# Patient Record
Sex: Male | Born: 1950 | Race: White | Hispanic: No | Marital: Married | Smoking: Former smoker
Health system: Southern US, Community
[De-identification: ages and names within clinical notes are randomized; demographics above are authoritative.]

## PROBLEM LIST (undated history)

## (undated) DIAGNOSIS — M1990 Unspecified osteoarthritis, unspecified site: Secondary | ICD-10-CM

## (undated) DIAGNOSIS — I10 Essential (primary) hypertension: Secondary | ICD-10-CM

## (undated) DIAGNOSIS — J302 Other seasonal allergic rhinitis: Secondary | ICD-10-CM

## (undated) DIAGNOSIS — K219 Gastro-esophageal reflux disease without esophagitis: Secondary | ICD-10-CM

## (undated) DIAGNOSIS — N433 Hydrocele, unspecified: Secondary | ICD-10-CM

## (undated) HISTORY — PX: BACK SURGERY: SHX140

## (undated) HISTORY — PX: APPENDECTOMY: SHX54

## (undated) HISTORY — PX: SEPTOPLASTY: SUR1290

## (undated) HISTORY — PX: DIAGNOSTIC LAPAROSCOPY: SUR761

## (undated) HISTORY — PX: HERNIA REPAIR: SHX51

---
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
## 1993-04-03 HISTORY — PX: CERVICAL DISC SURGERY: SHX588

## 1995-01-16 DIAGNOSIS — C4491 Basal cell carcinoma of skin, unspecified: Secondary | ICD-10-CM

## 1995-01-16 HISTORY — DX: Basal cell carcinoma of skin, unspecified: C44.91

## 2001-04-19 ENCOUNTER — Ambulatory Visit (HOSPITAL_COMMUNITY): Admission: RE | Admit: 2001-04-19 | Discharge: 2001-04-19 | Payer: Self-pay | Admitting: Gastroenterology

## 2002-10-13 DIAGNOSIS — C4491 Basal cell carcinoma of skin, unspecified: Secondary | ICD-10-CM

## 2002-10-13 HISTORY — DX: Basal cell carcinoma of skin, unspecified: C44.91

## 2004-04-20 DIAGNOSIS — D229 Melanocytic nevi, unspecified: Secondary | ICD-10-CM

## 2004-04-20 HISTORY — DX: Melanocytic nevi, unspecified: D22.9

## 2005-04-01 ENCOUNTER — Emergency Department (HOSPITAL_COMMUNITY): Admission: EM | Admit: 2005-04-01 | Discharge: 2005-04-01 | Payer: Self-pay | Admitting: Emergency Medicine

## 2006-04-03 HISTORY — PX: LUMBAR LAMINECTOMY: SHX95

## 2006-04-07 ENCOUNTER — Ambulatory Visit (HOSPITAL_COMMUNITY): Admission: RE | Admit: 2006-04-07 | Discharge: 2006-04-07 | Payer: Self-pay | Admitting: Gastroenterology

## 2006-12-25 ENCOUNTER — Ambulatory Visit (HOSPITAL_COMMUNITY): Admission: RE | Admit: 2006-12-25 | Discharge: 2006-12-26 | Payer: Self-pay | Admitting: Neurosurgery

## 2007-03-04 ENCOUNTER — Inpatient Hospital Stay (HOSPITAL_COMMUNITY): Admission: EM | Admit: 2007-03-04 | Discharge: 2007-03-05 | Payer: Self-pay | Admitting: Emergency Medicine

## 2008-02-12 ENCOUNTER — Encounter: Admission: RE | Admit: 2008-02-12 | Discharge: 2008-02-12 | Payer: Self-pay | Admitting: Gastroenterology

## 2008-04-03 HISTORY — PX: KNEE ARTHROSCOPY: SHX127

## 2008-04-05 ENCOUNTER — Emergency Department (HOSPITAL_COMMUNITY): Admission: EM | Admit: 2008-04-05 | Discharge: 2008-04-05 | Payer: Self-pay | Admitting: Family Medicine

## 2008-08-02 ENCOUNTER — Emergency Department (HOSPITAL_COMMUNITY): Admission: EM | Admit: 2008-08-02 | Discharge: 2008-08-02 | Payer: Self-pay | Admitting: Emergency Medicine

## 2008-11-21 ENCOUNTER — Encounter: Admission: RE | Admit: 2008-11-21 | Discharge: 2008-11-21 | Payer: Self-pay | Admitting: Gastroenterology

## 2008-12-24 ENCOUNTER — Ambulatory Visit (HOSPITAL_COMMUNITY): Admission: RE | Admit: 2008-12-24 | Discharge: 2008-12-24 | Payer: Self-pay | Admitting: Gastroenterology

## 2009-09-02 ENCOUNTER — Ambulatory Visit: Payer: Self-pay | Admitting: Vascular Surgery

## 2009-09-02 ENCOUNTER — Ambulatory Visit (HOSPITAL_COMMUNITY): Admission: RE | Admit: 2009-09-02 | Discharge: 2009-09-02 | Payer: Self-pay | Admitting: Orthopedic Surgery

## 2009-09-02 ENCOUNTER — Encounter (INDEPENDENT_AMBULATORY_CARE_PROVIDER_SITE_OTHER): Payer: Self-pay | Admitting: Orthopedic Surgery

## 2010-03-30 ENCOUNTER — Encounter
Admission: RE | Admit: 2010-03-30 | Discharge: 2010-03-30 | Payer: Self-pay | Source: Home / Self Care | Attending: Internal Medicine | Admitting: Internal Medicine

## 2010-08-16 NOTE — H&P (Signed)
Adrian [name], Adrian [name]                  ACCOUNT NO.:  [id number]   MEDICAL RECORD NO.:  [id number]          PATIENT TYPE:  OIB   LOCATION:  3036                         FACILITY:  [facility]   PHYSICIAN:  Payton Doughty, M.D.      DATE OF BIRTH:  Aug 02, 1950   DATE OF ADMISSION:  12/25/2006  DATE OF DISCHARGE:                              HISTORY & PHYSICAL   ADMISSION DIAGNOSIS:  Herniated disk at L3-4.   SERVICE:  Neurosurgery.   A very nice 60 year old left-handed white gentleman had back pain off  and on for a bit.  He has had a lumbar diskectomy done by Dr. Elesa Hacker at  5-1 in the remote past.  Starting in June he was doing a lot of work,  got on a plane, landed in [place] with discomfort down his right  leg.  Used oral steroids, had a couple of epidural injections for a 3-4  disk on the right, continues to have leg pain, and is now admitted for  diskectomy.   MEDICAL HISTORY:  Otherwise benign.  He is not any medications  save for  a Medrol Dosepak.  He took hydrocodone and Flexeril.   ALLERGIES:  None.   SURGICAL HISTORY:  Lumbar diskectomy.   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   FAMILY HISTORY:  None germane.   REVIEW OF SYSTEMS:  Remarkable for back and leg pain.   HEENT EXAM:  Within normal limits.  He has reasonable range of motion of  his neck.  CHEST:  Clear.  CARDIAC:  Regular rate and rhythm.  ABDOMEN:  Nontender with no hepatosplenomegaly.  EXTREMITIES:  Without clubbing or cyanosis.  GENITOURINARY:  Deferred.  PERIPHERAL PULSES:  Good.  NEUROLOGICAL:  He is awake, alert and oriented.  His cranial nerves are  intact.  Motor exam shows 5/5 strength throughout the upper and lower  extremities save for the dorsiflexors of the right foot, they are 4/5.  Sensory deficit described in the right L4 and L5 distribution.  Reflexes  are flicker at the left knee, absent at the right knee, flicker at the  right ankle,  absent at the left ankle.  Straight leg raise is negative  on the right.   MR demonstrates spondylosis at 4-5 and 5-1, but he also has a 3-4 disk  up on the right side behind the body of 3.   CLINICAL IMPRESSION:  Right-sided L4 and L5 radiculopathy secondary to 3-  4 disk.   The plan is for a right L3-4 laminectomy/diskectomy.  The risks and  benefits have been discussed with him and he wished to proceed.           ______________________________  Payton Doughty, M.D.     MWR/MEDQ  D:  12/25/2006  T:  12/26/2006  Job:  [id number]

## 2010-08-16 NOTE — H&P (Signed)
Adrian [name], Adrian [name]                  ACCOUNT NO.:  [id number]   MEDICAL RECORD NO.:  [id number]          PATIENT TYPE:  INP   LOCATION:  5712                         FACILITY:  [facility]   PHYSICIAN:  Kari Baars, M.D.  DATE OF BIRTH:  06-Feb-1951   DATE OF ADMISSION:  03/04/2007  DATE OF DISCHARGE:                              HISTORY & PHYSICAL   CHIEF COMPLAINT:  Swallowed 27% hydrogen peroxide.   HISTORY OF PRESENT ILLNESS:  Mr. Grieshop is a 60 year old white male with a  history of hypertension, chronic back pain secondary to degenerative  disk disease who presented to the emergency department today after  swallowing 27% hydrogen peroxide.  The patient states that he cleaned  his spa last evening with the hydrogen peroxide and thought he had  dumped the glass of liquid out.  This morning he took ibuprofen for his  back pain with a glass of clear liquid and experienced an explosion in  mouth.  Initially, he was not sure what he had drank but after EMS was  called they realized that it was the hydrogen peroxide that he had  failed to dispose of.  He was brought to the emergency department where  Dr. Ethelda Chick discussed the hydrogen peroxide exposure with Poison  Control.  They recommended evaluation for GI tract toxicity.  Dr. Kinnie Scales  was called and performed an endoscopy which revealed esophagitis and  fairly severe gastritis.  He recommended observation for 24 hours with  hydration and PPI therapy.  Currently, the patient is without complaint.  He states that his mouth feels numb as if it had been burned by hot  liquid.  He does not have any significant chest pain, abdominal pain,  nausea, vomiting or shortness of breath.   PAST MEDICAL HISTORY:  1. Degenerative disk disease status post L3-L4 diskectomy (September      2008) and L5-S1 diskectomy (1986) and cervical disk surgery in      1994.  2. Hypertension.  3. Hyperlipidemia.  4. Allergic rhinitis.  5. Status post  appendectomy (1999) complicated by postop hemorrhage.  6. Status post umbilical hernia repair.  7. Status post deviated septum repair.   CURRENT MEDICATIONS:  1. Diovan 80 mg daily.  2. Vicodin p.r.n.  3. Allegra 180 mg p.r.n.  4. Flonase 2 sprays daily p.r.n.   ALLERGIES:  No known drug allergies.   SOCIAL HISTORY:  He is married with two children.  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  He quit smoking in 1974.  Social alcohol use and  no illicit drug use.   FAMILY HISTORY:  Father died of leukemia at 80 and had a history of  hypertension and osteoarthritis.  His mother had breast cancer and died  at 61.  He has three brothers, one of whom had a stroke at 59.  His two  sons are healthy.   REVIEW OF SYSTEMS:  All systems reviewed with the patient are negative,  except in the HPI.   PHYSICAL  EXAMINATION:  VITAL SIGNS:  Temperature 97.8.  Pulse 86.  Respirations 20.  Blood pressure 133/96.  Oxygen saturation 96% on room  air.  GENERAL APPEARANCE:  A pleasant gentleman in no acute distress.  He is  alert and oriented x4.  HEENT:  Oropharynx is moist without erythema or apparent ulceration.  NECK:  Supple without lymphadenopathy, JVD or carotid bruits.  HEART:  Regular rate and rhythm without murmurs, rubs or gallops.  LUNGS:  Clear to auscultation bilaterally.  ABDOMEN:  Soft, nondistended and nontender with normoactive bowel  sounds.  EXTREMITIES:  No clubbing, cyanosis or edema.  No rash.   DIAGNOSTIC DATA:  Endoscopy report personally reviewed report and  pictures.  This reveals caustic esophagitis which is mild with erythema  expanding about 1 cm at the gastroesophageal junction.  In the stomach,  there is diffuse erythema and edema consistent with grade IIA gastritis.   ASSESSMENT AND PLAN:  1. Gastritis/esophagitis secondary to caustic burn from 27% hydrogen      peroxide:  We will admit for  24 observation to observe for      associated complications.  We will keep him n.p.o. with sips and      chips only.  We will place on Protonix 40 mg intravenous b.i.d. and      hydrate.  We will obtain an acute abdominal series to rule out      pulmonary or gastrointestinal related complications in the morning.      We will also check a CBC and a CMET in the morning.  2. Chronic back pain:  Morphine p.r.n. until he is tolerating p.o.  3. Disposition:  Anticipate discharge tomorrow if he is stable with no      apparent complications.  He will need follow up with Dr. Kinnie Scales in      the next couple of weeks to reevaluate and to rule out stricture      related to current abnormalities.      Kari Baars, M.D.  Electronically Signed     WS/MEDQ  D:  03/04/2007  T:  03/05/2007  Job:  [id number]   cc:   Griffith Citron, M.D.

## 2010-08-16 NOTE — Discharge Summary (Signed)
NAMEDANDRAE, KUSTRA                  ACCOUNT NO.:  [id number]   MEDICAL RECORD NO.:  [id number]          PATIENT TYPE:  INP   LOCATION:  5712                         FACILITY:  [facility]   PHYSICIAN:  Kari Baars, M.D.  DATE OF BIRTH:  1950-09-01   DATE OF ADMISSION:  03/04/2007  DATE OF DISCHARGE:  03/05/2007                               DISCHARGE SUMMARY   DISCHARGE DIAGNOSES:  1. Accidental hydrogen peroxide ingestion.  2. Esophagitis/gastritis secondary to #1.  3. Elevated bilirubin, question Gilberts' syndrome.  4. Hypertension   DISCHARGE MEDICATIONS:  1. Protonix 20 mg p.o. b.i.d. times 2 weeks, then 1 daily.  2. Phenergan 25 mg q.6 hours p.r.n. nausea.  3. Diovan 80 mg daily.  4. Hydrocodone/APAP 5/500 p.r.n. back pain.  5. Allegra 200 mg daily.   HISTORY OF PRESENT ILLNESS:  For full details, please see dictated  history and physical.  Briefly, Mr. Capek is a 60 year old white male  with history of hypertension and chronic back pain secondary to  degenerative disk disease status post multiple surgeries who presented  to the emergency department on March 04, 2007 following an accidental  ingestion of 27% hydrogen peroxide.  He states that he was cleaning his  spa on the night prior to the ingestion and had a glass of hydrogen  peroxide.  The following morning when taking an ibuprofen, he  experienced an explosion in his mouth.  He quickly realized that it  was by the hydrogen peroxide that he had ingested, about 2 mouthfuls.  He presented to the emergency department and after evaluation by Dr.  Ethelda Chick in the emergency department Dr. Kinnie Scales was consulted to  evaluate for caustic GI toxicity.  He underwent an endoscopy by Dr.  Kinnie Scales that showed esophageal inflammation at the distal  gastroesophageal junction about 1 cm with edema, which was mild.  In  addition, he had diffuse grade 2A gastritis throughout the fundus to  antrum.  It was recommended that he be  monitored overnight for  additional complications.   HOSPITAL COURSE:  The patient was admitted to a medical bed.  He was  placed on bowel rest overnight and a clear liquid diet on the morning of  discharge.  He tolerated this well without any significant abdominal  pain.  He did have a very mild discomfort in the epigastrium.  He has  had three bowel movements, the last of which was dark per the patient.  CBC done on the morning of discharge shows a hemoglobin of 13.7.  An  acute abdominal series was obtained to rule out aspiration pneumonitis  and showed no acute cardiopulmonary disease.  There were no other  concerning features on the abdominal folds.  At this point, the patient  is stable for discharge home to continue a clear liquid diet and PPI  therapy.  He will follow up with Dr. Kinnie Scales for repeat endoscopy in 2  weeks.   DISCHARGE INSTRUCTIONS:  He was instructed to call if he has increasing  abdominal pain, nausea, vomiting, hematemesis, persistent melena, or  shortness of breath.  LABORATORY DATA:  Discharge labs:  CBC on the day of discharge shows a  white count of 9.0, hemoglobin 13.7, platelets 252.  CMET shows sodium  141, potassium 3.4, chloride 99, bicarb 26, BUN 6, creatinine 0.76,  glucose 101.  Total bilirubin 2.3, alkaline phosphatase 46, ALT 33, AST  23.   HOSPITAL FOLLOWUP:  He will follow up with Dr. Kinnie Scales in 2 weeks.  He  will follow up with Dr. Clelia Croft in 1 week for repeat CBC and CMET.   DISCHARGE DIET:  Clear liquid diet, advance very slowly as tolerated.      Kari Baars, M.D.  Electronically Signed     WS/MEDQ  D:  03/05/2007  T:  03/05/2007  Job:  [id number]   cc:   Griffith Citron, M.D.

## 2010-08-16 NOTE — Op Note (Signed)
NAMEZIARE, CRYDER                  ACCOUNT NO.:  [id number]   MEDICAL RECORD NO.:  [id number]          PATIENT TYPE:  AMB   LOCATION:  SDS                          FACILITY:  [facility]   PHYSICIAN:  Payton Doughty, M.D.      DATE OF BIRTH:  [birth year]   DATE OF PROCEDURE:  12/25/2006  DATE OF DISCHARGE:                               OPERATIVE REPORT   PREOPERATIVE DIAGNOSIS:  Herniated disk on the right side at L3-4.   POSTOPERATIVE DIAGNOSIS:  Herniated disk on the right side at L3-4.   OPERATIVE PROCEDURE:  Laminectomy diskectomy on the right side at L3-4.   SURGEON:  Payton Doughty, M.D.   ANESTHESIA:  General endotracheal.   PREP:  Sterile Betadine prep and scrub with alcohol wipe.   COMPLICATIONS:  None.   NURSE ASSISTANT:  [place].   BODY OF TEXT:  This is a 60 year old gentleman with disk at 3-4 of the  right.  Taken to operating room, smoothly anesthetized, intubated,  placed prone on the operating table.  Following shave, prep, draped in  usual sterile fashion, needle was placed as a marker and x-ray obtained  to guide incision making.  Incision was made directly over the L3 lamina  and the lamina, 3-4 and 2-3 facet joints and the pars interarticularis  of L3 were identified.  Hook was placed in the L3 neural foramen which  is where the disk was located on the MRI.  X-ray was taken to confirm  correctness of level.  Having confirmed correctness level, working  laterally over the pars and into the lateral portion of the lamina, the  bone was drilled away and the ligamentum flavum uncovered.  Ligamentum  was removed.  The fragmented disk was immediately under the ligament  just medial to the most medial portion of the L3 root.  Disk was removed  without difficulty.  The root carefully explored and found to be free.  Wound was irrigated.  Hemostasis assured.  Depo-Medrol soaked fat used  to fill the laminectomy defect.  Successive layers of 0 Vicryl, 2-0  Vicryl, 4-0 Vicryl  used to close.  Benzoin, Steri-Strips were placed,  made occlusive with Telfa and OpSite and the patient returned to  recovery room in good condition.           ______________________________  Payton Doughty, M.D.     MWR/MEDQ  D:  12/25/2006  T:  12/26/2006  Job:  [id number]

## 2011-01-09 LAB — COMPREHENSIVE METABOLIC PANEL
ALT: 33
Alkaline Phosphatase: 46
BUN: 6
CO2: 26
Calcium: 8.8
GFR calc non Af Amer: >60
Glucose, Bld: 101 — ABNORMAL HIGH
Sodium: 141

## 2011-01-09 LAB — CBC
HCT: 39.9
Hemoglobin: 13.7
MCHC: 34.5
RBC: 4.54

## 2011-01-12 LAB — DIFFERENTIAL
Basophils Absolute: 0
Basophils Relative: 1
Eosinophils Relative: 2
Monocytes Absolute: 0.5

## 2011-01-12 LAB — COMPREHENSIVE METABOLIC PANEL
ALT: 24
AST: 22
Albumin: 3.6
Alkaline Phosphatase: 43
Chloride: 107
GFR calc Af Amer: >60
Potassium: 4.3
Total Bilirubin: 0.9

## 2011-01-12 LAB — CBC
HCT: 39.7
Platelets: 282
WBC: 5.5

## 2011-01-12 LAB — URINALYSIS, ROUTINE W REFLEX MICROSCOPIC
Bilirubin Urine: NEGATIVE
Glucose, UA: NEGATIVE
Ketones, ur: NEGATIVE
pH: 7

## 2011-01-12 LAB — ABO/RH: ABO/RH(D): O POS

## 2011-01-12 LAB — TYPE AND SCREEN

## 2011-05-17 ENCOUNTER — Encounter (HOSPITAL_BASED_OUTPATIENT_CLINIC_OR_DEPARTMENT_OTHER): Payer: Self-pay | Admitting: *Deleted

## 2011-05-17 NOTE — H&P (Signed)
[name] [address]   [address] [place], [place] [address] [phone] A Division of [facility]  Adrian [name], M.D.     Adrian [name], M.D.     Adrian [name], M.D. Adrian [name], M.D.    Adrian [name], M.D. Adrian [name], M.D. Adrian [name], D.O.          Adrian [name]. Adrian Dienes, PA-C            Adrian A. Shepperson, PA-C Adrian [name], OPA-C   RE: Adrian [name], Adrian [name]                                [id number]      DOB: Jan 13, 1951 PROGRESS NOTE: 04-25-11 SUBJECTIVE: Adrian [name] is a 61 year-old gentleman who comes into the office with complaints of neck and upper back pain.  Chronic back pain for many years.  His other complaint is with his right knee.  Symptoms there for two days after he went on a hike over the weekend.  No previous problems with the knee.  Generalized discomfort with the knee.  It is most troublesome to the inner side of his knee.  No catching or popping.  No swelling.  No giving way.  No instability.  No hip pain.  His more chronic complaint is neck and upper back pain that [name] been troublesome for many years.  It [name] been troublesome over the past several weeks with intermittent burning and radiating discomfort down each arm with tingling, particularly on the right.  No weakness.  Despite Ibuprofen, ice and/or heat, his symptoms have persisted.  Mild headache as well.  No previous treatment for his neck, but he does recall having physical therapy for his back at one point many years ago.  His medical history upon review shows he [name] no allergies.  No history of diabetes.  He [name] a history of hypertension.  His only medications being Diovan once daily.  Past surgical history is significant for lumbar diskectomy x 2, cervical diskectomy in the mid 1990's, left knee arthroscopy in 2010 and appendectomy.  Family history is significant for hypertension.  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      OBJECTIVE: Well-developed, well-nourished.  Height: 5?10.  Weight: 200 pounds.  Blood pressure: 160/82.  Exam of the cervical spine shows mild pain to flexion and extension.  Mildly restricted lateral rotation by 10% bilaterally with paracervical and trapezius spasm more prominent right than left.  Negative Spurling's to the left, mildly positive to the right.  Normal thoracic motion with normal scapular glide.  No midline tenderness to the cervical or thoracic spine.  He is neurovascularly intact upper extremities bilaterally.  Exam of the right knee shows tenderness over the medial joint line.  Positive medial McMurray's.  No effusion.  Stable to varus and valgus stress.  Negative Lachman.  No retropatellar grind.  He is neurovascularly intact distally.    X-RAYS: Three views of the right knee are unremarkable.  A paucity of underlying degenerative changes.  Two views of the cervical spine show marked diminished disc space height at C6-7.  Plain films of the thoracic spine are unremarkable.    ASSESSMENT: 1. Status [name] remote cervical diskectomy. 2. C6-7 DDD.  3. Bilateral cervical radiculopathy.  4. Right knee medial meniscus tear.   Continued  [name] [address]   [address] [place], [place] [address] [phone] A Division of [facility]  Adrian [name], M.D.     Adrian [name], M.D.     Adrian [name], M.D. Adrian [name], M.D.    Adrian [name], M.D. Adrian [name], M.D. Adrian [name], D.O.          Adrian [name]. Adrian Dienes, PA-C            Adrian A. Shepperson, PA-C Adrian [name], OPA-C   RE: Adrian [name], Adrian [name]                                [id number]      DOB: [birth year] PROGRESS NOTE: 04-25-11 PLAN: MRI of the cervical spine.  Intermittent Ibuprofen.  For his knee dilemma we discussed imaging studies, he wants to hold on that for now.  A 2:6 intraarticular injection with  Depo-Medrol/Marcaine is performed to the right knee.  Expected course after the injection explained to him.  Follow up after imaging studies to delineate further therapeutic recommendations.   PROCEDURE NOTE: The patient's clinical condition is marked by substantial pain and/or significant functional disability.  Other conservative therapy [name] not provided relief, is contraindicated, or not appropriate.  There is a reasonable likelihood that injection will significantly improve the patient's pain and/or functional disability. Patient is seated on the exam table.  The right knee is prepped with Betadine and alcohol and injected with 2:6 Depo-Medrol/Marcaine.  Patient tolerated the procedure without difficulty.   Adrian [name], M.D.   Electronically verified by Adrian [name], M.D. Adrian [name] D 04-26-11 T 04-27-11   [name] [address]   [address] [place], [place] [address] [phone] A Division of [facility]  Adrian [name], M.D.     Adrian [name], M.D.     Adrian [name], M.D. Adrian [name], M.D.    Adrian [name], M.D. Adrian [name], M.D. Adrian [name], D.O.          Adrian [name]. Adrian Dienes, PA-C            Adrian A. Shepperson, PA-C Adrian [name], OPA-C   RE: Adrian [name], Adrian [name]                                [id number]      DOB: November 22, 1950 PROGRESS NOTE: 05-16-11 Adrian [name] comes in for follow up.  Persistent significant symptoms, right knee.  Previously seen and evaluated by Dr. Farris [name].  Locking, popping and catching medial aspect.  Not bad as long as he is straight ahead, but anything with squatting and pivoting causes marked significant symptoms.  Previous x-rays which I have reviewed show some mild to moderate narrowing medial compartment, left knee.  The right looks well preserved in all compartments.  Left knee previously treated with arthroscopic debridement of meniscus tears by Dr. Thurston [name] back in 2011.  Although he  [name] some Grade II and III changes throughout the knee, he [name] done well with that debridement and really does not have significant symptoms there.  No real generalized arthritic symptoms on the right, but marked mechanical symptoms.  Recent MRI completed of the right knee which shows mild tricompartmental changes.  Posterior horn medial meniscus tear, as well as intrameniscal cyst involving the entire posterior third.  Intact ligaments.  Reactive effusion.  Some synovitis and medial patellar plica thickening.  I went over that report and scan and discussed it with him.   EXAMINATION: His general exam is outlined and included in the chart.  Specifically, point tender medial joint line on the right.  Positive McMurray's.  1+ reactive effusion.  Full motion, stable ligaments.  Neurovascularly intact distally.  On the left he [name] a little bit of varus with walking, but no joint line tenderness.  No meniscal signs.  Ligaments stable.     DISPOSITION:  Medial meniscus tear and meniscal cyst, right knee.  Medial plica.  Discussed definitive treatment.  Sufficient signs and symptoms to warrant that, especially mechanical in nature.  Exam under anesthesia, arthroscopy, care of his meniscus tear.  Anticipated procedure, risks, benefits, complications and recovered reviewed with him.  He works as an Tax inspector at [employer] and hopefully he can go back to work in as few as one week, but he realizes it is going to be a good 4-6 weeks before complete recovery.  He understands.  See him at the time of operative intervention.    Adrian [name], M.D.   Electronically verified by Adrian [name], M.D. DFM:jjh D 05-16-11 T 05-17-11

## 2011-05-17 NOTE — Progress Notes (Signed)
Called for pcp notes and ekg-needs istat am

## 2011-05-18 ENCOUNTER — Ambulatory Visit (HOSPITAL_BASED_OUTPATIENT_CLINIC_OR_DEPARTMENT_OTHER): Payer: 59 | Admitting: *Deleted

## 2011-05-18 ENCOUNTER — Encounter (HOSPITAL_BASED_OUTPATIENT_CLINIC_OR_DEPARTMENT_OTHER)
Admission: RE | Disposition: A | Discharge status: Home / Self Care | Payer: Self-pay | Source: Ambulatory Visit | Attending: Orthopedic Surgery

## 2011-05-18 ENCOUNTER — Ambulatory Visit (HOSPITAL_BASED_OUTPATIENT_CLINIC_OR_DEPARTMENT_OTHER)
Admission: RE | Admit: 2011-05-18 | Discharge: 2011-05-18 | Disposition: A | Discharge status: Home / Self Care | Payer: 59 | Source: Ambulatory Visit | Attending: Orthopedic Surgery | Admitting: Orthopedic Surgery

## 2011-05-18 ENCOUNTER — Encounter (HOSPITAL_BASED_OUTPATIENT_CLINIC_OR_DEPARTMENT_OTHER): Payer: Self-pay | Admitting: *Deleted

## 2011-05-18 DIAGNOSIS — K219 Gastro-esophageal reflux disease without esophagitis: Secondary | ICD-10-CM | POA: Insufficient documentation

## 2011-05-18 DIAGNOSIS — M23329 Other meniscus derangements, posterior horn of medial meniscus, unspecified knee: Secondary | ICD-10-CM | POA: Insufficient documentation

## 2011-05-18 DIAGNOSIS — M6750 Plica syndrome, unspecified knee: Secondary | ICD-10-CM | POA: Insufficient documentation

## 2011-05-18 DIAGNOSIS — Z4789 Encounter for other orthopedic aftercare: Secondary | ICD-10-CM

## 2011-05-18 DIAGNOSIS — I10 Essential (primary) hypertension: Secondary | ICD-10-CM | POA: Insufficient documentation

## 2011-05-18 HISTORY — DX: Essential (primary) hypertension: I10

## 2011-05-18 HISTORY — DX: Unspecified osteoarthritis, unspecified site: M19.90

## 2011-05-18 HISTORY — DX: Other seasonal allergic rhinitis: J30.2

## 2011-05-18 LAB — POCT I-STAT, CHEM 8
BUN: 15 mg/dL (ref 6–23)
Hemoglobin: 16 g/dL (ref 13.0–17.0)
Potassium: 4.2 mEq/L (ref 3.5–5.1)
Sodium: 140 mEq/L (ref 135–145)
TCO2: 19 mmol/L (ref 0–100)

## 2011-05-18 SURGERY — ARTHROSCOPY, KNEE, WITH MEDIAL MENISCECTOMY
Anesthesia: General | Site: Knee | Laterality: Right | Wound class: Clean

## 2011-05-18 MED ORDER — MORPHINE SULFATE 4 MG/ML IJ SOLN: 0.0500 mg/kg | INTRAMUSCULAR | Status: DC | PRN

## 2011-05-18 MED ORDER — PROPOFOL 10 MG/ML IV EMUL
INTRAVENOUS | Status: DC | PRN
  Administered 2011-05-18: 250 mg via INTRAVENOUS

## 2011-05-18 MED ORDER — LACTATED RINGERS IV SOLN
INTRAVENOUS | Status: DC
  Administered 2011-05-18 (×2): via INTRAVENOUS

## 2011-05-18 MED ORDER — ONDANSETRON HCL 4 MG/2ML IJ SOLN
INTRAMUSCULAR | Status: DC | PRN
  Administered 2011-05-18: 4 mg via INTRAVENOUS

## 2011-05-18 MED ORDER — CEFAZOLIN SODIUM 1-5 GM-% IV SOLN
INTRAVENOUS | Status: DC | PRN
  Administered 2011-05-18: 2 g via INTRAVENOUS

## 2011-05-18 MED ORDER — FENTANYL CITRATE 0.05 MG/ML IJ SOLN
INTRAMUSCULAR | Status: DC | PRN
  Administered 2011-05-18 (×2): 50 ug via INTRAVENOUS
  Administered 2011-05-18: 25 ug via INTRAVENOUS

## 2011-05-18 MED ORDER — METHYLPREDNISOLONE ACETATE PF 80 MG/ML IJ SUSP
INTRAMUSCULAR | Status: DC | PRN
  Administered 2011-05-18: 80 mg via INTRA_ARTICULAR

## 2011-05-18 MED ORDER — BUPIVACAINE HCL (PF) 0.5 % IJ SOLN
INTRAMUSCULAR | Status: DC | PRN
  Administered 2011-05-18: 20 mL via INTRA_ARTICULAR

## 2011-05-18 MED ORDER — MIDAZOLAM HCL 2 MG/2ML IJ SOLN: 0.5000 mg | INTRAMUSCULAR | Status: DC | PRN

## 2011-05-18 MED ORDER — DEXAMETHASONE SODIUM PHOSPHATE 4 MG/ML IJ SOLN
INTRAMUSCULAR | Status: DC | PRN
  Administered 2011-05-18: 10 mg via INTRAVENOUS

## 2011-05-18 MED ORDER — SODIUM CHLORIDE 0.9 % IR SOLN
Status: DC | PRN
  Administered 2011-05-18: 8000 mL

## 2011-05-18 MED ORDER — FENTANYL CITRATE 0.05 MG/ML IJ SOLN: 50.0000 ug | INTRAMUSCULAR | Status: DC | PRN

## 2011-05-18 MED ORDER — DROPERIDOL 2.5 MG/ML IJ SOLN
INTRAMUSCULAR | Status: DC | PRN
  Administered 2011-05-18: 0.625 mg via INTRAVENOUS

## 2011-05-18 MED ORDER — LIDOCAINE HCL (CARDIAC) 20 MG/ML IV SOLN
INTRAVENOUS | Status: DC | PRN
  Administered 2011-05-18: 40 mg via INTRAVENOUS

## 2011-05-18 MED ORDER — ACETAMINOPHEN 10 MG/ML IV SOLN
1000.0000 mg | Freq: Once | INTRAVENOUS | Status: AC
  Administered 2011-05-18: 1000 mg via INTRAVENOUS

## 2011-05-18 MED ORDER — OXYCODONE HCL 5 MG PO TABS
10.0000 mg | ORAL_TABLET | ORAL | Status: AC | PRN
  Administered 2011-05-18: 10 mg via ORAL

## 2011-05-18 MED ORDER — METOCLOPRAMIDE HCL 5 MG/ML IJ SOLN: 10.0000 mg | Freq: Once | INTRAMUSCULAR | Status: DC | PRN

## 2011-05-18 MED ORDER — FENTANYL CITRATE 0.05 MG/ML IJ SOLN
25.0000 ug | INTRAMUSCULAR | Status: DC | PRN
  Administered 2011-05-18 (×2): 50 ug via INTRAVENOUS

## 2011-05-18 SURGICAL SUPPLY — 39 items
BANDAGE ELASTIC 6 VELCRO ST LF (GAUZE/BANDAGES/DRESSINGS) ×2 IMPLANT
BLADE CUDA 5.5 (BLADE) IMPLANT
BLADE CUDA GRT WHITE 3.5 (BLADE) IMPLANT
BLADE CUTTER GATOR 3.5 (BLADE) ×2 IMPLANT
BLADE CUTTER MENIS 5.5 (BLADE) IMPLANT
BLADE GREAT WHITE 4.2 (BLADE) ×2 IMPLANT
BUR OVAL 4.0 (BURR) IMPLANT
CANISTER OMNI JUG 16 LITER (MISCELLANEOUS) ×2 IMPLANT
CANISTER SUCTION 2500CC (MISCELLANEOUS) IMPLANT
CLOTH BEACON ORANGE TIMEOUT ST (SAFETY) ×2 IMPLANT
CUTTER MENISCUS  4.2MM (BLADE)
CUTTER MENISCUS 4.2MM (BLADE) IMPLANT
DRAPE ARTHROSCOPY W/POUCH 90 (DRAPES) ×2 IMPLANT
DRSG PAD ABDOMINAL 8X10 ST (GAUZE/BANDAGES/DRESSINGS) ×1 IMPLANT
DURAPREP 26ML APPLICATOR (WOUND CARE) ×2 IMPLANT
ELECT MENISCUS 165MM 90D (ELECTRODE) ×1 IMPLANT
ELECT REM PT RETURN 9FT ADLT (ELECTROSURGICAL) ×2
ELECTRODE REM PT RTRN 9FT ADLT (ELECTROSURGICAL) IMPLANT
GAUZE XEROFORM 1X8 LF (GAUZE/BANDAGES/DRESSINGS) ×2 IMPLANT
GLOVE BIO SURGEON STRL SZ 6.5 (GLOVE) ×1 IMPLANT
GLOVE BIOGEL PI IND STRL 7.0 (GLOVE) IMPLANT
GLOVE BIOGEL PI IND STRL 8 (GLOVE) ×1 IMPLANT
GLOVE BIOGEL PI INDICATOR 7.0 (GLOVE) ×1
GLOVE BIOGEL PI INDICATOR 8 (GLOVE) ×1
GLOVE ORTHO TXT STRL SZ7.5 (GLOVE) ×4 IMPLANT
GOWN PREVENTION PLUS XLARGE (GOWN DISPOSABLE) ×3 IMPLANT
GOWN STRL REIN 2XL XLG LVL4 (GOWN DISPOSABLE) ×1 IMPLANT
HOLDER KNEE FOAM BLUE (MISCELLANEOUS) ×2 IMPLANT
KNEE WRAP E Z 3 GEL PACK (MISCELLANEOUS) ×1 IMPLANT
PACK ARTHROSCOPY DSU (CUSTOM PROCEDURE TRAY) ×2 IMPLANT
PACK BASIN DAY SURGERY FS (CUSTOM PROCEDURE TRAY) ×2 IMPLANT
PENCIL BUTTON HOLSTER BLD 10FT (ELECTRODE) ×1 IMPLANT
SET ARTHROSCOPY TUBING (MISCELLANEOUS) ×2
SET ARTHROSCOPY TUBING LN (MISCELLANEOUS) ×1 IMPLANT
SPONGE GAUZE 4X4 12PLY (GAUZE/BANDAGES/DRESSINGS) ×3 IMPLANT
SUT ETHILON 3 0 PS 1 (SUTURE) ×2 IMPLANT
SUT VIC AB 3-0 FS2 27 (SUTURE) IMPLANT
TOWEL OR 17X24 6PK STRL BLUE (TOWEL DISPOSABLE) ×2 IMPLANT
WATER STERILE IRR 1000ML POUR (IV SOLUTION) ×2 IMPLANT

## 2011-05-18 NOTE — Anesthesia Postprocedure Evaluation (Signed)
  Anesthesia Post-op Note  Patient: Adrian [name]  Procedure(s) Performed: Procedure(s) (LRB): KNEE ARTHROSCOPY WITH MEDIAL MENISECTOMY (Right)  Patient Location: PACU  Anesthesia Type: GA combined with regional for post-op pain  Level of Consciousness: awake  Airway and Oxygen Therapy: Patient Spontanous Breathing  Post-op Pain: none  Post-op Assessment: Post-op Vital signs reviewed  Post-op Vital Signs: stable  Complications: No apparent anesthesia complications

## 2011-05-18 NOTE — Anesthesia Procedure Notes (Signed)
Procedure Name: LMA Insertion Date/Time: 05/18/2011 11:54 AM Performed by: Meyer Russel Pre-anesthesia Checklist: Patient identified, Emergency Drugs available, Suction available, Patient being monitored and Timeout performed Patient Re-evaluated:Patient Re-evaluated prior to inductionOxygen Delivery Method: Circle System Utilized Preoxygenation: Pre-oxygenation with 100% oxygen Intubation Type: IV induction Ventilation: Mask ventilation without difficulty LMA: LMA inserted LMA Size: 5.0 Number of attempts: 1 Airway Equipment and Method: bite block Placement Confirmation: positive ETCO2 Tube secured with: Tape Dental Injury: Teeth and Oropharynx as per pre-operative assessment

## 2011-05-18 NOTE — Transfer of Care (Signed)
Immediate Anesthesia Transfer of Care Note  Patient: Adrian [name]  Procedure(s) Performed: Procedure(s) (LRB): KNEE ARTHROSCOPY WITH MEDIAL MENISECTOMY (Right)  Patient Location: PACU  Anesthesia Type: General  Level of Consciousness: awake, alert  and patient cooperative  Airway & Oxygen Therapy: Patient Spontanous Breathing and Patient connected to face mask oxygen  Post-op Assessment: Report given to PACU RN, Post -op Vital signs reviewed and stable and Patient moving all extremities  Post vital signs: Reviewed and stable  Complications: No apparent anesthesia complications

## 2011-05-18 NOTE — Brief Op Note (Signed)
05/18/2011  12:54 PM  PATIENT:  Sondra Barges  61 y.o. male  PRE-OPERATIVE DIAGNOSIS:  right knee medial meniscus tear  POST-OPERATIVE DIAGNOSIS:  right knee medial and lateral meniscus tear, plica  PROCEDURE:  Procedure(s) (LRB): KNEE ARTHROSCOPY WITH MEDIAL MENISECTOMY (Right), plica excision  SURGEON:  Surgeon(s) and Role:    * Loreta Ave, MD - Primary  PHYSICIAN ASSISTANT: Zonia Kief M   ANESTHESIA:   general  EBL:  Total I/O In: 1300 [I.V.:1300] Out: -    SPECIMEN:  No Specimen  DISPOSITION OF SPECIMEN:  N/A  TOURNIQUET:  * No tourniquets in log *   PATIENT DISPOSITION:  PACU - hemodynamically stable.

## 2011-05-18 NOTE — Anesthesia Preprocedure Evaluation (Addendum)
Anesthesia Evaluation  Patient identified by MRN, date of birth, ID band Patient awake    Reviewed: Allergy & Precautions, H&P , NPO status , Patient's Chart, lab work & pertinent test results, reviewed documented beta blocker date and time   Airway Mallampati: II TM Distance: >3 FB Neck ROM: full    Dental   Pulmonary neg pulmonary ROS,          Cardiovascular hypertension, On Medications     Neuro/Psych Negative Neurological ROS  Negative Psych ROS   GI/Hepatic negative GI ROS, Neg liver ROS, GERD-  Medicated and Controlled,  Endo/Other  Negative Endocrine ROS  Renal/GU negative Renal ROS  Genitourinary negative   Musculoskeletal   Abdominal   Peds  Hematology negative hematology ROS (+)   Anesthesia Other Findings See surgeon's H&P   Reproductive/Obstetrics negative OB ROS                          Anesthesia Physical Anesthesia Plan  ASA: II  Anesthesia Plan: General   Post-op Pain Management:    Induction: Intravenous  Airway Management Planned: LMA  Additional Equipment:   Intra-op Plan:   Post-operative Plan: Extubation in OR  Informed Consent: I have reviewed the patients History and Physical, chart, labs and discussed the procedure including the risks, benefits and alternatives for the proposed anesthesia with the patient or authorized representative who has indicated his/her understanding and acceptance.   Dental Advisory Given  Plan Discussed with: CRNA and Surgeon  Anesthesia Plan Comments:        Anesthesia Quick Evaluation

## 2011-05-18 NOTE — Interval H&P Note (Signed)
History and Physical Interval Note:  05/18/2011 7:39 AM  Adrian [name]  has presented today for surgery, with the diagnosis of right knee mmt  The various methods of treatment have been discussed with the patient and family. After consideration of risks, benefits and other options for treatment, the patient has consented to  Procedure(s) (LRB): KNEE ARTHROSCOPY WITH MEDIAL MENISECTOMY (Right) as a surgical intervention .  The patients' history has been reviewed, patient examined, no change in status, stable for surgery.  I have reviewed the patients' chart and labs.  Questions were answered to the patient's satisfaction.     Rilen Shukla F

## 2011-05-18 NOTE — Discharge Instructions (Signed)

## 2011-05-19 NOTE — Op Note (Signed)
NAME:  ROBERTSON, COLCLOUGH NO.:  MEDICAL RECORD NO.:  [id number]  LOCATION:                                 FACILITY:  PHYSICIAN:  Loreta Ave, M.D.      DATE OF BIRTH:  DATE OF PROCEDURE:  05/18/2011 DATE OF DISCHARGE:                              OPERATIVE REPORT   PREOPERATIVE DIAGNOSIS:  Medial meniscus tear with posterior horn intrameniscal cyst, right knee.  POSTOPERATIVE DIAGNOSIS:  Medial meniscus tear with posterior horn intrameniscal cyst, right knee with also radial tearing, anterior middle aspect, lateral meniscus.  Grade 3 changes patellofemoral joint focally, lateral femoral condyle.  Large fibrotic medial plica.  PROCEDURE:  Right knee exam under anesthesia, arthroscopy.  Debrided medial lateral meniscus.  Chondroplasty of patellofemoral joint and lateral femoral condyle.  Excision of medial plica.  SURGEON:  Loreta Ave, MD  ASSISTANT:  Genene Churn. Barry Dienes, [place]  ANESTHESIA:  General.  BLOOD LOSS:  Minimal.  SPECIMENS:  None.  CULTURES:  None.  COMPLICATIONS:  None.  DRESSINGS:  Soft compressive.  TOURNIQUET:  Not employed.  PROCEDURE:  The patient was brought to the operating room, placed on the operating table in supine position.  After adequate anesthesia had been obtained, knee examined.  Good motion, good stability.  Leg holder applied.  Leg prepped and draped in usual sterile fashion.  Two portals, 1 each medial and lateral parapatellar.  Arthroscope introduced.  Knee was distended and inspected.  Diffuse grade 3 changes of patellofemoral joint, mostly on the trochlea.  Large fibrotic plica extending medial about third, the way across the joint with abrasive changes on the condyle below.  Patellofemoral joint debrided.  Plica completely excised.  Hemostasis cautery.  Cruciate ligament was intact.  Medial compartment, some mild grade 2 changes otherwise looked fairly good. Complex tearing posterior third.  Saucerized  out to a stable rim excising the intrameniscal cyst in the posterior third.  Laterally, radial tearing of the middle and anterior portion of the meniscus, debrided out, tapered in smoothly.  Debrided the grade 3 lesion on the condyle, which was very small and anterior.  The entire knee examined. No other findings appreciated. Instruments were fully removed.  Knee injected with Depo-Medrol, Marcaine.  Portals were closed with nylon.  Sterile compressive dressing applied.  Anesthesia reversed.  Brought to recovery room.  Tolerated surgery well.  No complications.     Loreta Ave, M.D.     DFM/MEDQ  D:  05/18/2011  T:  05/19/2011  Job:  [id number]

## 2012-06-10 ENCOUNTER — Emergency Department (HOSPITAL_COMMUNITY)
Admission: EM | Admit: 2012-06-10 | Discharge: 2012-06-10 | Discharge status: Against Medical Advice | Payer: 59 | Attending: Emergency Medicine | Admitting: Emergency Medicine

## 2012-06-10 ENCOUNTER — Encounter (HOSPITAL_COMMUNITY): Payer: Self-pay | Admitting: Adult Health

## 2012-06-10 DIAGNOSIS — W57XXXA Bitten or stung by nonvenomous insect and other nonvenomous arthropods, initial encounter: Secondary | ICD-10-CM | POA: Insufficient documentation

## 2012-06-10 DIAGNOSIS — S60469A Insect bite (nonvenomous) of unspecified finger, initial encounter: Secondary | ICD-10-CM | POA: Insufficient documentation

## 2012-06-10 DIAGNOSIS — M1990 Unspecified osteoarthritis, unspecified site: Secondary | ICD-10-CM | POA: Insufficient documentation

## 2012-06-10 DIAGNOSIS — Y9389 Activity, other specified: Secondary | ICD-10-CM | POA: Insufficient documentation

## 2012-06-10 DIAGNOSIS — I10 Essential (primary) hypertension: Secondary | ICD-10-CM | POA: Insufficient documentation

## 2012-06-10 DIAGNOSIS — Y929 Unspecified place or not applicable: Secondary | ICD-10-CM | POA: Insufficient documentation

## 2012-06-10 DIAGNOSIS — R209 Unspecified disturbances of skin sensation: Secondary | ICD-10-CM | POA: Insufficient documentation

## 2012-06-10 HISTORY — DX: Unspecified osteoarthritis, unspecified site: M19.90

## 2012-06-10 NOTE — ED Notes (Signed)
The pt has decided to leave he  Will rerturn if he feels worse

## 2012-06-10 NOTE — ED Notes (Signed)
Presents with possible black widow bite that occurred at 1930 this evening on right pointer finger. No mark noted, pt denies pain, reports numbness in right fingers, but states that happens from time to with DJD.  Denies SOB and pain.

## 2012-07-25 ENCOUNTER — Encounter (HOSPITAL_COMMUNITY): Payer: Self-pay | Admitting: Cardiology

## 2012-07-25 ENCOUNTER — Emergency Department (HOSPITAL_COMMUNITY): Payer: 59

## 2012-07-25 ENCOUNTER — Other Ambulatory Visit: Payer: Self-pay

## 2012-07-25 ENCOUNTER — Emergency Department (HOSPITAL_COMMUNITY)
Admission: EM | Admit: 2012-07-25 | Discharge: 2012-07-25 | Disposition: A | Discharge status: Home / Self Care | Payer: 59 | Attending: Emergency Medicine | Admitting: Emergency Medicine

## 2012-07-25 DIAGNOSIS — Z8739 Personal history of other diseases of the musculoskeletal system and connective tissue: Secondary | ICD-10-CM | POA: Insufficient documentation

## 2012-07-25 DIAGNOSIS — IMO0002 Reserved for concepts with insufficient information to code with codable children: Secondary | ICD-10-CM | POA: Insufficient documentation

## 2012-07-25 DIAGNOSIS — M129 Arthropathy, unspecified: Secondary | ICD-10-CM | POA: Insufficient documentation

## 2012-07-25 DIAGNOSIS — Z79899 Other long term (current) drug therapy: Secondary | ICD-10-CM | POA: Insufficient documentation

## 2012-07-25 DIAGNOSIS — R0789 Other chest pain: Secondary | ICD-10-CM | POA: Insufficient documentation

## 2012-07-25 DIAGNOSIS — I10 Essential (primary) hypertension: Secondary | ICD-10-CM | POA: Insufficient documentation

## 2012-07-25 DIAGNOSIS — R42 Dizziness and giddiness: Secondary | ICD-10-CM | POA: Insufficient documentation

## 2012-07-25 DIAGNOSIS — R079 Chest pain, unspecified: Secondary | ICD-10-CM

## 2012-07-25 DIAGNOSIS — M549 Dorsalgia, unspecified: Secondary | ICD-10-CM | POA: Insufficient documentation

## 2012-07-25 LAB — BASIC METABOLIC PANEL
Chloride: 103 mEq/L (ref 96–112)
Creatinine, Ser: 0.75 mg/dL (ref 0.50–1.35)
GFR calc Af Amer: >90 mL/min (ref >90)
Potassium: 3.6 mEq/L (ref 3.5–5.1)
Sodium: 138 mEq/L (ref 135–145)

## 2012-07-25 LAB — POCT I-STAT TROPONIN I: Troponin i, poc: 0 ng/mL (ref 0.00–0.08)

## 2012-07-25 LAB — CBC
MCV: 79.8 fL (ref 78.0–100.0)
Platelets: 236 10*3/uL (ref 150–400)
RBC: 5.25 MIL/uL (ref 4.22–5.81)
RDW: 13.8 % (ref 11.5–15.5)
WBC: 6.9 10*3/uL (ref 4.0–10.5)

## 2012-07-25 MED ORDER — ASPIRIN 81 MG PO CHEW
324.0000 mg | CHEWABLE_TABLET | Freq: Once | ORAL | Status: AC
  Administered 2012-07-25: 324 mg via ORAL
  Filled 2012-07-25: qty 4

## 2012-07-25 NOTE — ED Provider Notes (Signed)
History     CSN: [id number]  Arrival date & time 07/25/12  1316   First MD Initiated Contact with Patient 07/25/12 1328      Chief Complaint  Patient presents with  . Chest Pain    (Consider location/radiation/quality/duration/timing/severity/associated sxs/prior treatment) HPI Adrian [name] is a 62 y.o. male who presents to ED with complaint of chest pain. Pain is sharp, 7/10, retrosternal. 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 States he went to his office, sat down for "a little bit" and states felt dizzy, states pain resolved but dizziness lasted about 2 hrs. . No associated nausea, or diaphoresis. States no hx of exertional pain or shortness of breath. No hx of cardiac disease. No current pain. States hx of hypertension, no other medical problems. Has not taken anything for this problem. States hx of degenerative disk disease and felt like this was caused by a nerve pain radiating from his back. States has had similar episode before but never got dizzy like today.    Past Medical History  Diagnosis Date  . Hypertension   . Arthritis   . Seasonal allergies   . DJD (degenerative joint disease)     Past Surgical History  Procedure Laterality Date  . Cervical disc surgery  1995  . Lumbar laminectomy  2008  . Appendectomy      hemorrhaged post op  . Hernia repair      umbilical  . Septoplasty    . Knee arthroscopy  2010    lt  . Back surgery      History reviewed. No pertinent family history.  History  Substance Use Topics  . Smoking status: Never Smoker   . Smokeless tobacco: Not on file  . Alcohol Use: Yes      Review of Systems  Constitutional: Negative for fever and chills.  HENT: Negative for neck pain and neck stiffness.   Respiratory: Positive for chest tightness. Negative for shortness of breath.   Cardiovascular: Positive for chest pain. Negative for palpitations and  leg swelling.  Gastrointestinal: Negative for nausea, vomiting and abdominal pain.  Genitourinary: Negative for dysuria and flank pain.  Musculoskeletal: Positive for back pain.  Neurological: Positive for dizziness and light-headedness.  All other systems reviewed and are negative.    Allergies  Review of patient's allergies indicates no known allergies.  Home Medications   Current Outpatient Rx  Name  Route  Sig  Dispense  Refill  . acetaminophen (TYLENOL) 500 MG tablet   Oral   Take 500 mg by mouth every 6 (six) hours as needed for pain.         . fexofenadine (ALLEGRA) 180 MG tablet   Oral   Take 180 mg by mouth daily.         . fluticasone (FLONASE) 50 MCG/ACT nasal spray   Nasal   Place 2 sprays into the nose daily as needed for rhinitis or allergies.         . valsartan (DIOVAN) 80 MG tablet   Oral   Take 80 mg by mouth daily.           BP 144/87  Pulse 95  Temp(Src) 98.1 F (36.7 C) (Oral)  Resp 20  SpO2 95%  Physical Exam  Nursing note and vitals reviewed. Constitutional: He is oriented to person, place, and time. He appears well-developed and well-nourished. No distress.  HENT:  Head:  Normocephalic.  Eyes: Conjunctivae are normal.  Neck: Neck supple.  Cardiovascular: Normal rate, regular rhythm and normal heart sounds.   Pulmonary/Chest: Effort normal and breath sounds normal. No respiratory distress. He has no wheezes. He has no rales. He exhibits no tenderness.  Abdominal: Soft. Bowel sounds are normal. He exhibits no distension. There is no tenderness. There is no rebound and no guarding.  Musculoskeletal: He exhibits no edema.  Neurological: He is alert and oriented to person, place, and time.  Skin: Skin is warm and dry.    ED Course  Procedures (including critical care time)   Date: 07/25/2012  Rate: 89  Rhythm: normal sinus rhythm  QRS Axis: normal  Intervals: normal  ST/T Wave abnormalities: normal  Conduction  Disutrbances:incoplete right bundle branch block  Narrative Interpretation:   Old EKG Reviewed: none available   Results for orders placed during the hospital encounter of 07/25/12  CBC      Result Value Range   WBC 6.9  4.0 - 10.5 K/uL   RBC 5.25  4.22 - 5.81 MIL/uL   Hemoglobin 14.4  13.0 - 17.0 g/dL   HCT 16.1  09.6 - 04.5 %   MCV 79.8  78.0 - 100.0 fL   MCH 27.4  26.0 - 34.0 pg   MCHC 34.4  30.0 - 36.0 g/dL   RDW 40.9  81.1 - 91.4 %   Platelets 236  150 - 400 K/uL  BASIC METABOLIC PANEL      Result Value Range   Sodium 138  135 - 145 mEq/L   Potassium 3.6  3.5 - 5.1 mEq/L   Chloride 103  96 - 112 mEq/L   CO2 26  19 - 32 mEq/L   Glucose, Bld 171 (*) 70 - 99 mg/dL   BUN 11  6 - 23 mg/dL   Creatinine, Ser 7.82  0.50 - 1.35 mg/dL   Calcium 9.7  8.4 - 95.6 mg/dL   GFR calc non Af Amer >90  >90 mL/min   GFR calc Af Amer >90  >90 mL/min  POCT I-STAT TROPONIN I      Result Value Range   Troponin i, poc 0.00  0.00 - 0.08 ng/mL   Comment 3            Dg Chest 2 View  07/25/2012  *RADIOLOGY REPORT*  Clinical Data: Chest pain  CHEST - 2 VIEW  Comparison: 03/04/2007  Findings: The heart and pulmonary vascularity are within normal limits.  The lungs are clear bilaterally.  No acute bony abnormality is seen.  Persistent degenerative changes are noted.  IMPRESSION: No acute abnormality noted.   Original Report Authenticated By: Alcide Clever, M.D.     4:32 PM D dimer negative. Pt symptom free. Discussed results, plan to d/c home with close follow up with PCP.   1. Chest pain       MDM  PT with atypical episode of chest pain onset today at rest. Symptoms associated with dizziness. Currently asymptomatic. Reports some back pain. Pain is sharp. No sob, diaphoresis. Pt's labs unremarkable other than blood glucose of 171. This discussed with pt. Will follow up closely with Dr. Clelia Croft. His pain is atypical, doubt ACS, ecg and troponin negative. D dimer negative, doubt PE or dissection. CXR  normal. Pt is low risk. Will  Be d/c home with close follow up. Return if symptoms are worsening.   Filed Vitals:   07/25/12 1421 07/25/12 1430 07/25/12 1445 07/25/12 1500  BP:  140/89  138/86 150/84  Pulse: 92 92 86 88  Temp:      TempSrc:      Resp: 13 22 12 23   SpO2: 98% 96% 97% 100%          Lottie Mussel, PA-C 07/25/12 1635

## 2012-07-25 NOTE — ED Provider Notes (Signed)
62 year old male with history of hypertension well-controlled on angiotensin receptor blocker, no history of smoking, cholesterol, diabetes or family history who presents with a complaint of a lower sternal sharp chest pain lasting 10 seconds and then resolving, recurrent for 3 seconds and then resolved again. He had associated lightheadedness and dizziness which is now resolved as well. He denies any swelling of the legs, shortness of breath, cough, fever, trauma, travel, immobilization, surgery, exogenous estrogen use or tobacco use. On exam the patient has clear heart and lung sounds, normal legs, no signs of asymmetry or edema, no murmurs, normal pulses and normal mental status. Vital signs are normal other than mild hypertension at 150/82, EKG is totally normal, labs thus far been normal, d-dimer pending.  Doubt cardiac disease, presentation suggests benign etiology if not dissection or pulmonary embolism  Medical screening examination/treatment/procedure(s) were conducted as a shared visit with non-physician practitioner(s) and myself.  I personally evaluated the patient during the encounter    Vida Roller, MD 07/26/12 [phone]

## 2012-07-25 NOTE — ED Notes (Signed)
Pt reports about an hour he had a sharp pain at the end of his sternum and had some SOB. States he has a hx of a pinched nerve. Denies any n/v. Reports some tightness in the chest of his back. Denies any pain at this time.

## 2012-07-26 NOTE — ED Provider Notes (Signed)
  Medical screening examination/treatment/procedure(s) were conducted as a shared visit with non-physician practitioner(s) and myself.  I personally evaluated the patient during the encounter  Please see my separate respective documentation pertaining to this patient encounter   Vida Roller, MD 07/26/12 [phone]

## 2015-07-31 ENCOUNTER — Ambulatory Visit (HOSPITAL_COMMUNITY)
Admission: EM | Admit: 2015-07-31 | Discharge: 2015-07-31 | Disposition: A | Discharge status: Home / Self Care | Payer: Medicare Other | Attending: Emergency Medicine | Admitting: Emergency Medicine

## 2015-07-31 ENCOUNTER — Encounter (HOSPITAL_COMMUNITY): Payer: Self-pay | Admitting: Emergency Medicine

## 2015-07-31 DIAGNOSIS — K0889 Other specified disorders of teeth and supporting structures: Secondary | ICD-10-CM

## 2015-07-31 MED ORDER — CLINDAMYCIN HCL 300 MG PO CAPS: 300.0000 mg | ORAL_CAPSULE | Freq: Three times a day (TID) | ORAL | Status: DC

## 2015-07-31 NOTE — ED Notes (Signed)
Pt d/c by frank patrick, pa

## 2015-07-31 NOTE — ED Notes (Signed)
C/o dental pain on right side onset yest... Reports pain radiates to right ear  A&O x4... No acute distress.

## 2015-07-31 NOTE — ED Provider Notes (Signed)
CSN: [id number]     Arrival date & time 07/31/15  1454 History   First MD Initiated Contact with Patient 07/31/15 1526     Chief Complaint  Patient presents with  . Dental Pain   (Consider location/radiation/quality/duration/timing/severity/associated sxs/prior Treatment) HPI History obtained from patient:  Pt presents with the cc of: dental pain Duration of symptoms:2 days Treatment prior to arrival:tylenol Context: sudden onset of tenderness after eating Other symptoms include: ear and jaw pain.  Pain score:4 FAMILY HISTORY:dental caries in father SOCIAL HISTORY: Non smoker    Past Medical History  Diagnosis Date  . Hypertension   . Arthritis   . Seasonal allergies   . DJD (degenerative joint disease)    Past Surgical History  Procedure Laterality Date  . Cervical disc surgery  1995  . Lumbar laminectomy  2008  . Appendectomy      hemorrhaged post op  . Hernia repair      umbilical  . Septoplasty    . Knee arthroscopy  2010    lt  . Back surgery     No family history on file. Social History  Substance Use Topics  . Smoking status: Never Smoker   . Smokeless tobacco: None  . Alcohol Use: Yes    Review of Systems ROS +'vedental pain, ear pain, jaw swelling  Denies: HEADACHE, NAUSEA, ABDOMINAL PAIN, CHEST PAIN, CONGESTION, DYSURIA, SHORTNESS OF BREATH  Allergies  Review of patient's allergies indicates no known allergies.  Home Medications   Prior to Admission medications   Medication Sig Start Date End Date Taking? Authorizing Provider  fexofenadine (ALLEGRA) 180 MG tablet Take 180 mg by mouth daily.   Yes Historical Provider, MD  hydrochlorothiazide (HYDRODIURIL) 12.5 MG tablet Take 12.5 mg by mouth daily.   Yes Historical Provider, MD  valsartan (DIOVAN) 80 MG tablet Take 80 mg by mouth daily.   Yes Historical Provider, MD  acetaminophen (TYLENOL) 500 MG tablet Take 500 mg by mouth every 6 (six) hours as needed for pain.    Historical Provider, MD   clindamycin (CLEOCIN) 300 MG capsule Take 1 capsule (300 mg total) by mouth 3 (three) times daily. 07/31/15   Konrad Felix, PA  fluticasone (FLONASE) 50 MCG/ACT nasal spray Place 2 sprays into the nose daily as needed for rhinitis or allergies.    Historical Provider, MD   Meds Ordered and Administered this Visit  Medications - No data to display  BP 143/77 mmHg  Pulse 83  Temp(Src) 99.8 F (37.7 C) (Oral)  Resp 14  SpO2 100% No data found.   Physical Exam NURSES NOTES AND VITAL SIGNS REVIEWED. CONSTITUTIONAL: Well developed, well nourished, no acute distress HEENT: normocephalic, atraumatic tooth #13 tender, with cap, some swelling of gingiva. No palpable abscess.  EYES: Conjunctiva normal NECK:normal ROM, supple, no adenopathy PULMONARY:No respiratory distress, normal effort ABDOMINAL: Soft, ND, NT BS+, No CVAT MUSCULOSKELETAL: Normal ROM of all extremities,  SKIN: warm and dry without rash PSYCHIATRIC: Mood and affect, behavior are normal  ED Course  Procedures (including critical care time)  Labs Review Labs Reviewed - No data to display  Imaging Review No results found.   Visual Acuity Review  Right Eye Distance:   Left Eye Distance:   Bilateral Distance:    Right Eye Near:   Left Eye Near:    Bilateral Near:      RX clinda   MDM   1. Pain, dental     Patient is reassured that there are no  issues that require transfer to higher level of care at this time or additional tests. Patient is advised to continue home symptomatic treatment. Patient is advised that if there are new or worsening symptoms to attend the emergency department, contact primary care provider, or return to [facility]. Instructions of care provided discharged home in stable condition.    THIS NOTE WAS GENERATED USING A VOICE RECOGNITION SOFTWARE PROGRAM. ALL REASONABLE EFFORTS  WERE MADE TO PROOFREAD THIS DOCUMENT FOR ACCURACY.  I have verbally reviewed the discharge instructions with  the patient. A printed AVS was given to the patient.  All questions were answered prior to discharge.      Konrad Felix, [place] 07/31/15 1719

## 2015-07-31 NOTE — Discharge Instructions (Signed)
Dental Abscess A dental abscess is pus in or around a tooth. HOME CARE  Take medicines only as told by your dentist.  If you were prescribed antibiotic medicine, finish all of it even if you start to feel better.  Rinse your mouth (gargle) often with salt water.  Do not drive or use heavy machinery, like a lawn mower, while taking pain medicine.  Do not apply heat to the outside of your mouth.  Keep all follow-up visits as told by your dentist. This is important. GET HELP IF:  Your pain is worse, and medicine does not help. GET HELP RIGHT AWAY IF:  You have a fever or chills.  Your symptoms suddenly get worse.  You have a very bad headache.  You have problems breathing or swallowing.  You have trouble opening your mouth.  You have puffiness (swelling) in your neck or around your eye.   This information is not intended to replace advice given to you by your health care provider. Make sure you discuss any questions you have with your health care provider.   Document Released: 08/04/2014 Document Reviewed: 08/04/2014 Elsevier Interactive Patient Education 2016 [employer]  

## 2015-12-31 ENCOUNTER — Ambulatory Visit
Admission: RE | Admit: 2015-12-31 | Discharge: 2015-12-31 | Disposition: A | Discharge status: Home / Self Care | Payer: Medicare Other | Source: Ambulatory Visit | Attending: Internal Medicine | Admitting: Internal Medicine

## 2015-12-31 ENCOUNTER — Ambulatory Visit (HOSPITAL_COMMUNITY)
Admission: RE | Admit: 2015-12-31 | Discharge: 2015-12-31 | Disposition: A | Discharge status: Home / Self Care | Payer: Medicare Other | Source: Ambulatory Visit | Attending: Internal Medicine | Admitting: Internal Medicine

## 2015-12-31 ENCOUNTER — Other Ambulatory Visit: Payer: Self-pay | Admitting: Internal Medicine

## 2015-12-31 ENCOUNTER — Other Ambulatory Visit (HOSPITAL_COMMUNITY): Payer: Self-pay | Admitting: Internal Medicine

## 2015-12-31 DIAGNOSIS — R1084 Generalized abdominal pain: Secondary | ICD-10-CM

## 2015-12-31 DIAGNOSIS — R1031 Right lower quadrant pain: Secondary | ICD-10-CM

## 2015-12-31 MED ORDER — IOPAMIDOL (ISOVUE-300) INJECTION 61%
30.0000 mL | Freq: Once | INTRAVENOUS | Status: AC | PRN
  Administered 2015-12-31: 30 mL via ORAL

## 2015-12-31 MED ORDER — IOPAMIDOL (ISOVUE-300) INJECTION 61%
100.0000 mL | Freq: Once | INTRAVENOUS | Status: AC | PRN
  Administered 2015-12-31: 100 mL via INTRAVENOUS

## 2016-04-02 ENCOUNTER — Encounter (HOSPITAL_COMMUNITY): Payer: Self-pay | Admitting: Emergency Medicine

## 2016-04-02 ENCOUNTER — Ambulatory Visit (HOSPITAL_COMMUNITY)
Admission: EM | Admit: 2016-04-02 | Discharge: 2016-04-02 | Disposition: A | Discharge status: Home / Self Care | Payer: Medicare Other | Attending: Emergency Medicine | Admitting: Emergency Medicine

## 2016-04-02 DIAGNOSIS — J0140 Acute pansinusitis, unspecified: Secondary | ICD-10-CM

## 2016-04-02 MED ORDER — BENZONATATE 100 MG PO CAPS: 100.0000 mg | ORAL_CAPSULE | Freq: Three times a day (TID) | ORAL | 0 refills | Status: DC

## 2016-04-02 MED ORDER — LEVOFLOXACIN 750 MG PO TABS: 750.0000 mg | ORAL_TABLET | Freq: Every day | ORAL | 0 refills | Status: DC

## 2016-04-02 NOTE — Discharge Instructions (Signed)
I suspect you have sinus infection. You also have an early ear infection on the right side. Take Levaquin daily for 5 days. This is an antibiotic. Continue your Nettie pot at home. Use the Tessalon 3 times a day as needed for cough. You should be starting to feel better in 2-3 days. Follow-up as needed.

## 2016-04-02 NOTE — ED Triage Notes (Signed)
Patient has been battling a cold for a month or more.  Patient reports 3 days ago symptoms worsened drastically.  Left ear pain, persistent cough, minimal productive cough.  Phlegm is mostly clear, slightly yellow discharge.  Sinus feel full, ears feel full

## 2016-04-02 NOTE — ED Provider Notes (Signed)
[place]    CSN: [id number] Arrival date & time: 04/02/16  1510     History   Chief Complaint Chief Complaint  Patient presents with  . Cough    HPI Adrian [name] is a 65 y.o. male.   HPI  He is a 65 year old man here for evaluation of cough. He reports having cold symptoms on and off for the last month, but they got acutely worse yesterday. At the beginning of all this, he was treated with a Z-Pak which did help. Current symptoms include nasal congestion, sore throat, and cough. He also reports low energy and fatigue. No shortness of breath or wheezing. He reports subjective fever last night, but he did not check it. He has been taking Mucinex and Delsym without much improvement.  Past Medical History:  Diagnosis Date  . Arthritis   . DJD (degenerative joint disease)   . Hypertension   . Seasonal allergies     There are no active problems to display for this patient.   Past Surgical History:  Procedure Laterality Date  . APPENDECTOMY     hemorrhaged post op  . BACK SURGERY    . [place] SURGERY  1995  . HERNIA REPAIR     umbilical  . KNEE ARTHROSCOPY  2010   lt  . LUMBAR LAMINECTOMY  2008  . SEPTOPLASTY         Home Medications    Prior to Admission medications   Medication Sig Start Date End Date Taking? Authorizing Provider  dextromethorphan (DELSYM) 30 MG/5ML liquid Take by mouth as needed for cough.   Yes Historical Provider, MD  acetaminophen (TYLENOL) 500 MG tablet Take 500 mg by mouth every 6 (six) hours as needed for pain.    Historical Provider, MD  benzonatate (TESSALON) 100 MG capsule Take 1 capsule (100 mg total) by mouth every 8 (eight) hours. 04/02/16   Melony Overly, MD  fexofenadine (ALLEGRA) 180 MG tablet Take 180 mg by mouth daily.    Historical Provider, MD  fluticasone (FLONASE) 50 MCG/ACT nasal spray Place 2 sprays into the nose daily as needed for rhinitis or allergies.    Historical Provider, MD  hydrochlorothiazide  (HYDRODIURIL) 12.5 MG tablet Take 12.5 mg by mouth daily.    Historical Provider, MD  levofloxacin (LEVAQUIN) 750 MG tablet Take 1 tablet (750 mg total) by mouth daily. 04/02/16   Melony Overly, MD  valsartan (DIOVAN) 80 MG tablet Take 80 mg by mouth daily.    Historical Provider, MD    Family History No family history on file.  Social History Social History  Substance Use Topics  . Smoking status: Never Smoker  . Smokeless tobacco: Not on file  . Alcohol use Yes     Allergies   Patient has no known allergies.   Review of Systems Review of Systems As in history of present illness  Physical Exam Triage Vital Signs ED Triage Vitals [04/02/16 1619]  Enc Vitals Group     BP 155/78     Pulse Rate 97     Resp 18     Temp 98.6 F (37 C)     Temp Source Oral     SpO2 98 %     Weight      Height      Head Circumference      Peak Flow      Pain Score 3     Pain Loc      Pain  Edu?      Excl. in [place]?    No data found.   Updated Vital Signs BP 155/78 (BP Location: Left Arm)   Pulse 97   Temp 98.6 F (37 C) (Oral)   Resp 18   SpO2 98%   Visual Acuity Right Eye Distance:   Left Eye Distance:   Bilateral Distance:    Right Eye Near:   Left Eye Near:    Bilateral Near:     Physical Exam  Constitutional: He is oriented to person, place, and time. He appears well-developed and well-nourished. No distress.  HENT:  Mouth/Throat: Oropharynx is clear and moist. No oropharyngeal exudate.  Nasal mucosa is erythematous. He has diffuse sinus tenderness. Left TM is normal. Right TM with milky effusion, but no bulging.  Neck: Neck supple.  Cardiovascular: Normal rate, regular rhythm and normal heart sounds.   No murmur heard. Pulmonary/Chest: Effort normal and breath sounds normal. No respiratory distress. He has no wheezes. He has no rales.  Lymphadenopathy:    He has no cervical adenopathy.  Neurological: He is alert and oriented to person, place, and time.     UC  Treatments / Results  Labs (all labs ordered are listed, but only abnormal results are displayed) Labs Reviewed - No data to display  EKG  EKG Interpretation None       Radiology No results found.  Procedures Procedures (including critical care time)  Medications Ordered in UC Medications - No data to display   Initial Impression / Assessment and Plan / UC Course  I have reviewed the triage vital signs and the nursing notes.  Pertinent labs & imaging results that were available during my care of the patient were reviewed by me and considered in my medical decision making (see chart for details).  Clinical Course     We'll treat with Levaquin to cover sinusitis as well as ear infection and lung. Tessalon as needed for cough. Continue home nettie pot.  Final Clinical Impressions(s) / UC Diagnoses   Final diagnoses:  Acute non-recurrent pansinusitis    New Prescriptions Discharge Medication List as of 04/02/2016  5:16 PM    START taking these medications   Details  benzonatate (TESSALON) 100 MG capsule Take 1 capsule (100 mg total) by mouth every 8 (eight) hours., Starting Sun 04/02/2016, Normal    levofloxacin (LEVAQUIN) 750 MG tablet Take 1 tablet (750 mg total) by mouth daily., Starting Sun 04/02/2016, Normal         Melony Overly, MD 04/02/16 1730

## 2017-11-21 DIAGNOSIS — C4492 Squamous cell carcinoma of skin, unspecified: Secondary | ICD-10-CM

## 2017-11-21 HISTORY — DX: Squamous cell carcinoma of skin, unspecified: C44.92

## 2018-02-05 ENCOUNTER — Other Ambulatory Visit: Payer: Self-pay | Admitting: Internal Medicine

## 2018-02-05 DIAGNOSIS — N50811 Right testicular pain: Secondary | ICD-10-CM

## 2018-02-12 ENCOUNTER — Other Ambulatory Visit: Payer: Self-pay | Admitting: Internal Medicine

## 2018-02-12 DIAGNOSIS — R1011 Right upper quadrant pain: Secondary | ICD-10-CM

## 2018-02-15 ENCOUNTER — Ambulatory Visit
Admission: RE | Admit: 2018-02-15 | Discharge: 2018-02-15 | Disposition: A | Discharge status: Home / Self Care | Payer: Medicare Other | Source: Ambulatory Visit | Attending: Internal Medicine | Admitting: Internal Medicine

## 2018-02-15 ENCOUNTER — Other Ambulatory Visit: Payer: Medicare Other

## 2018-02-15 DIAGNOSIS — R1011 Right upper quadrant pain: Secondary | ICD-10-CM

## 2018-02-15 DIAGNOSIS — N50811 Right testicular pain: Secondary | ICD-10-CM

## 2018-04-30 ENCOUNTER — Other Ambulatory Visit: Payer: Self-pay | Admitting: Internal Medicine

## 2018-04-30 DIAGNOSIS — E785 Hyperlipidemia, unspecified: Secondary | ICD-10-CM

## 2018-05-20 ENCOUNTER — Other Ambulatory Visit: Payer: Self-pay | Admitting: Internal Medicine

## 2018-05-20 DIAGNOSIS — J019 Acute sinusitis, unspecified: Secondary | ICD-10-CM

## 2018-06-04 ENCOUNTER — Ambulatory Visit
Admission: RE | Admit: 2018-06-04 | Discharge: 2018-06-04 | Disposition: A | Discharge status: Home / Self Care | Payer: Medicare Other | Source: Ambulatory Visit | Attending: Internal Medicine | Admitting: Internal Medicine

## 2018-06-04 DIAGNOSIS — E785 Hyperlipidemia, unspecified: Secondary | ICD-10-CM

## 2018-06-04 DIAGNOSIS — J019 Acute sinusitis, unspecified: Secondary | ICD-10-CM

## 2018-06-18 ENCOUNTER — Other Ambulatory Visit: Payer: Medicare Other

## 2018-06-28 ENCOUNTER — Telehealth: Payer: Self-pay

## 2018-06-28 NOTE — Telephone Encounter (Signed)
TELEPHONE CALL NOTE  Adrian [name] has been deemed a candidate for a follow-up tele-health visit to limit community exposure during the Covid-19 pandemic. I spoke with the patient via phone to ensure availability of phone/video source, confirm preferred email & phone number, and discuss instructions and expectations.  I reminded Adrian [name] to be prepared with any vital sign and/or heart rhythm information that could potentially be obtained via home monitoring, at the time of his visit. I reminded Adrian [name] to expect a phone call at the time of his visit if his visit.  Did the patient verbally acknowledge consent to treatment? YES  Meryl Crutch, RN 06/28/2018 4:00 PM   DOWNLOADING THE [place], go to [employer] and type in WebEx in the search bar. [place] [employer], the blue/green circle. The app is free but as with any other app downloads, their phone may require them to verify saved payment information or Apple password. The patient does NOT have to create an account.  - If Android, ask patient to go to [employer] and type in WebEx in the search bar. [place] [employer], the blue/green circle. The app is free but as with any other app downloads, their phone may require them to verify saved payment information or Android password. The patient does NOT have to create an account.   CONSENT FOR TELE-HEALTH VISIT - PLEASE REVIEW  I hereby voluntarily request, consent and authorize [facility] and its employed or contracted physicians, physician assistants, nurse practitioners or other licensed health care professionals (the Practitioner), to provide me with telemedicine health care services (the "Services") as deemed necessary by the treating Practitioner. I acknowledge and consent to receive the Services by the Practitioner via telemedicine. I understand that the telemedicine visit will involve communicating with the Practitioner  through live audiovisual communication technology and the disclosure of certain medical information by electronic transmission. I acknowledge that I have been given the opportunity to request an in-person assessment or other available alternative prior to the telemedicine visit and am voluntarily participating in the telemedicine visit.  I understand that I have the right to withhold or withdraw my consent to the use of telemedicine in the course of my care at any time, without affecting my right to future care or treatment, and that the Practitioner or I may terminate the telemedicine visit at any time. I understand that I have the right to inspect all information obtained and/or recorded in the course of the telemedicine visit and may receive copies of available information for a reasonable fee.  I understand that some of the potential risks of receiving the Services via telemedicine include:  Marland Kitchen Delay or interruption in medical evaluation due to technological equipment failure or disruption; . Information transmitted may not be sufficient (e.g. poor resolution of images) to allow for appropriate medical decision making by the Practitioner; and/or  . In rare instances, security protocols could fail, causing a breach of personal health information.  Furthermore, I acknowledge that it is my responsibility to provide information about my medical history, conditions and care that is complete and accurate to the best of my ability. I acknowledge that Practitioner's advice, recommendations, and/or decision may be based on factors not within their control, such as incomplete or inaccurate data provided by me or distortions of diagnostic images or specimens that may result from electronic transmissions. I understand that the practice of medicine is not an exact  science and that Practitioner makes no warranties or guarantees regarding treatment outcomes. I acknowledge that I will receive a copy of this consent  concurrently upon execution via email to the email address I last provided but may also request a printed copy by calling the office of [place].    I understand that my insurance will be billed for this visit.   I have read or had this consent read to me. . I understand the contents of this consent, which adequately explains the benefits and risks of the Services being provided via telemedicine.  . I have been provided ample opportunity to ask questions regarding this consent and the Services and have had my questions answered to my satisfaction. . I give my informed consent for the services to be provided through the use of telemedicine in my medical care  By participating in this telemedicine visit I agree to the above.

## 2018-07-01 ENCOUNTER — Other Ambulatory Visit: Payer: Self-pay

## 2018-07-02 ENCOUNTER — Telehealth (INDEPENDENT_AMBULATORY_CARE_PROVIDER_SITE_OTHER): Payer: Medicare Other | Admitting: Cardiology

## 2018-07-02 ENCOUNTER — Encounter: Payer: Self-pay | Admitting: Cardiology

## 2018-07-02 VITALS — BP 117/84 | HR 93 | Ht 70.0 in | Wt 184.6 lb

## 2018-07-02 DIAGNOSIS — Z712 Person consulting for explanation of examination or test findings: Secondary | ICD-10-CM

## 2018-07-02 DIAGNOSIS — I2584 Coronary atherosclerosis due to calcified coronary lesion: Secondary | ICD-10-CM | POA: Diagnosis not present

## 2018-07-02 DIAGNOSIS — I10 Essential (primary) hypertension: Secondary | ICD-10-CM | POA: Insufficient documentation

## 2018-07-02 DIAGNOSIS — I2510 Atherosclerotic heart disease of native coronary artery without angina pectoris: Secondary | ICD-10-CM | POA: Insufficient documentation

## 2018-07-02 DIAGNOSIS — E7800 Pure hypercholesterolemia, unspecified: Secondary | ICD-10-CM | POA: Insufficient documentation

## 2018-07-02 NOTE — Progress Notes (Signed)
Virtual Visit via Telephone Note    Evaluation Performed:  Establish care visit  This visit type was conducted due to national recommendations for restrictions regarding the COVID-19 Pandemic (e.g. social distancing).  This format is felt to be most appropriate for this patient at this time.  All issues noted in this document were discussed and addressed.  No physical exam was performed (except for noted visual exam findings with Video Visits).  Please refer to the patient's chart (MyChart message for video visits and phone note for telephone visits) for the patient's consent to telehealth for [facility].  Date:  07/02/2018   ID:  Adrian [name], DOB [birth year], MRN [id number]  Patient Location:  [place],  [address]  Provider location:   [place], [place] Office  PCP:  Marton Redwood, MD  Cardiologist:  Buford Dresser (new)  Chief Complaint:  Discuss coronary calcium score and CV risk  History of Present Illness:    Adrian [name] is a 68 y.o. male who presents via audio/video conferencing for a telehealth visit today.    The patient does not have symptoms concerning for COVID-19 infection (fever, chills, cough, or new shortness of breath).   Per Dr. Raul Del note, he has a history of hypertension, hyperlipidemia, and impaired fasting glucose. His most recent lipid panel showed Tchol 190, LDL 124, HDL 55, TG 54. He was not on a statin at that time. Plan was for coronary calcium score for further risk assessment. This was done and showed a score of 2190 Agatson units (95th percentile), with calcifications in LAD, RCA, and LCx.  Patient comments today: Has been on statin since 3/9. No issues with side effects. Has recheck already scheduled in early June with Dr. Raul Del office. Dr. Brigitte Pulse had discussed statin with him for several years, but he had several family members/friends who had issues with atorvastatin and he wished to avoid. After  getting his calcium score, he started rosuvastatin.  Cardiovascular risk factors: Prior clinical ASCVD: none before calcium score Comorbid conditions: hypertension, hyperlipidemia, impaired fasting glucose  Metabolic syndrome/Obesity: not since losing weight Chronic inflammatory conditions: none Tobacco use history: former, quit in college Family history: one brother with stroke, one brother with heart issues Prior cardiac testing: coronary calcium score  Exercise level: works out regularly. Had a personal trainer last year, now continuing on his own. Does silver sneakers, exercises when he travels with his wife. Was doing at least 3x/week, up to 6-7 days/week when he was home.  Current diet: has lost weight intentionally. Peak weight 205, now 184. No alcohol, cut back portions, cut out a lot of dairy and red meat. Eats a lot of salad and vegetables.  Has degenerative disc disease, has intermittent numbness chronically. Yesterday had dull central back pain between shoulder blades, got better with icing his neck. No typical anginal symptoms.   Denies chest pain, shortness of breath at rest or with normal exertion. No PND, orthopnea, LE edema or unexpected weight gain. No syncope or palpitations.  Prior CV studies:   The following studies were reviewed today:  Notes from Dr Brigitte Pulse  Past Medical History:  Diagnosis Date   Arthritis    DJD (degenerative joint disease)    Hypertension    Seasonal allergies    Past Surgical History:  Procedure Laterality Date   APPENDECTOMY     hemorrhaged post op   [place]  HERNIA REPAIR     umbilical   KNEE ARTHROSCOPY  2010   lt   LUMBAR LAMINECTOMY  2008   SEPTOPLASTY       Current Meds  Medication Sig   aspirin EC 81 MG tablet Take 81 mg by mouth daily.   fluticasone (FLONASE) 50 MCG/ACT nasal spray Place 2 sprays into the nose daily as needed for rhinitis or allergies.    hydrochlorothiazide (HYDRODIURIL) 12.5 MG tablet Take 12.5 mg by mouth daily.   montelukast (SINGULAIR) 10 MG tablet Take 10 mg by mouth as needed.   olmesartan (BENICAR) 20 MG tablet Take 20 mg by mouth daily.   Rosuvastatin Calcium 20 MG CPSP Take 20 mg by mouth daily.   [DISCONTINUED] naproxen (NAPROSYN) 500 MG tablet Take 500 mg by mouth as needed (Pain).     Allergies:   Patient has no known allergies.   Social History   Tobacco Use   Smoking status: Former Smoker    Types: Cigarettes    Start date: 04/03/1972    Last attempt to quit: 04/03/1978    Years since quitting: 40.2   Smokeless tobacco: Never Used  Substance Use Topics   Alcohol use: Yes   Drug use: No     Family Hx: Brother with CVA, another brother with heart disease  ROS:   Please see the history of present illness.    All other systems reviewed and are negative.   Labs/Other Tests and Data Reviewed:    Recent Labs: No results found for requested labs within last 8760 hours.   Recent Lipid Panel No results found for: CHOL, TRIG, HDL, CHOLHDL, LDLCALC, LDLDIRECT  Wt Readings from Last 3 Encounters:  07/02/18 184 lb 9.6 oz (83.7 kg)  05/17/11 200 lb (90.7 kg)     Objective:    Vital Signs:  BP 117/84    Pulse 93    Ht 5\' 10"  (1.778 m)    Wt 184 lb 9.6 oz (83.7 kg)    BMI 26.49 kg/m     ASSESSMENT & PLAN:    1.  Coronary artery disease: diagnosed based on coronary calcium score. No symptoms, even with heavy exertion -on aspirin 81 mg, just started rosuvastatin 20 mg. LDL goal <70. Check lipids and LFTs in 3 mos (he states this has been arranged by Dr. Raul Del office). -risk factor control: excellent lifestyle (no alcohol, no tobacco, heavy exercise, very good diet). BP controlled on hydrochlorothiazide and olmesartan -no diabetes diagnosis, has been borderline with A1c >5.5, but excellent diet/exercise/weight loss -counseled on red flag warning symptoms that need immediate medical  attention -reviewed recommendations re: NSAIDs and CAD. He takes naproxen only rarely, will stop completely  Patient Risk:   After full review of this patient's clinical status, I feel that they are at least moderate risk at this time.  Time:   Today, I have spent 32 minutes with the patient with telehealth technology discussing CAD pathology, diagnosis, and management.     Medication Adjustments/Labs and Tests Ordered: Current medicines are reviewed at length with the patient today.  Concerns regarding medicines are outlined above.   Patient Instructions  Medication Instructions:  Your Physician recommend you make the following changes to your medication. Stop: Naproxen   If you need a refill on your cardiac medications before your next appointment, please call your pharmacy.   Lab work: None   Testing/Procedures: None  Follow-Up: At [facility], you and your health needs are our priority.  As part  of our continuing mission to provide you with exceptional heart care, we have created designated Provider Care Teams.  These Care Teams include your primary Cardiologist (physician) and Advanced Practice Providers (APPs -  Physician Assistants and Nurse Practitioners) who all work together to provide you with the care you need, when you need it. You will need a follow up appointment in 3 months.  Please call our office 2 months in advance to schedule this appointment.  You may see Dr. Harrell Gave or one of the following Advanced Practice Providers on your designated Care Team:   Rosaria Ferries, PA-C  Jory Sims, DNP, ANP       Disposition:  Follow up in 3 month(s)  Signed, Buford Dresser, MD  07/02/2018 10:06 AM    [place]

## 2018-07-02 NOTE — Patient Instructions (Signed)
Medication Instructions:  Your Physician recommend you make the following changes to your medication. Stop: Naproxen   If you need a refill on your cardiac medications before your next appointment, please call your pharmacy.   Lab work: None   Testing/Procedures: None  Follow-Up: At [facility], you and your health needs are our priority.  As part of our continuing mission to provide you with exceptional heart care, we have created designated Provider Care Teams.  These Care Teams include your primary Cardiologist (physician) and Advanced Practice Providers (APPs -  Physician Assistants and Nurse Practitioners) who all work together to provide you with the care you need, when you need it. You will need a follow up appointment in 3 months.  Please call our office 2 months in advance to schedule this appointment.  You may see Dr. Harrell Gave or one of the following Advanced Practice Providers on your designated Care Team:   Rosaria Ferries, PA-C . Jory Sims, DNP, ANP

## 2018-07-23 ENCOUNTER — Telehealth: Payer: Self-pay | Admitting: Cardiology

## 2018-07-23 ENCOUNTER — Emergency Department (HOSPITAL_COMMUNITY)
Admission: EM | Admit: 2018-07-23 | Discharge: 2018-07-23 | Disposition: A | Discharge status: Home / Self Care | Payer: Medicare Other | Attending: Emergency Medicine | Admitting: Emergency Medicine

## 2018-07-23 ENCOUNTER — Emergency Department (HOSPITAL_COMMUNITY): Payer: Medicare Other

## 2018-07-23 ENCOUNTER — Other Ambulatory Visit: Payer: Self-pay

## 2018-07-23 DIAGNOSIS — Z87891 Personal history of nicotine dependence: Secondary | ICD-10-CM | POA: Diagnosis not present

## 2018-07-23 DIAGNOSIS — R079 Chest pain, unspecified: Secondary | ICD-10-CM | POA: Diagnosis not present

## 2018-07-23 DIAGNOSIS — I10 Essential (primary) hypertension: Secondary | ICD-10-CM | POA: Diagnosis not present

## 2018-07-23 LAB — CBC
HCT: 44.6 % (ref 39.0–52.0)
Hemoglobin: 14.6 g/dL (ref 13.0–17.0)
MCH: 27.9 pg (ref 26.0–34.0)
MCHC: 32.7 g/dL (ref 30.0–36.0)
MCV: 85.1 fL (ref 80.0–100.0)
Platelets: 260 10*3/uL (ref 150–400)
RBC: 5.24 MIL/uL (ref 4.22–5.81)
RDW: 13.2 % (ref 11.5–15.5)
WBC: 7.7 10*3/uL (ref 4.0–10.5)
nRBC: 0 % (ref 0.0–0.2)

## 2018-07-23 LAB — BASIC METABOLIC PANEL
Anion gap: 12 (ref 5–15)
BUN: 10 mg/dL (ref 8–23)
CO2: 22 mmol/L (ref 22–32)
Calcium: 9.5 mg/dL (ref 8.9–10.3)
Chloride: 104 mmol/L (ref 98–111)
Creatinine, Ser: 0.82 mg/dL (ref 0.61–1.24)
GFR calc Af Amer: >60 mL/min (ref >60)
GFR calc non Af Amer: >60 mL/min (ref >60)
Glucose, Bld: 113 mg/dL — ABNORMAL HIGH (ref 70–99)
Potassium: 3.5 mmol/L (ref 3.5–5.1)
Sodium: 138 mmol/L (ref 135–145)

## 2018-07-23 LAB — TROPONIN I
Troponin I: <0.03 ng/mL (ref <0.03)
Troponin I: <0.03 ng/mL (ref <0.03)

## 2018-07-23 MED ORDER — KETOROLAC TROMETHAMINE 15 MG/ML IJ SOLN
15.0000 mg | Freq: Once | INTRAMUSCULAR | Status: AC
  Administered 2018-07-23: 15 mg via INTRAMUSCULAR
  Filled 2018-07-23: qty 1

## 2018-07-23 MED ORDER — SODIUM CHLORIDE 0.9% FLUSH: 3.0000 mL | Freq: Once | INTRAVENOUS | Status: DC

## 2018-07-23 NOTE — Telephone Encounter (Signed)
Trish from [facility] notified pt is in route to ER.

## 2018-07-23 NOTE — Telephone Encounter (Signed)
New Message:     Pt just called and wants to notify you that he is on his way to [facility] ER. His wife will be driving him to the ER. He said EMT left his home a few minutes ago, he had chest pain. He wants to go and be checked , so he can rule out that this is not Cardiac. He wants you to notify them, that he s [place] his way [place].

## 2018-07-23 NOTE — ED Triage Notes (Signed)
Pt reports left sided CP since 1100, 4-81 mg of ASA an hour ago.

## 2018-07-23 NOTE — ED Notes (Signed)
Patient verbalizes understanding of discharge instructions. Opportunity for questioning and answers were provided. Armband removed by staff, pt discharged from ED.  

## 2018-07-23 NOTE — Discharge Instructions (Addendum)
You were evaluated in the Emergency Department and after careful evaluation, we did not find any emergent condition requiring admission or further testing in the hospital.  Your testing today was very reassuring with no evidence of heart damage.  As discussed, please mention your symptoms to your cardiologist and ask them about the need for outpatient stress testing.  Please return to the Emergency Department if you experience any worsening of your condition.  We encourage you to follow up with a primary care provider.  Thank you for allowing [place] to be a part of your care.

## 2018-07-23 NOTE — ED Provider Notes (Signed)
[facility] Emergency Department Provider Note MRN:  [id number]  Arrival date & time: 07/23/18     Chief Complaint   Chest Pain   History of Present Illness   Adrian [name] is a 68 y.o. year-old male with a history of calcified coronary arteries presenting to the ED with chief complaint of chest pain.  Patient is endorsing chest pain that began at 11 AM this morning.  He relates the pain to his chronic neck pain related to degenerative disc disease.  The pain is also present in the left shoulder and radiates down the left arm.  Again this pain is similar to prior pain related to his neck.  The chest pain is located in the left side of the chest, is constant, described as sharp, improved with some changes of neck position.  Associated with some diaphoresis today.  Explains that he has had panic attacks that felt similarly.  Denies fever, no cough, no nausea, no vomiting, no shortness of breath, no abdominal pain, no numbness or weakness to the arms or legs.  Review of Systems  A complete 10 system review of systems was obtained and all systems are negative except as noted in the HPI and PMH.   Patient's Health History    Past Medical History:  Diagnosis Date  . Arthritis   . DJD (degenerative joint disease)   . Hypertension   . Seasonal allergies     Past Surgical History:  Procedure Laterality Date  . APPENDECTOMY     hemorrhaged post op  . BACK SURGERY    . [place] SURGERY  1995  . HERNIA REPAIR     umbilical  . KNEE ARTHROSCOPY  2010   lt  . LUMBAR LAMINECTOMY  2008  . SEPTOPLASTY      No family history on file.  Social History   Socioeconomic History  . Marital status: Married    Spouse name: Not on file  . Number of children: Not on file  . Years of education: Not on file  . Highest education level: Not on file  Occupational History  . Not on file  Social Needs  . Financial resource strain: Not on file  . Food insecurity:    Worry: Not  on file    Inability: Not on file  . Transportation needs:    Medical: Not on file    Non-medical: Not on file  Tobacco Use  . Smoking status: Former Smoker    Types: Cigarettes    Start date: 04/03/1972    Last attempt to quit: 04/03/1978    Years since quitting: 40.3  . Smokeless tobacco: Never Used  Substance and Sexual Activity  . Alcohol use: Yes  . Drug use: No  . Sexual activity: Not on file  Lifestyle  . Physical activity:    Days per week: Not on file    Minutes per session: Not on file  . Stress: Not on file  Relationships  . Social connections:    Talks on phone: Not on file    Gets together: Not on file    Attends religious service: Not on file    Active member of club or organization: Not on file    Attends meetings of clubs or organizations: Not on file    Relationship status: Not on file  . Intimate partner violence:    Fear of current or ex partner: Not on file    Emotionally abused: Not on file  Physically abused: Not on file    Forced sexual activity: Not on file  Other Topics Concern  . Not on file  Social History Narrative  . Not on file     Physical Exam  Vital Signs and Nursing Notes reviewed Vitals:   07/23/18 1830 07/23/18 1845  BP: 126/73   Pulse: 79 81  Resp: 11 10  Temp:    SpO2: 96% 98%    CONSTITUTIONAL: Well-appearing, NAD NEURO:  Alert and oriented x 3, no focal deficits EYES:  eyes equal and reactive ENT/NECK:  no LAD, no JVD CARDIO: Regular rate, well-perfused, normal S1 and S2 PULM:  CTAB no wheezing or rhonchi GI/GU:  normal bowel sounds, non-distended, non-tender MSK/SPINE:  No gross deformities, no edema SKIN:  no rash, atraumatic PSYCH:  Appropriate speech and behavior  Diagnostic and Interventional Summary    EKG Interpretation  Date/Time:  Tuesday July 23 2018 13:31:48 EDT Ventricular Rate:  102 PR Interval:  150 QRS Duration: 92 QT Interval:  340 QTC Calculation: 443 R Axis:   81 Text Interpretation:   Sinus tachycardia Right atrial enlargement Incomplete right bundle branch block Borderline ECG Confirmed by Gerlene Fee [phone]) on 07/23/2018 3:08:22 PM      Labs Reviewed  BASIC METABOLIC PANEL - Abnormal; Notable for the following components:      Result Value   Glucose, Bld 113 (*)    All other components within normal limits  CBC  TROPONIN I  TROPONIN I    DG Chest 2 View  Final Result      Medications  ketorolac (TORADOL) 15 MG/ML injection 15 mg (15 mg Intramuscular Given 07/23/18 1625)     Procedures Critical Care  ED Course and Medical Decision Making  I have reviewed the triage vital signs and the nursing notes.  Pertinent labs & imaging results that were available during my care of the patient were reviewed by me and considered in my medical decision making (see below for details).  Atypical chest pain in this 68 year old male with CT evidence of calcified coronary arteries.  Seems unlikely to be cardiac in etiology.  EKG is without acute ischemic concerns, first troponin negative.  Shared decision-making was utilized with the patient, who given age and risk factors would qualify for inpatient chest pain rule out admission or observation.  Would also be reasonable to obtain second troponin and have very close follow-up with his cardiologist.  Patient chooses the latter option, second troponin pending.  Patient is without evidence of DVT, no hypoxia, little to no concern for PE.  Second troponin is negative, patient will follow-up with cardiologist.  After the discussed management above, the patient was determined to be safe for discharge.  The patient was in agreement with this plan and all questions regarding their care were answered.  ED return precautions were discussed and the patient will return to the ED with any significant worsening of condition.  Barth Kirks. [place] Small, [place] [email or web address]  Final  Clinical Impressions(s) / ED Diagnoses     ICD-10-CM   1. Chest pain, unspecified type R07.9     ED Discharge Orders    None         Maudie Flakes, MD 07/23/18 2220

## 2018-09-20 ENCOUNTER — Telehealth: Payer: Self-pay

## 2018-09-20 NOTE — Telephone Encounter (Signed)
    COVID-19 Pre-Screening Questions:  . In the past 7 to 10 days have you had a cough,  shortness of breath, headache, congestion, fever (100 or greater) body aches, chills, sore throat, or sudden loss of taste or sense of smell? No . Have you been around anyone with known Covid 19. No . Have you been around anyone who is awaiting Covid 19 test results in the past 7 to 10 days? No . Have you been around anyone who has been exposed to Covid 19, or has mentioned symptoms of Covid 19 within the past 7 to 10 days? No  If you have any concerns/questions about symptoms patients report during screening (either on the phone or at threshold). Contact the provider seeing the patient or DOD for further guidance.  If neither are available contact a member of the leadership team.  Pt made aware to wear a mask and no visitor. Pt voiced understanding.

## 2018-09-24 ENCOUNTER — Ambulatory Visit (INDEPENDENT_AMBULATORY_CARE_PROVIDER_SITE_OTHER): Payer: Medicare Other | Admitting: Cardiology

## 2018-09-24 ENCOUNTER — Other Ambulatory Visit: Payer: Self-pay

## 2018-09-24 ENCOUNTER — Encounter: Payer: Self-pay | Admitting: Cardiology

## 2018-09-24 VITALS — BP 125/77 | HR 86 | Temp 97.9°F | Ht 70.0 in | Wt 186.4 lb

## 2018-09-24 DIAGNOSIS — I2584 Coronary atherosclerosis due to calcified coronary lesion: Secondary | ICD-10-CM

## 2018-09-24 DIAGNOSIS — Z7189 Other specified counseling: Secondary | ICD-10-CM

## 2018-09-24 DIAGNOSIS — I10 Essential (primary) hypertension: Secondary | ICD-10-CM | POA: Diagnosis not present

## 2018-09-24 DIAGNOSIS — I2510 Atherosclerotic heart disease of native coronary artery without angina pectoris: Secondary | ICD-10-CM | POA: Diagnosis not present

## 2018-09-24 DIAGNOSIS — E7800 Pure hypercholesterolemia, unspecified: Secondary | ICD-10-CM

## 2018-09-24 NOTE — Patient Instructions (Signed)
Medication Instructions:  Stop: Hydrochlorothiazide 12.5 mg  If you need a refill on your cardiac medications before your next appointment, please call your pharmacy.   Lab work: None   Testing/Procedures: None  Follow-Up: At [facility], you and your health needs are our priority.  As part of our continuing mission to provide you with exceptional heart care, we have created designated Provider Care Teams.  These Care Teams include your primary Cardiologist (physician) and Advanced Practice Providers (APPs -  Physician Assistants and Nurse Practitioners) who all work together to provide you with the care you need, when you need it. You will need a follow up appointment in 3 months.  Please call our office 2 months in advance to schedule this appointment.  You may see Dr. Harrell Gave or one of the following Advanced Practice Providers on your designated Care Team:   Rosaria Ferries, PA-C . Jory Sims, DNP, ANP

## 2018-09-24 NOTE — Progress Notes (Signed)
Cardiology Office Note:    Date:  09/24/2018   ID:  Adrian [name], DOB June 26, 1950, MRN [id number]  PCP:  Marton Redwood, MD  Cardiologist:  Adrian Dresser, MD PhD  Referring MD: Marton Redwood, MD   CC: Follow up  History of Present Illness:    Adrian [name] is a 68 y.o. male with a hx of HTN, HLD, impaired fasting glucose who is seen in follow up today. His initial virtual consult with me was on 07/02/18.  Cardiac history:  His most recent lipid panel showed Tchol 190, LDL 124, HDL 55, TG 54. He was not on a statin at that time. Plan was for coronary calcium score for further risk assessment. This was done and showed a score of 2190 Agatson units (95th percentile), with calcifications in LAD, RCA, and LCx. Started on statin 06/10/18.  Today: Since our initial visit, he was seen in the ER 07/23/18 for chest pain. No change to ECG, negative troponin, felt to be MSK. Recommended for outpatient follow up. Reports pain down left and right arm, broke out in a sweat. Pain in the central sternum as well. Was handing a wheelbarrow at the time, which certain movements are a trigger for him. Triggers with one sudden movement, and he also feels in his spine between his shoulders blade. Sudden, sharp pain like being stabbed with a screwdriver, sometimes dull aching pain that lasts for days. Not with exercise.   Concerns today: -Fingernails: ok today, but has had some dark spots grow out. None today. Recommended f/u with PCP if they do not grow out (2/2 concern for melanoma) -Was very edgy the first month on statins, but improved. Added CoQ10, tolerating statin -BP has been well controlled at home, as low as 110/60, highest in the ER due to pain. At home doesn't even see 062 systolic, most in the 694W-546E/70J-50K. Stopped taking HCTZ for the last 2 days to see if he can come off of it. Usually high in the doctor's office but well controlled today  Walking at least 3 miles/day at 2.7 mi/hr. Has issues  with elliptical due to MSK pain with moving arms. No chest pain with walking. Takes aspirin daily except when he takes ibuprofen for neck pain in the AM (rare).   Wt 181 at home, peak was 210 a few years ago. Cut out red meat, cheese, eating mostly vegetables.  Labs from Dr. Raul Del office 09/2018: Tchol 134, TG 69, HDL 48, LDL 72 LFTs, BMP normal except for mildly elevated glucose at 118  Past Medical History:  Diagnosis Date  . Arthritis   . DJD (degenerative joint disease)   . Hypertension   . Seasonal allergies     Past Surgical History:  Procedure Laterality Date  . APPENDECTOMY     hemorrhaged post op  . BACK SURGERY    . [place] SURGERY  1995  . HERNIA REPAIR     umbilical  . KNEE ARTHROSCOPY  2010   lt  . LUMBAR LAMINECTOMY  2008  . SEPTOPLASTY      Current Medications: Current Outpatient Medications on File Prior to Visit  Medication Sig  . aspirin EC 81 MG tablet Take 81 mg by mouth daily.  Marland Kitchen olmesartan (BENICAR) 20 MG tablet Take 20 mg by mouth daily.  . Rosuvastatin Calcium 20 MG CPSP Take 20 mg by mouth daily.   No current facility-administered medications on file prior to visit.      Allergies:   Patient has  no known allergies.   Social History   Socioeconomic History  . Marital status: Married    Spouse name: Not on file  . Number of children: Not on file  . Years of education: Not on file  . Highest education level: Not on file  Occupational History  . Not on file  Social Needs  . Financial resource strain: Not on file  . Food insecurity    Worry: Not on file    Inability: Not on file  . Transportation needs    Medical: Not on file    Non-medical: Not on file  Tobacco Use  . Smoking status: Former Smoker    Types: Cigarettes    Start date: 04/03/1972    Quit date: 04/03/1978    Years since quitting: 40.5  . Smokeless tobacco: Never Used  Substance and Sexual Activity  . Alcohol use: Yes  . Drug use: No  . Sexual activity: Not on  file  Lifestyle  . Physical activity    Days per week: Not on file    Minutes per session: Not on file  . Stress: Not on file  Relationships  . Social Herbalist on phone: Not on file    Gets together: Not on file    Attends religious service: Not on file    Active member of club or organization: Not on file    Attends meetings of clubs or organizations: Not on file    Relationship status: Not on file  Other Topics Concern  . Not on file  Social History Narrative  . Not on file     Family History: One brother with stroke, one brother with heart issues.  ROS:   Please see the history of present illness.  Additional pertinent ROS:  Constitutional: Negative for chills, fever, night sweats, unintentional weight loss  HENT: Negative for ear pain and hearing loss.   Eyes: Negative for loss of vision and eye pain.  Respiratory: Negative for cough, sputum, shortness of breath, wheezing.   Cardiovascular: See HPI. Gastrointestinal: Negative for abdominal pain, melena, and hematochezia.  Genitourinary: Negative for dysuria and hematuria.  Musculoskeletal: Negative for falls and myalgias.  Skin: Negative for itching and rash.  Neurological: Negative for focal weakness, focal sensory changes and loss of consciousness.  Endo/Heme/Allergies: Does not bruise/bleed easily.    EKGs/Labs/Other Studies Reviewed:    The following studies were reviewed today: Prior calcium score  EKG:  No ECG available  Recent Labs: 07/23/2018: BUN 10; Creatinine, Ser 0.82; Hemoglobin 14.6; Platelets 260; Potassium 3.5; Sodium 138  Recent Lipid Panel No results found for: CHOL, TRIG, HDL, CHOLHDL, VLDL, LDLCALC, LDLDIRECT  Physical Exam:    VS:  BP 125/77   Pulse 86   Temp 97.9 F (36.6 C)   Ht 5\' 10"  (1.778 m)   Wt 186 lb 6.4 oz (84.6 kg)   SpO2 97%   BMI 26.75 kg/m     Wt Readings from Last 3 Encounters:  09/24/18 186 lb 6.4 oz (84.6 kg)  07/02/18 184 lb 9.6 oz (83.7 kg)   05/17/11 200 lb (90.7 kg)     GEN: Well nourished, well developed in no acute distress HEENT: Normal NECK: No JVD; No carotid bruits LYMPHATICS: No lymphadenopathy CARDIAC: regular rhythm, normal S1 and S2, no murmurs, rubs, gallops. Radial and DP pulses 2+ bilaterally. RESPIRATORY:  Clear to auscultation without rales, wheezing or rhonchi  ABDOMEN: Soft, non-tender, non-distended MUSCULOSKELETAL:  No edema; No deformity  SKIN:  Warm and dry NEUROLOGIC:  Alert and oriented x 3 PSYCHIATRIC:  Normal affect   ASSESSMENT:    1. Coronary artery disease due to calcified coronary lesion   2. Pure hypercholesterolemia   3. Cardiac risk counseling   4. Counseling on health promotion and disease prevention    PLAN:    Coronary artery disease: diagnosed based on coronary calcium score. No symptoms, even with heavy exertion -on aspirin 81 mg, tolerating rosuvastatin 20 mg. LDL goal <70. Recently LDL 72, LFTs normal per Dr. Raul Del notes. -risk factor control: excellent lifestyle (no alcohol, no tobacco, heavy exercise, very good diet). BP controlled -no diabetes diagnosis, has been borderline with A1c >5.5 -counseled on red flag warning symptoms that need immediate medical attention -have previously reviewed risk of NSAIDs and CAD. He takes ibuprofen only when he absolutely cannot tolerate the MSK pain. -His symptoms have atypical components; he has known MSK/DDD; he does not have an exertional component typically to his discomfort. However, with his calcium score, he is high risk. We discussed treadmill stress test. He would like to get his MSK and vertigo under better control first.   Hypertension: has been well controlled. Wants to cut back on meds if he can. Already began trialing off HCTZ, BP has been well controlled.  -ok to stop HCTZ and monitor BP -continue olmesartan  Cardiac risk counseling and prevention recommendations: -recommend heart healthy/Mediterranean diet, with whole  grains, fruits, vegetable, fish, lean meats, nuts, and olive oil. Limit salt. -recommend moderate walking, 3-5 times/week for 30-50 minutes each session. Aim for at least 150 minutes.week. Goal should be pace of 3 miles/hours, or walking 1.5 miles in 30 minutes  Plan for follow up: 3 mos  Medication Adjustments/Labs and Tests Ordered: Current medicines are reviewed at length with the patient today.  Concerns regarding medicines are outlined above.    Patient Instructions  Medication Instructions:  Stop: Hydrochlorothiazide 12.5 mg  If you need a refill on your cardiac medications before your next appointment, please call your pharmacy.   Lab work: None   Testing/Procedures: None  Follow-Up: At [facility], you and your health needs are our priority.  As part of our continuing mission to provide you with exceptional heart care, we have created designated Provider Care Teams.  These Care Teams include your primary Cardiologist (physician) and Advanced Practice Providers (APPs -  Physician Assistants and Nurse Practitioners) who all work together to provide you with the care you need, when you need it. You will need a follow up appointment in 3 months.  Please call our office 2 months in advance to schedule this appointment.  You may see Dr. Harrell Gave or one of the following Advanced Practice Providers on your designated Care Team:   Rosaria Ferries, PA-C . Jory Sims, DNP, ANP        Signed, Adrian Dresser, MD PhD 09/24/2018 3:23 PM    [place]

## 2018-12-20 ENCOUNTER — Ambulatory Visit: Payer: Medicare Other | Admitting: Cardiology

## 2018-12-24 ENCOUNTER — Ambulatory Visit (INDEPENDENT_AMBULATORY_CARE_PROVIDER_SITE_OTHER): Payer: Medicare Other | Admitting: Cardiology

## 2018-12-24 ENCOUNTER — Other Ambulatory Visit: Payer: Self-pay

## 2018-12-24 ENCOUNTER — Encounter: Payer: Self-pay | Admitting: Cardiology

## 2018-12-24 VITALS — BP 139/76 | HR 79 | Temp 97.2°F | Ht 70.0 in | Wt 187.2 lb

## 2018-12-24 DIAGNOSIS — E7800 Pure hypercholesterolemia, unspecified: Secondary | ICD-10-CM | POA: Diagnosis not present

## 2018-12-24 DIAGNOSIS — I2584 Coronary atherosclerosis due to calcified coronary lesion: Secondary | ICD-10-CM

## 2018-12-24 DIAGNOSIS — R079 Chest pain, unspecified: Secondary | ICD-10-CM | POA: Diagnosis not present

## 2018-12-24 DIAGNOSIS — I10 Essential (primary) hypertension: Secondary | ICD-10-CM

## 2018-12-24 DIAGNOSIS — I2510 Atherosclerotic heart disease of native coronary artery without angina pectoris: Secondary | ICD-10-CM | POA: Diagnosis not present

## 2018-12-24 DIAGNOSIS — Z7189 Other specified counseling: Secondary | ICD-10-CM

## 2018-12-24 NOTE — Patient Instructions (Addendum)
Medication Instructions:  Your Physician recommend you continue on your current medication as directed.    If you need a refill on your cardiac medications before your next appointment, please call your pharmacy.   Lab work: None  Testing/Procedures: Your physician has requested that you have an exercise tolerance test. For further information please visit [email or web address]. Please also follow instruction sheet, as given. [place]. [address] [place], [place]  Follow-Up: Your physician recommends that you schedule a follow-up appointment in 1 year with Dr. Harrell Gave.       [facility] at [facility] [address] [place], [place] [address] Phone:  [phone]        You are scheduled for an Exercise Stress Test  Please arrive 15 minutes prior to your appointment time for registration and insurance purposes.  The test will take approximately 45 minutes to complete.  How to prepare for your Exercise Stress Test: . Do bring a list of your current medications with you.  If not listed below, you may take your medications as normal. . Do wear comfortable clothes (no dresses or overalls) and walking shoes, tennis shoes preferred (no heels or open toed shoes are allowed) . Do Not wear cologne, perfume, aftershave or lotions (deodorant is allowed). . Please report to [place], [address] for your test.  If these instructions are not followed, your test will have to be rescheduled.  If you have questions or concerns about your appointment, you can call the Stress Lab at [phone].  If you cannot keep your appointment, please provide 24 hours notification to the Stress Lab, to avoid a possible $50 charge to your account

## 2018-12-24 NOTE — Progress Notes (Signed)
Cardiology Office Note:    Date:  12/24/2018   ID:  Adrian [name], DOB 1950-12-12, MRN [id number]  PCP:  Marton Redwood, MD  Cardiologist:  Buford Dresser, MD PhD  Referring MD: Marton Redwood, MD   CC: Follow up  History of Present Illness:    Adrian [name] is a 68 y.o. male with a hx of HTN, HLD, impaired fasting glucose who is seen in follow up today. His initial virtual consult with me was on 07/02/18.  Cardiac history:  His most recent lipid panel showed Tchol 190, LDL 124, HDL 55, TG 54. He was not on a statin at that time. Plan was for coronary calcium score for further risk assessment. This was done and showed a score of 2190 Agatson units (95th percentile), with calcifications in LAD, RCA, and LCx. Started on statin 06/10/18.  Labs from Dr. Raul Del office 09/2018: Tchol 134, TG 69, HDL 48, LDL 72 LFTs, BMP normal except for mildly elevated glucose at 118  Today: Continues to have pain in shoulders/arms and between shoulder blades. We discussed stress test at last visit, but he wanted to see if MSK treatment improved symptoms. As symptoms have not improved, he is amenable to discussing testing today.   Able to do ellipitical, baseline HR improving. Has neck collar, traction unit, etc to help with MSK pain, which is helpful. Actually feels better when he exercises. Able to pushups, etc without issues.  Leg cramps improving. Worked in the yard all day yesterday, used weed eater for 3 hours without issues  Meds confirmed, also taking CoQ10. BP 120s/70s at home, has white coat syndrome.  Denies shortness of breath at rest or with normal exertion. No PND, orthopnea, LE edema or unexpected weight gain. No syncope or palpitations.  Past Medical History:  Diagnosis Date  . Arthritis   . DJD (degenerative joint disease)   . Hypertension   . Seasonal allergies     Past Surgical History:  Procedure Laterality Date  . APPENDECTOMY     hemorrhaged post op  . BACK SURGERY    .  [place] SURGERY  1995  . HERNIA REPAIR     umbilical  . KNEE ARTHROSCOPY  2010   lt  . LUMBAR LAMINECTOMY  2008  . SEPTOPLASTY      Current Medications: Current Outpatient Medications on File Prior to Visit  Medication Sig  . aspirin EC 81 MG tablet Take 81 mg by mouth daily.  Marland Kitchen olmesartan (BENICAR) 20 MG tablet Take 20 mg by mouth daily.  . Rosuvastatin Calcium 20 MG CPSP Take 20 mg by mouth daily.   No current facility-administered medications on file prior to visit.      Allergies:   Patient has no known allergies.   Social History   Socioeconomic History  . Marital status: Married    Spouse name: Not on file  . Number of children: Not on file  . Years of education: Not on file  . Highest education level: Not on file  Occupational History  . Not on file  Social Needs  . Financial resource strain: Not on file  . Food insecurity    Worry: Not on file    Inability: Not on file  . Transportation needs    Medical: Not on file    Non-medical: Not on file  Tobacco Use  . Smoking status: Former Smoker    Types: Cigarettes    Start date: 04/03/1972    Quit date: 04/03/1978  Years since quitting: 40.7  . Smokeless tobacco: Never Used  Substance and Sexual Activity  . Alcohol use: Yes  . Drug use: No  . Sexual activity: Not on file  Lifestyle  . Physical activity    Days per week: Not on file    Minutes per session: Not on file  . Stress: Not on file  Relationships  . Social Herbalist on phone: Not on file    Gets together: Not on file    Attends religious service: Not on file    Active member of club or organization: Not on file    Attends meetings of clubs or organizations: Not on file    Relationship status: Not on file  Other Topics Concern  . Not on file  Social History Narrative  . Not on file     Family History: One brother with stroke, one brother with heart issues.  ROS:   Please see the history of present illness.  Additional  pertinent ROS:  Constitutional: Negative for chills, fever, night sweats, unintentional weight loss  HENT: Negative for ear pain and hearing loss.   Eyes: Negative for loss of vision and eye pain.  Respiratory: Negative for cough, sputum, shortness of breath, wheezing.   Cardiovascular: See HPI. Gastrointestinal: Negative for abdominal pain, melena, and hematochezia.  Genitourinary: Negative for dysuria and hematuria.  Musculoskeletal: Negative for falls and myalgias.  Skin: Negative for itching and rash.  Neurological: Negative for focal weakness, focal sensory changes and loss of consciousness.  Endo/Heme/Allergies: Does not bruise/bleed easily.    EKGs/Labs/Other Studies Reviewed:    The following studies were reviewed today: Prior calcium score  EKG:  Sinus tach, iRBBB from 07/24/18, personally reviewed  Recent Labs: 07/23/2018: BUN 10; Creatinine, Ser 0.82; Hemoglobin 14.6; Platelets 260; Potassium 3.5; Sodium 138  Recent Lipid Panel No results found for: CHOL, TRIG, HDL, CHOLHDL, VLDL, LDLCALC, LDLDIRECT  Physical Exam:    VS:  BP 139/76   Pulse 79   Temp (!) 97.2 F (36.2 C)   Ht 5\' 10"  (1.778 m)   Wt 187 lb 3.2 oz (84.9 kg)   SpO2 97%   BMI 26.86 kg/m     Wt Readings from Last 3 Encounters:  12/24/18 187 lb 3.2 oz (84.9 kg)  09/24/18 186 lb 6.4 oz (84.6 kg)  07/02/18 184 lb 9.6 oz (83.7 kg)     GEN: Well nourished, well developed in no acute distress HEENT: Normal NECK: No JVD; No carotid bruits LYMPHATICS: No lymphadenopathy CARDIAC: regular rhythm, normal S1 and S2, no murmurs, rubs, gallops. Radial and DP pulses 2+ bilaterally. RESPIRATORY:  Clear to auscultation without rales, wheezing or rhonchi  ABDOMEN: Soft, non-tender, non-distended MUSCULOSKELETAL:  No edema; No deformity  SKIN: Warm and dry NEUROLOGIC:  Alert and oriented x 3 PSYCHIATRIC:  Normal affect   ASSESSMENT:    1. Chest pain, unspecified type   2. Coronary artery disease due to  calcified coronary lesion   3. Essential hypertension   4. Pure hypercholesterolemia   5. Cardiac risk counseling   6. Counseling on health promotion and disease prevention    PLAN:    Chest pain: both typical and atypical symptoms. Has significant risk factors.  -discussed treadmill stress, nuclear stress/lexiscan, and CT coronary angiography. Discussed pros and cons of each, including but not limited to false positive/false negative risk, radiation risk, and risk of IV contrast dye. Based on shared decision making, decision was made to ETT. -given known  calcium/CAD, would cath if treadmill abnormal  Coronary artery disease: diagnosed based on coronary calcium score. -on aspirin 81 mg, tolerating rosuvastatin 20 mg. LDL goal <70.  -risk factor control: excellent lifestyle (no alcohol, no tobacco, heavy exercise, very good diet). BP controlled -no diabetes diagnosis, has been borderline with A1c >5.5 -counseled on red flag warning symptoms that need immediate medical attention -have previously reviewed risk of NSAIDs and CAD. He takes ibuprofen only when he absolutely cannot tolerate the MSK pain.  Hypertension: reports good control at home, always elevated at the office. Stopped HCTZ previously. -continue olmesartan  Cardiac risk counseling and prevention recommendations: -recommend heart healthy/Mediterranean diet, with whole grains, fruits, vegetable, fish, lean meats, nuts, and olive oil. Limit salt. -recommend moderate walking, 3-5 times/week for 30-50 minutes each session. Aim for at least 150 minutes.week. Goal should be pace of 3 miles/hours, or walking 1.5 miles in 30 minutes  Plan for follow up: it ETT normal, one year. Otherwise will see back to discuss cath  Medication Adjustments/Labs and Tests Ordered: Current medicines are reviewed at length with the patient today.  Concerns regarding medicines are outlined above.    Patient Instructions  Medication Instructions:   Your Physician recommend you continue on your current medication as directed.    If you need a refill on your cardiac medications before your next appointment, please call your pharmacy.   Lab work: None  Testing/Procedures: Your physician has requested that you have an exercise tolerance test. For further information please visit [email or web address]. Please also follow instruction sheet, as given. [place]. [address] [place], [place]  Follow-Up: Your physician recommends that you schedule a follow-up appointment in 1 year with Dr. Harrell Gave.       [facility] at [facility] [address], [place], [place] [address] Phone:  [phone]        You are scheduled for an Exercise Stress Test  Please arrive 15 minutes prior to your appointment time for registration and insurance purposes.  The test will take approximately 45 minutes to complete.  How to prepare for your Exercise Stress Test: . Do bring a list of your current medications with you.  If not listed below, you may take your medications as normal. . Do wear comfortable clothes (no dresses or overalls) and walking shoes, tennis shoes preferred (no heels or open toed shoes are allowed) . Do Not wear cologne, perfume, aftershave or lotions (deodorant is allowed). . Please report to [place], [address] for your test.  If these instructions are not followed, your test will have to be rescheduled.  If you have questions or concerns about your appointment, you can call the Stress Lab at [phone].  If you cannot keep your appointment, please provide 24 hours notification to the Stress Lab, to avoid a possible $50 charge to your account       Signed, Buford Dresser, MD PhD 12/24/2018  [place]

## 2018-12-30 ENCOUNTER — Encounter: Payer: Self-pay | Admitting: Cardiology

## 2019-01-16 ENCOUNTER — Telehealth (HOSPITAL_COMMUNITY): Payer: Self-pay

## 2019-01-16 NOTE — Telephone Encounter (Signed)
Encounter complete. 

## 2019-01-17 ENCOUNTER — Other Ambulatory Visit (HOSPITAL_COMMUNITY)
Admission: RE | Admit: 2019-01-17 | Discharge: 2019-01-17 | Disposition: A | Discharge status: Home / Self Care | Payer: Medicare Other | Source: Ambulatory Visit | Attending: Cardiology | Admitting: Cardiology

## 2019-01-17 DIAGNOSIS — Z01812 Encounter for preprocedural laboratory examination: Secondary | ICD-10-CM | POA: Diagnosis present

## 2019-01-17 DIAGNOSIS — Z20828 Contact with and (suspected) exposure to other viral communicable diseases: Secondary | ICD-10-CM | POA: Diagnosis not present

## 2019-01-18 LAB — NOVEL CORONAVIRUS, NAA (HOSP ORDER, SEND-OUT TO REF LAB; TAT 18-24 HRS): SARS-CoV-2, NAA: NOT DETECTED

## 2019-01-21 ENCOUNTER — Ambulatory Visit (HOSPITAL_COMMUNITY)
Admission: RE | Admit: 2019-01-21 | Discharge: 2019-01-21 | Disposition: A | Discharge status: Home / Self Care | Payer: Medicare Other | Source: Ambulatory Visit | Attending: Cardiology | Admitting: Cardiology

## 2019-01-21 ENCOUNTER — Other Ambulatory Visit: Payer: Self-pay

## 2019-01-21 DIAGNOSIS — R079 Chest pain, unspecified: Secondary | ICD-10-CM | POA: Diagnosis not present

## 2019-01-21 LAB — EXERCISE TOLERANCE TEST
Estimated workload: 10.4 METS
Exercise duration (min): 9 min
Exercise duration (sec): 0 s
MPHR: 152 {beats}/min
Peak HR: 142 {beats}/min
Percent HR: 93 %
Rest HR: 86 {beats}/min

## 2019-04-04 DIAGNOSIS — N433 Hydrocele, unspecified: Secondary | ICD-10-CM

## 2019-04-04 HISTORY — DX: Hydrocele, unspecified: N43.3

## 2019-05-04 ENCOUNTER — Emergency Department (HOSPITAL_COMMUNITY)
Admission: EM | Admit: 2019-05-04 | Discharge: 2019-05-04 | Disposition: A | Discharge status: Home / Self Care | Payer: Medicare PPO | Attending: Emergency Medicine | Admitting: Emergency Medicine

## 2019-05-04 ENCOUNTER — Encounter (HOSPITAL_COMMUNITY): Payer: Self-pay | Admitting: Emergency Medicine

## 2019-05-04 ENCOUNTER — Other Ambulatory Visit: Payer: Self-pay

## 2019-05-04 ENCOUNTER — Emergency Department (HOSPITAL_COMMUNITY): Payer: Medicare PPO

## 2019-05-04 DIAGNOSIS — Z7982 Long term (current) use of aspirin: Secondary | ICD-10-CM | POA: Diagnosis not present

## 2019-05-04 DIAGNOSIS — N492 Inflammatory disorders of scrotum: Secondary | ICD-10-CM | POA: Diagnosis not present

## 2019-05-04 DIAGNOSIS — I10 Essential (primary) hypertension: Secondary | ICD-10-CM | POA: Insufficient documentation

## 2019-05-04 DIAGNOSIS — Z79899 Other long term (current) drug therapy: Secondary | ICD-10-CM | POA: Insufficient documentation

## 2019-05-04 DIAGNOSIS — N433 Hydrocele, unspecified: Secondary | ICD-10-CM | POA: Insufficient documentation

## 2019-05-04 DIAGNOSIS — N50811 Right testicular pain: Secondary | ICD-10-CM | POA: Diagnosis present

## 2019-05-04 DIAGNOSIS — Z87891 Personal history of nicotine dependence: Secondary | ICD-10-CM | POA: Diagnosis not present

## 2019-05-04 HISTORY — DX: Hydrocele, unspecified: N43.3

## 2019-05-04 NOTE — Discharge Instructions (Signed)
Return for worsening pain, fever, redness

## 2019-05-04 NOTE — ED Provider Notes (Signed)
[facility] EMERGENCY DEPARTMENT Provider Note   CSN: [id number] Arrival date & time: 05/04/19  2003     History Chief Complaint  Patient presents with  . Right Testicular pain with swelling    Adrian [name] is a 69 y.o. male.  69 yo M with a chief complaints of right testicular pain and swelling.  This been an ongoing issue for him going on for about the past year.  He states that yesterday he did some work in the yard and then afterwards had worsening pain and swelling.  He has a urologist appointment this upcoming week but he got canceled due to an issue with his urologist.  He denies any issues urinating denies fevers or chills.  Has been taking Tylenol and ibuprofen with improvement.  Worse with ambulation or palpation.  Denies overt trauma.  The history is provided by the patient.  Illness Severity:  Moderate Onset quality:  Gradual Duration:  1 day Timing:  Constant Progression:  Worsening Chronicity:  New Associated symptoms: no abdominal pain, no chest pain, no congestion, no diarrhea, no fever, no headaches, no myalgias, no rash, no shortness of breath and no vomiting        Past Medical History:  Diagnosis Date  . Arthritis   . DJD (degenerative joint disease)   . Hydrocele   . Hypertension   . Seasonal allergies     Patient Active Problem List   Diagnosis Date Noted  . Coronary artery disease due to calcified coronary lesion 07/02/2018  . Essential hypertension 07/02/2018  . Pure hypercholesterolemia 07/02/2018    Past Surgical History:  Procedure Laterality Date  . APPENDECTOMY     hemorrhaged post op  . BACK SURGERY    . [place] SURGERY  1995  . HERNIA REPAIR     umbilical  . KNEE ARTHROSCOPY  2010   lt  . LUMBAR LAMINECTOMY  2008  . SEPTOPLASTY         No family history on file.  Social History   Tobacco Use  . Smoking status: Former Smoker    Types: Cigarettes    Start date: 04/03/1972    Quit date: 04/03/1978      Years since quitting: 41.1  . Smokeless tobacco: Never Used  Substance Use Topics  . Alcohol use: Yes  . Drug use: No    Home Medications Prior to Admission medications   Medication Sig Start Date End Date Taking? Authorizing Provider  aspirin EC 81 MG tablet Take 81 mg by mouth daily.   Yes [provider]  ibuprofen (ADVIL) 200 MG tablet Take 200 mg by mouth every 6 (six) hours as needed.   Yes [provider]  olmesartan (BENICAR) 20 MG tablet Take 20 mg by mouth daily.   Yes [provider]  Rosuvastatin Calcium 20 MG CPSP Take 20 mg by mouth daily.   Yes [provider]    Allergies    Patient has no known allergies.  Review of Systems   Review of Systems  Constitutional: Negative for chills and fever.  HENT: Negative for congestion and facial swelling.   Eyes: Negative for discharge and visual disturbance.  Respiratory: Negative for shortness of breath.   Cardiovascular: Negative for chest pain and palpitations.  Gastrointestinal: Negative for abdominal pain, diarrhea and vomiting.  Genitourinary: Positive for testicular pain.  Musculoskeletal: Negative for arthralgias and myalgias.  Skin: Negative for color change and rash.  Neurological: Negative for tremors, syncope and  headaches.  Psychiatric/Behavioral: Negative for confusion and dysphoric mood.    Physical Exam Updated Vital Signs BP 140/83   Pulse 94   Temp 98.2 F (36.8 C) (Oral)   Resp 20   Ht 5\' 10"  (1.778 m)   Wt 83 kg   SpO2 100%   BMI 26.26 kg/m   Physical Exam Vitals and nursing note reviewed.  Constitutional:      Appearance: He is well-developed.  HENT:     Head: Normocephalic and atraumatic.  Eyes:     Pupils: Pupils are equal, round, and reactive to light.  Neck:     Vascular: No JVD.  Cardiovascular:     Rate and Rhythm: Normal rate and regular rhythm.     Heart sounds: No murmur. No friction rub. No gallop.   Pulmonary:     Effort: No  respiratory distress.     Breath sounds: No wheezing.  Abdominal:     General: There is no distension.     Tenderness: There is no abdominal tenderness. There is no guarding or rebound.  Genitourinary:    Comments: Significant right scrotal swelling.  I am unable to palpate a testicle discrete from likely fluid collection.  Left testicle is somewhat larger than I would anticipate as well.  No appreciable hernia.  Circumcised Musculoskeletal:        General: Normal range of motion.     Cervical back: Normal range of motion and neck supple.  Skin:    Coloration: Skin is not pale.     Findings: No rash.  Neurological:     Mental Status: He is alert and oriented to person, place, and time.  Psychiatric:        Behavior: Behavior normal.     ED Results / Procedures / Treatments   Labs (all labs ordered are listed, but only abnormal results are displayed) Labs Reviewed - No data to display  EKG None  Radiology US SCROTUM W/DOPPLER  Result Date: 05/04/2019 CLINICAL DATA:  Right scrotal pain and swelling for several days EXAM: SCROTAL ULTRASOUND DOPPLER ULTRASOUND OF THE TESTICLES TECHNIQUE: Complete ultrasound examination of the testicles, epididymis, and other scrotal structures was performed. Color and spectral Doppler ultrasound were also utilized to evaluate blood flow to the testicles. COMPARISON:  02/15/2018 FINDINGS: Right testicle Measurements: 5.2 x 2.8 x 2.7 cm. No mass or microlithiasis visualized. Left testicle Measurements: 4.2 x 2.2 x 3.3 cm. No mass or microlithiasis visualized. Right epididymis:  Not well visualized Left epididymis:  Normal in size and appearance. Hydrocele: Large bilateral hydroceles are identified increased when compared with the prior exam. Some septations are noted on the left likely related to the overall chronicity. Varicocele:  None visualized. Pulsed Doppler interrogation of both testes demonstrates normal low resistance arterial and venous waveforms  bilaterally. Small scrotal pearls are noted on the left. IMPRESSION: Large bilateral hydroceles which have enlarged in size. There are some septations noted on the left consistent a complex nature. These are increased in the interval from the prior exam. Normal-appearing testicles. Electronically Signed   By: Inez Catalina M.D.   On: 05/04/2019 22:22    Procedures Procedures (including critical care time)  Medications Ordered in ED Medications - No data to display  ED Course  I have reviewed the triage vital signs and the nursing notes.  Pertinent labs & imaging results that were available during my care of the patient were reviewed by me and considered in my medical decision making (see chart for  details).    MDM Rules/Calculators/A&P                      69 yo M with a chief complaints of right testicular pain.  Ongoing issue for him but worsening over the past 24 hours.  Will obtain a testicular ultrasound.  [place] without torsion.  Consistent with prior with hydrocele though larger.  D/c home.   10:40 PM:  I have discussed the diagnosis/risks/treatment options with the patient and believe the pt to be eligible for discharge home to follow-up with PCP. We also discussed returning to the ED immediately if new or worsening sx occur. We discussed the sx which are most concerning (e.g., sudden worsening pain, fever, inability to tolerate by mouth) that necessitate immediate return. Medications administered to the patient during their visit and any new prescriptions provided to the patient are listed below.  Medications given during this visit Medications - No data to display   The patient appears reasonably screen and/or stabilized for discharge and I doubt any other medical condition or other [facility] requiring further screening, evaluation, or treatment in the ED at this time prior to discharge.   Final Clinical Impression(s) / ED Diagnoses Final diagnoses:  Hydrocele in adult    Rx / DC  Orders ED Discharge Orders    None       Deno Etienne, DO 05/04/19 2240

## 2019-05-04 NOTE — ED Triage Notes (Signed)
Patient reports right testicular pain with swelling onset Thursday this week , denies injury , no hematuria or dysuria , history of hydrocele , deneis fever or chills .

## 2019-05-04 NOTE — ED Notes (Signed)
Pt to US.

## 2019-05-13 ENCOUNTER — Other Ambulatory Visit: Payer: Self-pay | Admitting: Urology

## 2019-07-03 ENCOUNTER — Other Ambulatory Visit: Payer: Self-pay

## 2019-07-03 ENCOUNTER — Encounter (HOSPITAL_BASED_OUTPATIENT_CLINIC_OR_DEPARTMENT_OTHER): Payer: Self-pay | Admitting: Urology

## 2019-07-03 NOTE — Progress Notes (Signed)
Spoke with patient via telephone for pre op interview. NPO after MN. No medications AM of surgery. Current EKG in epic. Arrival time 27.

## 2019-07-10 ENCOUNTER — Other Ambulatory Visit (HOSPITAL_COMMUNITY)
Admission: RE | Admit: 2019-07-10 | Discharge: 2019-07-10 | Disposition: A | Discharge status: Home / Self Care | Payer: Medicare PPO | Source: Ambulatory Visit | Attending: Urology | Admitting: Urology

## 2019-07-10 DIAGNOSIS — Z01812 Encounter for preprocedural laboratory examination: Secondary | ICD-10-CM | POA: Insufficient documentation

## 2019-07-10 DIAGNOSIS — Z20822 Contact with and (suspected) exposure to covid-19: Secondary | ICD-10-CM | POA: Insufficient documentation

## 2019-07-10 LAB — SARS CORONAVIRUS 2 (TAT 6-24 HRS): SARS Coronavirus 2: NEGATIVE

## 2019-07-11 NOTE — H&P (Signed)
HPI: Adrian [name] is a 69 year-old male with a right hydrocele.  He first noticed his hydrocele 16 months ago. His hydrocele is on both sides. The patient has undergone a scrotal ultrasound. He does have pain on the side of his hydrocele. His hydrocele does not cause restriction of normal activities.   He has not had injuries to the testicles or scrotum. He has had the following surgeries: Vasectomy. The patient denies having the following scrotal surgeries: Hernia Repair, Hydrocele Repair, Undescended Testis Surgery, and Varicocele Repair. He has not had a testicular infection. His hydrocele does not bother him enough to consider surgical repair. This condition would be considered of mild to moderate severity with no modifying factors or associated signs or symptoms other than as noted above.   03/14/18: On 02/04/18 and he reported about a 6 week history of right testicular pain and swelling. He said the swelling seemed to be worse throughout the day and describes the pain as a dull ache with some associated pain around the rectum. A scrotal ultrasound was performed on 02/15/18 which revealed normal testicles bilaterally, a 5 x 6 mm right epididymal head cyst and a 2 mm left epididymal cyst as well as bilateral hydroceles with the right being much larger than the left. He reports that he had an acute increase in swelling of his right hemiscrotum about 2 months ago that was associated with some pain. He used ibuprofen and ice with resolution of his pain. He said occasionally has some slight discomfort in the right testicle but also has chronic lumbar back pain and also right lower quadrant discomfort from a previously complicated appendectomy. He also indicates that he has had a history of prostatitis but is able to manage this with ice as well.   05/07/19: The patient was seen in the emergency room on 05/04/19 with right testicular pain and swelling. He has a history of a right hydrocele but the day before he had  been working in the yard and after were noted increased pain and swelling. A scrotal ultrasound was obtained which revealed normal testicles bilaterally he with bilateral hydroceles that had increased in size since his last ultrasound in 11/09.  He reports that strenuous activity results in increased size of his right hydrocele and causes some discomfort. He finds that ice causes a decrease in the size of the hydrocele but it always persists.      ALLERGIES: None   MEDICATIONS: Crestor 10 mg tablet  Hydrochlorothiazide 12.5 mg capsule  Acetaminophen 500 mg capsule  Aspirin Ec 81 mg tablet, delayed release  Ibuprofen 200 mg capsule  Nasal Spray  Olmesartan     GU PSH: Vasectomy     NON-GU PSH: Appendectomy Back Surgery (Unspecified)     GU PMH: Hydrocele, Unspec, Bilateral, He has a symptomatic bilateral hydroceles the right being greater than the left. He does not want to consider surgery so I have recommended he return to see me should he develop any further difficulties. - 03/14/2018    NON-GU PMH: Arthritis GERD Hypertension Hyperthyroidism    FAMILY HISTORY: 2 sons - Son Hypertension - Father Prostate Cancer - Brother   SOCIAL HISTORY: Marital Status: Married Preferred Language: English; Ethnicity: Not Hispanic Or Latino; Race: White Current Smoking Status: Patient does not smoke anymore.   Tobacco Use Assessment Completed: Used Tobacco in last 30 days? Social Drinker.  Drinks 4+ caffeinated drinks per day.    REVIEW OF SYSTEMS:    GU Review Male:  Patient denies frequent urination, hard to postpone urination, burning/ pain with urination, get up at night to urinate, leakage of urine, stream starts and stops, trouble starting your stream, have to strain to urinate , erection problems, and penile pain.  Gastrointestinal (Upper):   Patient denies nausea, vomiting, and indigestion/ heartburn.  Gastrointestinal (Lower):   Patient denies diarrhea and constipation.   Constitutional:   Patient denies fever, night sweats, weight loss, and fatigue.  Skin:   Patient denies skin rash/ lesion and itching.  Eyes:   Patient denies double vision and blurred vision.  Ears/ Nose/ Throat:   Patient denies sore throat and sinus problems.  Hematologic/Lymphatic:   Patient denies swollen glands and easy bruising.  Cardiovascular:   Patient denies leg swelling and chest pains.  Respiratory:   Patient denies cough and shortness of breath.  Endocrine:   Patient denies excessive thirst.  Musculoskeletal:   Patient denies back pain and joint pain.  Neurological:   Patient denies headaches and dizziness.  Psychologic:   Patient denies depression and anxiety.   VITAL SIGNS:    Weight 185 lb / 83.91 kg  Height 70 in / 177.8 cm  BP 149/81 mmHg  Heart Rate 80 /min  Temperature 98.4 F / 36.8 C  BMI 26.5 kg/m   GU PHYSICAL EXAMINATION:    Scrotum: No lesions. No edema. No cysts. No warts.  Epididymides: Right: no spermatocele, no masses, no cysts, no tenderness, no induration, no enlargement. Left: no spermatocele, no masses, no cysts, no tenderness, no induration, no enlargement.  Testes: He has a moderately large hydrocele on the right-hand side and a smaller hydrocele on the left. No inguinal hernias are noted.   Urethral Meatus: Normal size. No lesion, no wart, no discharge, no polyp. Normal location.  Penis: Circumcised, no warts, no cracks. No dorsal Peyronie's plaques, no left corporal Peyronie's plaques, no right corporal Peyronie's plaques, no scarring, no warts. No balanitis, no meatal stenosis.   MULTI-SYSTEM PHYSICAL EXAMINATION:    Constitutional: Well-nourished. No physical deformities. Normally developed. Good grooming.  Neck: Neck symmetrical, not swollen. Normal tracheal position.  Respiratory: No labored breathing, no use of accessory muscles.   Cardiovascular: Normal temperature, normal extremity pulses, no swelling, no varicosities.  Lymphatic: No  enlargement of neck, axillae, groin.  Skin: No paleness, no jaundice, no cyanosis. No lesion, no ulcer, no rash.  Neurologic / Psychiatric: Oriented to time, oriented to place, oriented to person. No depression, no anxiety, no agitation.  Gastrointestinal: No mass, no tenderness, no rigidity, non obese abdomen.  Eyes: Normal conjunctivae. Normal eyelids.  Ears, Nose, Mouth, and Throat: Left ear no scars, no lesions, no masses. Right ear no scars, no lesions, no masses. Nose no scars, no lesions, no masses. Normal hearing. Normal lips.  Musculoskeletal: Normal gait and station of head and neck.     PAST DATA REVIEWED:  Source Of History:  Patient  Lab Test Review:   BUN/Creatinine  Records Review:   Previous Hospital Records, Previous Patient Records, POC Tool  X-Ray Review: Scrotal Ultrasound: Reviewed Films. Reviewed Report. Discussed With Patient.     03/12/18  PSA  Total PSA 2.59 ng/dl   Notes:                     Review of his POC tool reveals a creatinine of 0.8 and a PSA of 1.72 in 9/20.   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          Notes:   Although he has bilateral hydroceles the left one is small and is not bothering him. I told him that he aspiration would result in a high probability of recurrence. We therefore discussed hydrocelectomy in detail. I went over the incision used, the risks and complications, the probability of success, the outpatient nature of procedure as well as the anticipated postoperative course   He would like to have the right side treated and understands that when this is treated since he does have a small hydrocele on the left-hand side it will become more prominent. He said the left side is not symptomatic and therefore he does not believe he would want to have that treated at the time I do his right  hydrocelectomy. He is quite concerned about recurrence and therefore I did discuss leaving a Penrose drain postoperatively to help reduce that risk. He has been having some back issues and wants to get that looked into 1st and will contact me if and when he wants to proceed with surgery.

## 2019-07-14 ENCOUNTER — Ambulatory Visit (HOSPITAL_BASED_OUTPATIENT_CLINIC_OR_DEPARTMENT_OTHER): Payer: Medicare PPO | Admitting: Anesthesiology

## 2019-07-14 ENCOUNTER — Other Ambulatory Visit: Payer: Self-pay

## 2019-07-14 ENCOUNTER — Ambulatory Visit (HOSPITAL_BASED_OUTPATIENT_CLINIC_OR_DEPARTMENT_OTHER)
Admission: RE | Admit: 2019-07-14 | Discharge: 2019-07-14 | Disposition: A | Discharge status: Home / Self Care | Payer: Medicare PPO | Attending: Urology | Admitting: Urology

## 2019-07-14 ENCOUNTER — Encounter (HOSPITAL_BASED_OUTPATIENT_CLINIC_OR_DEPARTMENT_OTHER)
Admission: RE | Disposition: A | Discharge status: Home / Self Care | Payer: Self-pay | Source: Home / Self Care | Attending: Urology

## 2019-07-14 ENCOUNTER — Encounter (HOSPITAL_BASED_OUTPATIENT_CLINIC_OR_DEPARTMENT_OTHER): Payer: Self-pay | Admitting: Urology

## 2019-07-14 DIAGNOSIS — Z7982 Long term (current) use of aspirin: Secondary | ICD-10-CM | POA: Insufficient documentation

## 2019-07-14 DIAGNOSIS — N430 Encysted hydrocele: Secondary | ICD-10-CM

## 2019-07-14 DIAGNOSIS — Z79899 Other long term (current) drug therapy: Secondary | ICD-10-CM | POA: Insufficient documentation

## 2019-07-14 DIAGNOSIS — I10 Essential (primary) hypertension: Secondary | ICD-10-CM | POA: Diagnosis not present

## 2019-07-14 DIAGNOSIS — Z8249 Family history of ischemic heart disease and other diseases of the circulatory system: Secondary | ICD-10-CM | POA: Insufficient documentation

## 2019-07-14 DIAGNOSIS — N433 Hydrocele, unspecified: Secondary | ICD-10-CM | POA: Diagnosis not present

## 2019-07-14 DIAGNOSIS — Z87891 Personal history of nicotine dependence: Secondary | ICD-10-CM | POA: Diagnosis not present

## 2019-07-14 DIAGNOSIS — Z9852 Vasectomy status: Secondary | ICD-10-CM | POA: Insufficient documentation

## 2019-07-14 DIAGNOSIS — M1990 Unspecified osteoarthritis, unspecified site: Secondary | ICD-10-CM | POA: Insufficient documentation

## 2019-07-14 DIAGNOSIS — E7800 Pure hypercholesterolemia, unspecified: Secondary | ICD-10-CM | POA: Diagnosis not present

## 2019-07-14 HISTORY — PX: HYDROCELE EXCISION: SHX482

## 2019-07-14 SURGERY — HYDROCELECTOMY
Anesthesia: General | Laterality: Right

## 2019-07-14 MED ORDER — PROPOFOL 10 MG/ML IV BOLUS
INTRAVENOUS | Status: DC | PRN
  Administered 2019-07-14: 170 mg via INTRAVENOUS
  Administered 2019-07-14: 30 mg via INTRAVENOUS

## 2019-07-14 MED ORDER — DEXAMETHASONE SODIUM PHOSPHATE 10 MG/ML IJ SOLN
INTRAMUSCULAR | Status: DC | PRN
  Administered 2019-07-14: 10 mg via INTRAVENOUS

## 2019-07-14 MED ORDER — LIDOCAINE 2% (20 MG/ML) 5 ML SYRINGE
INTRAMUSCULAR | Status: DC | PRN
  Administered 2019-07-14: 100 mg via INTRAVENOUS

## 2019-07-14 MED ORDER — CEFAZOLIN SODIUM-DEXTROSE 2-4 GM/100ML-% IV SOLN
2.0000 g | Freq: Once | INTRAVENOUS | Status: AC
  Administered 2019-07-14: 2 g via INTRAVENOUS
  Filled 2019-07-14: qty 100

## 2019-07-14 MED ORDER — LIDOCAINE 2% (20 MG/ML) 5 ML SYRINGE
INTRAMUSCULAR | Status: AC
  Filled 2019-07-14: qty 5

## 2019-07-14 MED ORDER — CELECOXIB 200 MG PO CAPS
200.0000 mg | ORAL_CAPSULE | Freq: Once | ORAL | Status: DC
  Filled 2019-07-14: qty 1

## 2019-07-14 MED ORDER — PROPOFOL 10 MG/ML IV BOLUS
INTRAVENOUS | Status: AC
  Filled 2019-07-14: qty 40

## 2019-07-14 MED ORDER — ACETAMINOPHEN 500 MG PO TABS
1000.0000 mg | ORAL_TABLET | Freq: Once | ORAL | Status: DC
  Filled 2019-07-14: qty 2

## 2019-07-14 MED ORDER — ONDANSETRON HCL 4 MG/2ML IJ SOLN
INTRAMUSCULAR | Status: AC
  Filled 2019-07-14: qty 2

## 2019-07-14 MED ORDER — FENTANYL CITRATE (PF) 100 MCG/2ML IJ SOLN
INTRAMUSCULAR | Status: AC
  Filled 2019-07-14: qty 2

## 2019-07-14 MED ORDER — BUPIVACAINE-EPINEPHRINE 0.5% -1:200000 IJ SOLN
INTRAMUSCULAR | Status: DC | PRN
  Administered 2019-07-14: 17 mL

## 2019-07-14 MED ORDER — FENTANYL CITRATE (PF) 100 MCG/2ML IJ SOLN
25.0000 ug | INTRAMUSCULAR | Status: DC | PRN
  Administered 2019-07-14 (×3): 25 ug via INTRAVENOUS
  Filled 2019-07-14: qty 1

## 2019-07-14 MED ORDER — FENTANYL CITRATE (PF) 100 MCG/2ML IJ SOLN
INTRAMUSCULAR | Status: DC | PRN
  Administered 2019-07-14: 50 ug via INTRAVENOUS

## 2019-07-14 MED ORDER — ONDANSETRON HCL 4 MG/2ML IJ SOLN
4.0000 mg | Freq: Once | INTRAMUSCULAR | Status: DC | PRN
  Filled 2019-07-14: qty 2

## 2019-07-14 MED ORDER — CEFAZOLIN SODIUM-DEXTROSE 2-4 GM/100ML-% IV SOLN
INTRAVENOUS | Status: AC
  Filled 2019-07-14: qty 100

## 2019-07-14 MED ORDER — TRIPLE ANTIBIOTIC 3.5-400-5000 EX OINT
TOPICAL_OINTMENT | CUTANEOUS | Status: DC | PRN
  Administered 2019-07-14: 1

## 2019-07-14 MED ORDER — HYDROCODONE-ACETAMINOPHEN 10-325 MG PO TABS: 1.0000 | ORAL_TABLET | ORAL | 0 refills | Status: DC | PRN

## 2019-07-14 MED ORDER — LACTATED RINGERS IV SOLN
INTRAVENOUS | Status: DC
  Filled 2019-07-14: qty 1000

## 2019-07-14 MED ORDER — ONDANSETRON HCL 4 MG/2ML IJ SOLN
INTRAMUSCULAR | Status: DC | PRN
  Administered 2019-07-14: 4 mg via INTRAVENOUS

## 2019-07-14 MED ORDER — DEXAMETHASONE SODIUM PHOSPHATE 10 MG/ML IJ SOLN
INTRAMUSCULAR | Status: AC
  Filled 2019-07-14: qty 1

## 2019-07-14 SURGICAL SUPPLY — 47 items
BLADE CLIPPER SENSICLIP SURGIC (BLADE) ×1 IMPLANT
BLADE SURG 15 STRL LF DISP TIS (BLADE) ×1 IMPLANT
BLADE SURG 15 STRL SS (BLADE) ×2
BNDG GAUZE ELAST 4 BULKY (GAUZE/BANDAGES/DRESSINGS) ×2 IMPLANT
CANISTER SUCT 1200ML W/VALVE (MISCELLANEOUS) IMPLANT
CANISTER SUCT 3000ML PPV (MISCELLANEOUS) IMPLANT
CLEANER CAUTERY TIP 5X5 PAD (MISCELLANEOUS) IMPLANT
COVER BACK TABLE 60X90IN (DRAPES) ×2 IMPLANT
COVER MAYO STAND STRL (DRAPES) ×2 IMPLANT
COVER WAND RF STERILE (DRAPES) ×2 IMPLANT
DISSECTOR ROUND CHERRY 3/8 STR (MISCELLANEOUS) IMPLANT
DRAIN PENROSE 0.25X18 (DRAIN) ×1 IMPLANT
DRAPE LAPAROTOMY 100X72 PEDS (DRAPES) ×2 IMPLANT
ELECT REM PT RETURN 9FT ADLT (ELECTROSURGICAL)
ELECTRODE REM PT RTRN 9FT ADLT (ELECTROSURGICAL) IMPLANT
GAUZE SPONGE 4X4 12PLY STRL (GAUZE/BANDAGES/DRESSINGS) ×1 IMPLANT
GLOVE BIO SURGEON STRL SZ7 (GLOVE) ×2 IMPLANT
GLOVE BIO SURGEON STRL SZ8 (GLOVE) ×2 IMPLANT
GLOVE BIOGEL PI IND STRL 7.0 (GLOVE) IMPLANT
GLOVE BIOGEL PI IND STRL 7.5 (GLOVE) IMPLANT
GLOVE BIOGEL PI INDICATOR 7.0 (GLOVE) ×2
GLOVE BIOGEL PI INDICATOR 7.5 (GLOVE) ×1
GOWN STRL REUS W/ TWL LRG LVL3 (GOWN DISPOSABLE) ×1 IMPLANT
GOWN STRL REUS W/ TWL XL LVL3 (GOWN DISPOSABLE) ×1 IMPLANT
GOWN STRL REUS W/TWL LRG LVL3 (GOWN DISPOSABLE) ×4 IMPLANT
GOWN STRL REUS W/TWL XL LVL3 (GOWN DISPOSABLE) ×2
KIT TURNOVER CYSTO (KITS) ×2 IMPLANT
NDL HYPO 25X1 1.5 SAFETY (NEEDLE) ×1 IMPLANT
NEEDLE HYPO 25X1 1.5 SAFETY (NEEDLE) ×2 IMPLANT
NS IRRIG 500ML POUR BTL (IV SOLUTION) ×2 IMPLANT
PACK BASIN DAY SURGERY FS (CUSTOM PROCEDURE TRAY) ×2 IMPLANT
PAD CLEANER CAUTERY TIP 5X5 (MISCELLANEOUS)
PENCIL BUTTON HOLSTER BLD 10FT (ELECTRODE) ×2 IMPLANT
SUPPORT SCROTAL LG STRP (MISCELLANEOUS) ×2 IMPLANT
SUT CHROMIC 3 0 SH 27 (SUTURE) ×4 IMPLANT
SUT ETHILON 3 0 PS 1 (SUTURE) ×1 IMPLANT
SUT SILK 4 0 TIES 17X18 (SUTURE) IMPLANT
SUT VIC AB 4-0 RB1 27 (SUTURE)
SUT VIC AB 4-0 RB1 27X BRD (SUTURE) IMPLANT
SUT VICRYL 6 0 RB 1 (SUTURE) IMPLANT
SYR CONTROL 10ML LL (SYRINGE) ×2 IMPLANT
TOWEL OR 17X26 10 PK STRL BLUE (TOWEL DISPOSABLE) ×2 IMPLANT
TRAY DSU PREP LF (CUSTOM PROCEDURE TRAY) ×2 IMPLANT
TUBE CONNECTING 12X1/4 (SUCTIONS) ×2 IMPLANT
WATER STERILE IRR 3000ML UROMA (IV SOLUTION) IMPLANT
WATER STERILE IRR 500ML POUR (IV SOLUTION) ×2 IMPLANT
YANKAUER SUCT BULB TIP NO VENT (SUCTIONS) ×2 IMPLANT

## 2019-07-14 NOTE — Op Note (Signed)
PATIENT:  Adrian [name]  PRE-OPERATIVE DIAGNOSIS: Right hydrocele  POST-OPERATIVE DIAGNOSIS:  Same  PROCEDURE:  Procedure(s): Right hydrocelectomy  SURGEON:  Claybon Jabs  INDICATION: Adrian [name] is a 69 year old male with a symptomatic right hydrocele.  He had undergone a scrotal ultrasound in 11/19 which revealed normal testicles and bilateral hydroceles with the right being much larger than the left.  Since that time he is noted increased size of his right hydrocele and also had some pain on the right-hand side with further swelling.  We discussed surgical management and I indicated that because he did have a hydrocele that was somewhat smaller on the left-hand side they could both be addressed at the same time but he indicated that he only wanted to have his right hydrocele treated.  ANESTHESIA:  General  EBL:  Minimal  DRAINS: Quarter inch Penrose drain exiting the dependent portion of the right hemiscrotum  LOCAL MEDICATIONS USED: Half percent Marcaine  SPECIMEN:  None  DISPOSITION OF SPECIMEN:  N/A  Description of procedure: After informed consent the patient was brought to the major OR, placed on the table and administered general anesthesia. His genitalia was then sterilely prepped and draped. An official timeout was then performed.  A midline median raphae scrotal incision was then made and carried down over the right hydrocele. The tissue over the hydrocele was cleared using a combination of sharp and blunt technique. The hydrocele was then opened, drained of clear amber fluid and delivered through the incision. The excess hydrocele sac tissue was excised with the Bovie electrocautery and then I cauterized the edges. I then reapproximated the edges posteriorly behind the epididymis with a running, locking 3-0 chromic suture. The appendix testis was removed with the Bovie.   His testicle was then replaced in the normal anatomic position and his right hemiscrotum.  There is seem  to be a little bit more oozing than usual.  I therefore felt he would benefit from the placement of a Penrose drain to prevent any blood or serous fluid from collecting within the potential space.  I therefore made an incision in the dependent portion of the right hemiscrotum.  I used a right angle clamp and passed this through the incision and into the inside of the scrotum where I grasped the quarter inch Penrose and brought it out through the stab incision.  I secured this to the skin with a 3-0 nylon suture.  The remaining portion of the Penrose drain was placed in the dependent portion of the right hemiscrotum.    I then closed the deep scrotal tissue with running 3-0 chromic suture in a locking fashion. I injected half percent Marcaine in the subcutaneous tissue and closed the skin with running 3-0 chromic. Neosporin, a sterile gauze dressing, fluff Kerlix and a scrotal support were applied. The patient tolerated the procedure well no intraoperative complications. Needle sponge and instrument counts were correct at the end of the operation.   PLAN OF CARE: Discharge to home after PACU  PATIENT DISPOSITION:  PACU - hemodynamically stable.

## 2019-07-14 NOTE — Anesthesia Postprocedure Evaluation (Signed)
Anesthesia Post Note  Patient: Adrian [name]  Procedure(s) Performed: HYDROCELECTOMY ADULT (Right )     Patient location during evaluation: PACU Anesthesia Type: General Level of consciousness: awake and alert Pain management: pain level controlled Vital Signs Assessment: post-procedure vital signs reviewed and stable Respiratory status: spontaneous breathing, nonlabored ventilation, respiratory function stable and patient connected to nasal cannula oxygen Cardiovascular status: blood pressure returned to baseline and stable Postop Assessment: no apparent nausea or vomiting Anesthetic complications: no    Last Vitals:  Vitals:   07/14/19 1115 07/14/19 1145  BP: (!) 153/77 (!) 141/79  Pulse: 88 89  Resp: 17 16  Temp: 36.6 C 36.6 C  SpO2: 100% 99%    Last Pain:  Vitals:   07/14/19 1145  TempSrc:   PainSc: 2                  Tiajuana Amass

## 2019-07-14 NOTE — Discharge Instructions (Signed)
Scrotal surgery postoperative instructions  Wound:  In most cases your incision will have absorbable sutures that will dissolve within the first 10-20 days. Some will fall out even earlier. Expect some redness as the sutures dissolved but this should occur only around the sutures. If there is generalized redness, especially with increasing pain or swelling, let us know. The scrotum will very likely get "black and blue" as the blood in the tissues spread. Sometimes the whole scrotum will turn colors. The black and blue is followed by a yellow and brown color. In time, all the discoloration will go away. In some cases some firm swelling in the area of the testicle may persist for up to 4-6 weeks after the surgery and is considered normal in most cases.  Diet:  You may return to your normal diet within 24 hours following your surgery. You may note some mild nausea and possibly vomiting the first 6-8 hours following surgery. This is usually due to the side effects of anesthesia, and will disappear quite soon. I would suggest clear liquids and a very light meal the first evening following your surgery.  Activity:  Your physical activity should be restricted the first 48 hours. During that time you should remain relatively inactive, moving about only when necessary. During the first 7-10 days following surgery he should avoid lifting any heavy objects (anything greater than 15 pounds), and avoid strenuous exercise. If you work, ask us specifically about your restrictions, both for work and home. We will write a note to your employer if needed.  You should plan to wear a tight pair of jockey shorts or an athletic supporter for the first 4-5 days, even to sleep. This will keep the scrotum immobilized to some degree and keep the swelling down.  Ice packs should be placed on and off over the scrotum for the first 48 hours. Frozen peas or corn in a ZipLock bag can be frozen, used and re-frozen. Fifteen minutes  on and 15 minutes off is a reasonable schedule. The ice is a good pain reliever and keeps the swelling down.  Hygiene:  You may shower 48 hours after your surgery. Tub bathing should be restricted until the seventh day.   Medication:  You will be sent home with some type of pain medication. In many cases you will be sent home with a narcotic pain pill (hydrococone or oxycodone). If the pain is not too bad, you may take either Tylenol (acetaminophen) or Advil (ibuprofen) which contain no narcotic agents, and might be tolerated a little better, with fewer side effects. If the pain medication you are sent home with does not control the pain, you will have to let us know. Some narcotic pain medications cannot be given or refilled by a phone call to a pharmacy.  Problems you should report to us:   Fever of 101.0 degrees Fahrenheit or greater.  Moderate or severe swelling under the skin incision or involving the scrotum.  Drug reaction such as hives, a rash, nausea or vomiting.    Post Anesthesia Home Care Instructions  Activity: Get plenty of rest for the remainder of the day. A responsible individual must stay with you for 24 hours following the procedure.  For the next 24 hours, DO NOT: -Drive a car -Operate machinery -Drink alcoholic beverages -Take any medication unless instructed by your physician -Make any legal decisions or sign important papers.  Meals: Start with liquid foods such as gelatin or soup. Progress to regular foods as   tolerated. Avoid greasy, spicy, heavy foods. If nausea and/or vomiting occur, drink only clear liquids until the nausea and/or vomiting subsides. Call your physician if vomiting continues.  Special Instructions/Symptoms: Your throat may feel dry or sore from the anesthesia or the breathing tube placed in your throat during surgery. If this causes discomfort, gargle with warm salt water. The discomfort should disappear within 24 hours.  

## 2019-07-14 NOTE — Anesthesia Preprocedure Evaluation (Addendum)
Anesthesia Evaluation  Patient identified by MRN, date of birth, ID band Patient awake    Reviewed: Allergy & Precautions, NPO status , Patient's Chart, lab work & pertinent test results  Airway Mallampati: II  TM Distance: >3 FB Neck ROM: Full    Dental  (+) Dental Advisory Given   Pulmonary former smoker,    breath sounds clear to auscultation       Cardiovascular hypertension, Pt. on medications  Rhythm:Regular Rate:Normal     Neuro/Psych negative neurological ROS     GI/Hepatic negative GI ROS, Neg liver ROS,   Endo/Other  negative endocrine ROS  Renal/GU negative Renal ROS     Musculoskeletal  (+) Arthritis ,   Abdominal   Peds  Hematology negative hematology ROS (+)   Anesthesia Other Findings   Reproductive/Obstetrics                            Anesthesia Physical Anesthesia Plan  ASA: II  Anesthesia Plan: General   Post-op Pain Management:    Induction: Intravenous  PONV Risk Score and Plan: 2 and Ondansetron, Dexamethasone and Treatment may vary due to age or medical condition  Airway Management Planned: LMA  Additional Equipment:   Intra-op Plan:   Post-operative Plan: Extubation in OR  Informed Consent: I have reviewed the patients History and Physical, chart, labs and discussed the procedure including the risks, benefits and alternatives for the proposed anesthesia with the patient or authorized representative who has indicated his/her understanding and acceptance.     Dental advisory given  Plan Discussed with:   Anesthesia Plan Comments:         Anesthesia Quick Evaluation

## 2019-07-14 NOTE — Transfer of Care (Signed)
Immediate Anesthesia Transfer of Care Note  Patient: Adrian [name]  Procedure(s) Performed: HYDROCELECTOMY ADULT (Right )  Patient Location: PACU  Anesthesia Type:General  Level of Consciousness: awake, alert  and oriented  Airway & Oxygen Therapy: Patient Spontanous Breathing and Patient connected to nasal cannula oxygen  Post-op Assessment: Report given to RN and Post -op Vital signs reviewed and stable  Post vital signs: Reviewed and stable  Last Vitals:  Vitals Value Taken Time  BP    Temp    Pulse 86 07/14/19 1042  Resp 14 07/14/19 1042  SpO2 96 % 07/14/19 1042  Vitals shown include unvalidated device data.  Last Pain:  Vitals:   07/14/19 0850  TempSrc: Oral         Complications: No apparent anesthesia complications

## 2019-07-14 NOTE — Anesthesia Procedure Notes (Signed)
Procedure Name: LMA Insertion Date/Time: 07/14/2019 9:46 AM Performed by: Bonney Aid, CRNA Pre-anesthesia Checklist: Patient identified, Emergency Drugs available, Suction available and Patient being monitored Patient Re-evaluated:Patient Re-evaluated prior to induction Oxygen Delivery Method: Circle system utilized Preoxygenation: Pre-oxygenation with 100% oxygen Induction Type: IV induction Ventilation: Mask ventilation without difficulty LMA: LMA inserted LMA Size: 5.0 Number of attempts: 1 Airway Equipment and Method: Bite block Placement Confirmation: positive ETCO2 Tube secured with: Tape Dental Injury: Teeth and Oropharynx as per pre-operative assessment

## 2019-07-24 DIAGNOSIS — M7731 Calcaneal spur, right foot: Secondary | ICD-10-CM | POA: Diagnosis not present

## 2019-07-24 DIAGNOSIS — M7732 Calcaneal spur, left foot: Secondary | ICD-10-CM | POA: Diagnosis not present

## 2019-07-24 DIAGNOSIS — M722 Plantar fascial fibromatosis: Secondary | ICD-10-CM | POA: Diagnosis not present

## 2019-07-24 DIAGNOSIS — M71571 Other bursitis, not elsewhere classified, right ankle and foot: Secondary | ICD-10-CM | POA: Diagnosis not present

## 2019-07-24 DIAGNOSIS — M71572 Other bursitis, not elsewhere classified, left ankle and foot: Secondary | ICD-10-CM | POA: Diagnosis not present

## 2019-07-28 DIAGNOSIS — N433 Hydrocele, unspecified: Secondary | ICD-10-CM | POA: Diagnosis not present

## 2019-10-06 ENCOUNTER — Ambulatory Visit: Payer: Medicare PPO | Admitting: Dermatology

## 2019-10-14 DIAGNOSIS — H40013 Open angle with borderline findings, low risk, bilateral: Secondary | ICD-10-CM | POA: Diagnosis not present

## 2019-10-14 DIAGNOSIS — H43392 Other vitreous opacities, left eye: Secondary | ICD-10-CM | POA: Diagnosis not present

## 2019-10-14 DIAGNOSIS — H02831 Dermatochalasis of right upper eyelid: Secondary | ICD-10-CM | POA: Diagnosis not present

## 2019-10-14 DIAGNOSIS — H02834 Dermatochalasis of left upper eyelid: Secondary | ICD-10-CM | POA: Diagnosis not present

## 2019-11-05 DIAGNOSIS — R1031 Right lower quadrant pain: Secondary | ICD-10-CM | POA: Diagnosis not present

## 2019-12-04 DIAGNOSIS — Z0000 Encounter for general adult medical examination without abnormal findings: Secondary | ICD-10-CM | POA: Diagnosis not present

## 2019-12-04 DIAGNOSIS — Z125 Encounter for screening for malignant neoplasm of prostate: Secondary | ICD-10-CM | POA: Diagnosis not present

## 2019-12-04 DIAGNOSIS — R946 Abnormal results of thyroid function studies: Secondary | ICD-10-CM | POA: Diagnosis not present

## 2019-12-04 DIAGNOSIS — I10 Essential (primary) hypertension: Secondary | ICD-10-CM | POA: Diagnosis not present

## 2019-12-04 DIAGNOSIS — E785 Hyperlipidemia, unspecified: Secondary | ICD-10-CM | POA: Diagnosis not present

## 2019-12-04 DIAGNOSIS — R7301 Impaired fasting glucose: Secondary | ICD-10-CM | POA: Diagnosis not present

## 2019-12-11 DIAGNOSIS — J309 Allergic rhinitis, unspecified: Secondary | ICD-10-CM | POA: Diagnosis not present

## 2019-12-11 DIAGNOSIS — R1031 Right lower quadrant pain: Secondary | ICD-10-CM | POA: Diagnosis not present

## 2019-12-11 DIAGNOSIS — M542 Cervicalgia: Secondary | ICD-10-CM | POA: Diagnosis not present

## 2019-12-11 DIAGNOSIS — R946 Abnormal results of thyroid function studies: Secondary | ICD-10-CM | POA: Diagnosis not present

## 2019-12-11 DIAGNOSIS — Z0000 Encounter for general adult medical examination without abnormal findings: Secondary | ICD-10-CM | POA: Diagnosis not present

## 2019-12-11 DIAGNOSIS — I2510 Atherosclerotic heart disease of native coronary artery without angina pectoris: Secondary | ICD-10-CM | POA: Diagnosis not present

## 2019-12-11 DIAGNOSIS — Z1212 Encounter for screening for malignant neoplasm of rectum: Secondary | ICD-10-CM | POA: Diagnosis not present

## 2019-12-11 DIAGNOSIS — E785 Hyperlipidemia, unspecified: Secondary | ICD-10-CM | POA: Diagnosis not present

## 2019-12-11 DIAGNOSIS — I10 Essential (primary) hypertension: Secondary | ICD-10-CM | POA: Diagnosis not present

## 2019-12-11 DIAGNOSIS — R82998 Other abnormal findings in urine: Secondary | ICD-10-CM | POA: Diagnosis not present

## 2019-12-11 DIAGNOSIS — R7301 Impaired fasting glucose: Secondary | ICD-10-CM | POA: Diagnosis not present

## 2019-12-19 ENCOUNTER — Encounter (HOSPITAL_COMMUNITY): Payer: Self-pay

## 2019-12-19 ENCOUNTER — Emergency Department (HOSPITAL_COMMUNITY): Payer: Medicare PPO

## 2019-12-19 ENCOUNTER — Other Ambulatory Visit: Payer: Self-pay

## 2019-12-19 ENCOUNTER — Emergency Department (HOSPITAL_COMMUNITY)
Admission: EM | Admit: 2019-12-19 | Discharge: 2019-12-19 | Disposition: A | Discharge status: Home / Self Care | Payer: Medicare PPO | Attending: Emergency Medicine | Admitting: Emergency Medicine

## 2019-12-19 DIAGNOSIS — I2510 Atherosclerotic heart disease of native coronary artery without angina pectoris: Secondary | ICD-10-CM | POA: Diagnosis not present

## 2019-12-19 DIAGNOSIS — H814 Vertigo of central origin: Secondary | ICD-10-CM | POA: Insufficient documentation

## 2019-12-19 DIAGNOSIS — Z87891 Personal history of nicotine dependence: Secondary | ICD-10-CM | POA: Insufficient documentation

## 2019-12-19 DIAGNOSIS — Z7982 Long term (current) use of aspirin: Secondary | ICD-10-CM | POA: Insufficient documentation

## 2019-12-19 DIAGNOSIS — I10 Essential (primary) hypertension: Secondary | ICD-10-CM | POA: Insufficient documentation

## 2019-12-19 DIAGNOSIS — Z79899 Other long term (current) drug therapy: Secondary | ICD-10-CM | POA: Insufficient documentation

## 2019-12-19 DIAGNOSIS — R29818 Other symptoms and signs involving the nervous system: Secondary | ICD-10-CM | POA: Diagnosis not present

## 2019-12-19 DIAGNOSIS — R202 Paresthesia of skin: Secondary | ICD-10-CM | POA: Diagnosis not present

## 2019-12-19 DIAGNOSIS — R42 Dizziness and giddiness: Secondary | ICD-10-CM | POA: Insufficient documentation

## 2019-12-19 DIAGNOSIS — R200 Anesthesia of skin: Secondary | ICD-10-CM | POA: Diagnosis not present

## 2019-12-19 LAB — COMPREHENSIVE METABOLIC PANEL
ALT: 60 U/L — ABNORMAL HIGH (ref 0–44)
AST: 39 U/L (ref 15–41)
Albumin: 4.4 g/dL (ref 3.5–5.0)
Alkaline Phosphatase: 56 U/L (ref 38–126)
Anion gap: 11 (ref 5–15)
BUN: 10 mg/dL (ref 8–23)
CO2: 26 mmol/L (ref 22–32)
Calcium: 9.6 mg/dL (ref 8.9–10.3)
Chloride: 103 mmol/L (ref 98–111)
Creatinine, Ser: 0.8 mg/dL (ref 0.61–1.24)
GFR calc Af Amer: >60 mL/min (ref >60)
GFR calc non Af Amer: >60 mL/min (ref >60)
Glucose, Bld: 110 mg/dL — ABNORMAL HIGH (ref 70–99)
Potassium: 3.8 mmol/L (ref 3.5–5.1)
Sodium: 140 mmol/L (ref 135–145)
Total Bilirubin: 1.2 mg/dL (ref 0.3–1.2)
Total Protein: 7.4 g/dL (ref 6.5–8.1)

## 2019-12-19 LAB — CBC
HCT: 48.9 % (ref 39.0–52.0)
Hemoglobin: 15.7 g/dL (ref 13.0–17.0)
MCH: 27.9 pg (ref 26.0–34.0)
MCHC: 32.1 g/dL (ref 30.0–36.0)
MCV: 86.9 fL (ref 80.0–100.0)
Platelets: 225 10*3/uL (ref 150–400)
RBC: 5.63 MIL/uL (ref 4.22–5.81)
RDW: 13.2 % (ref 11.5–15.5)
WBC: 6.6 10*3/uL (ref 4.0–10.5)
nRBC: 0 % (ref 0.0–0.2)

## 2019-12-19 LAB — I-STAT CHEM 8, ED
BUN: 11 mg/dL (ref 8–23)
Calcium, Ion: 1.17 mmol/L (ref 1.15–1.40)
Chloride: 104 mmol/L (ref 98–111)
Creatinine, Ser: 0.8 mg/dL (ref 0.61–1.24)
Glucose, Bld: 104 mg/dL — ABNORMAL HIGH (ref 70–99)
HCT: 48 % (ref 39.0–52.0)
Hemoglobin: 16.3 g/dL (ref 13.0–17.0)
Potassium: 3.8 mmol/L (ref 3.5–5.1)
Sodium: 141 mmol/L (ref 135–145)
TCO2: 25 mmol/L (ref 22–32)

## 2019-12-19 LAB — PROTIME-INR
INR: 1 (ref 0.8–1.2)
Prothrombin Time: 13.2 seconds (ref 11.4–15.2)

## 2019-12-19 LAB — DIFFERENTIAL
Abs Immature Granulocytes: 0.02 10*3/uL (ref 0.00–0.07)
Basophils Absolute: 0.1 10*3/uL (ref 0.0–0.1)
Basophils Relative: 2 %
Eosinophils Absolute: 0.4 10*3/uL (ref 0.0–0.5)
Eosinophils Relative: 6 %
Immature Granulocytes: 0 %
Lymphocytes Relative: 29 %
Lymphs Abs: 1.9 10*3/uL (ref 0.7–4.0)
Monocytes Absolute: 0.6 10*3/uL (ref 0.1–1.0)
Monocytes Relative: 9 %
Neutro Abs: 3.6 10*3/uL (ref 1.7–7.7)
Neutrophils Relative %: 54 %

## 2019-12-19 LAB — ETHANOL: Alcohol, Ethyl (B): <10 mg/dL (ref <10)

## 2019-12-19 LAB — APTT: aPTT: 33 seconds (ref 24–36)

## 2019-12-19 LAB — CBG MONITORING, ED: Glucose-Capillary: 100 mg/dL — ABNORMAL HIGH (ref 70–99)

## 2019-12-19 MED ORDER — MECLIZINE HCL 25 MG PO TABS: 25.0000 mg | ORAL_TABLET | Freq: Three times a day (TID) | ORAL | 0 refills | Status: DC | PRN

## 2019-12-19 MED ORDER — ONDANSETRON 4 MG PO TBDP: 4.0000 mg | ORAL_TABLET | Freq: Three times a day (TID) | ORAL | 0 refills | Status: DC | PRN

## 2019-12-19 MED ORDER — IOHEXOL 350 MG/ML SOLN
75.0000 mL | Freq: Once | INTRAVENOUS | Status: AC | PRN
  Administered 2019-12-19: 75 mL via INTRAVENOUS

## 2019-12-19 NOTE — ED Notes (Signed)
Patient verbalizes understanding of discharge instructions. Opportunity for questioning and answers were provided. Arm band removed by staff, patient discharged from ED. 

## 2019-12-19 NOTE — ED Provider Notes (Signed)
[facility] EMERGENCY DEPARTMENT Provider Note   CSN: [id number] Arrival date & time: 12/19/19  1749  An emergency department physician performed an initial assessment on this suspected stroke patient at 1647.  History Chief Complaint  Patient presents with  . Stroke Symptoms    Adrian [name] is a 69 y.o. male.  69 year old male with prior medical history as detailed below presents for evaluation of dizziness with associated right sided facial tingling.  Patient reports prior history of vertigo.  He reports 3 days of persistent dizziness and vertiginous symptoms consistent with prior episodes of benign positional vertigo.  He tried meclizine at home with some improvement in his dizziness.  This afternoon he developed right-sided facial numbness and tingling.  He denies other focal complaints or weakness.  He is ambulatory.  He denies visual changes, speech change, or focal weakness.  Patient was evaluated by PIT.  Patient was called as a code stroke.  Neuro team has evaluated the patient emergently.  They recommend additional MRI imaging in the ED to rule out organic cause of patient's reported symptoms.  The history is provided by the patient and medical records.  Illness Location:  Dizziness, right facial numbness  Severity:  Mild Onset quality:  Gradual Duration:  3 days Timing:  Constant Progression:  Waxing and waning Chronicity:  New Associated symptoms: no fever, no headaches and no shortness of breath        Past Medical History:  Diagnosis Date  . Arthritis   . Atypical nevus 04/20/2004   left low back (wider shave)  . Basal cell carcinoma 01/16/1995   left back (cx3 70fu exc)  . BCC (basal cell carcinoma of skin) 10/13/2002   left supra brow (cx3 fu exc)  . BCC (basal cell carcinoma of skin) 04/20/2004   center upper back (cx3 93fu)  . BCC (basal cell carcinoma of skin) 04/24/2006   upper mid back (cx3 66fu)  . BCC (basal cell carcinoma of skin)  10/28/2012   left upper back (cx3 31fu)  . BCC (basal cell carcinoma of skin) 10/28/2012   right back upper (cx3 44fu)  . BCC (basal cell carcinoma of skin) 10/28/2012   right deltoid (cx3 90fu)  . BCC (basal cell carcinoma of skin) 10/25/2015   right chest no treatment basaliod neoplasm  . DJD (degenerative joint disease)   . Hydrocele   . Hypertension   . Right hydrocele 2021  . 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   left temple (cx3 35fu)  . Seasonal allergies     Patient Active Problem List   Diagnosis Date Noted  . Coronary artery disease due to calcified coronary lesion 07/02/2018  . Essential hypertension 07/02/2018  . Pure hypercholesterolemia 07/02/2018    Past Surgical History:  Procedure Laterality Date  . APPENDECTOMY     hemorrhaged post op  . BACK SURGERY    . [place] SURGERY  1995  . HERNIA REPAIR     umbilical  . HYDROCELE EXCISION Right 07/14/2019   Procedure: HYDROCELECTOMY ADULT;  Surgeon: Kathie Rhodes, MD;  Location: [facility] [address];  Service: Urology;  Laterality: Right;  . KNEE ARTHROSCOPY  2010   lt  . LUMBAR LAMINECTOMY  2008  . SEPTOPLASTY         No family history on file.  Social History   Tobacco Use  . Smoking status: Former Smoker    Types: Cigarettes    Start date: 04/03/1972    Quit  date: 04/03/1973    Years since quitting: 46.7  . Smokeless tobacco: Never Used  Substance Use Topics  . Alcohol use: Not Currently  . Drug use: No    Home Medications Prior to Admission medications   Medication Sig Start Date End Date Taking? Authorizing Provider  aspirin EC 81 MG tablet Take 81 mg by mouth daily.    [provider]  HYDROcodone-acetaminophen (NORCO) 10-325 MG tablet Take 1-2 tablets by mouth every 4 (four) hours as needed for moderate pain. Maximum dose per 24 hours - 8 pills 07/14/19   Kathie Rhodes, MD  ibuprofen (ADVIL) 200 MG tablet Take 200 mg by mouth every 6 (six) hours as needed.     [provider]  olmesartan (BENICAR) 20 MG tablet Take 20 mg by mouth daily.    [provider]  Rosuvastatin Calcium 20 MG CPSP Take 20 mg by mouth daily.    [provider]    Allergies    Patient has no known allergies.  Review of Systems   Review of Systems  Constitutional: Negative for fever.  Respiratory: Negative for shortness of breath.   Neurological: Negative for headaches.  All other systems reviewed and are negative.   Physical Exam Updated Vital Signs BP (!) 163/93   Pulse 93   Temp 97.9 F (36.6 C) (Oral)   Resp 18   SpO2 100%   Physical Exam Vitals and nursing note reviewed.  Constitutional:      General: He is not in acute distress.    Appearance: Normal appearance. He is well-developed.  HENT:     Head: Normocephalic and atraumatic.     Ears:     Comments: Scant effusion noted behind right TM - no erythema or loss of landmarks noted Eyes:     Conjunctiva/sclera: Conjunctivae normal.     Pupils: Pupils are equal, round, and reactive to light.  Cardiovascular:     Rate and Rhythm: Normal rate and regular rhythm.     Heart sounds: Normal heart sounds.  Pulmonary:     Effort: Pulmonary effort is normal. No respiratory distress.     Breath sounds: Normal breath sounds.  Abdominal:     General: There is no distension.     Palpations: Abdomen is soft.     Tenderness: There is no abdominal tenderness.  Musculoskeletal:        General: No deformity. Normal range of motion.     Cervical back: Normal range of motion and neck supple.  Skin:    General: Skin is warm and dry.  Neurological:     General: No focal deficit present.     Mental Status: He is alert and oriented to person, place, and time.     Cranial Nerves: No cranial nerve deficit.     Sensory: No sensory deficit.     Motor: No weakness.     Coordination: Coordination normal.     Comments: AOX4 Normal speech No facial droop   Decreased sensation (tingling)  reported over right cheek  5/5 strength to BUE/BLE     ED Results / Procedures / Treatments   Labs (all labs ordered are listed, but only abnormal results are displayed) Labs Reviewed  COMPREHENSIVE METABOLIC PANEL - Abnormal; Notable for the following components:      Result Value   Glucose, Bld 110 (*)    ALT 60 (*)    All other components within normal limits  I-STAT CHEM 8, ED - Abnormal; Notable for  the following components:   Glucose, Bld 104 (*)    All other components within normal limits  CBG MONITORING, ED - Abnormal; Notable for the following components:   Glucose-Capillary 100 (*)    All other components within normal limits  ETHANOL  PROTIME-INR  APTT  CBC  DIFFERENTIAL  RAPID URINE DRUG SCREEN, HOSP PERFORMED  URINALYSIS, ROUTINE W REFLEX MICROSCOPIC    EKG EKG Interpretation  Date/Time:  Friday December 19 2019 16:38:52 EDT Ventricular Rate:  78 PR Interval:  158 QRS Duration: 98 QT Interval:  380 QTC Calculation: 433 R Axis:   39 Text Interpretation: Normal sinus rhythm Incomplete right bundle branch block Borderline ECG Confirmed by Dene Gentry [phone]) on 12/19/2019 4:51:02 PM   Radiology CT Code Stroke CTA Head W/WO contrast  Result Date: 12/19/2019 CLINICAL DATA:  69 year old male code stroke presentation right side numbness. EXAM: CT ANGIOGRAPHY HEAD AND NECK TECHNIQUE: Multidetector CT imaging of the head and neck was performed using the standard protocol during bolus administration of intravenous contrast. Multiplanar CT image reconstructions and MIPs were obtained to evaluate the vascular anatomy. Carotid stenosis measurements (when applicable) are obtained utilizing NASCET criteria, using the distal internal carotid diameter as the denominator. CONTRAST:  66mL OMNIPAQUE IOHEXOL 350 MG/ML SOLN COMPARISON:  Plain head CT 1657 hours today. FINDINGS: CTA NECK Skeleton: Widespread cervical spine degeneration. Bulky facet and endplate spurring. No  acute osseous abnormality identified. Upper chest: Negative. Other neck: No acute findings in the neck. Aortic arch: 3 vessel arch configuration. Mild for age arch atherosclerosis. Right carotid system: Mildly tortuous right CCA. Otherwise negative. Left carotid system: Negative. Vertebral arteries: Mild plaque in the proximal right subclavian artery without stenosis. Normal right vertebral artery origin. Mildly non dominant right vertebral artery is patent and normal to the skull base. No proximal left subclavian artery plaque. Normal left vertebral artery origin. Mildly dominant left vertebral artery is normal to the skull base. CTA HEAD Posterior circulation: Normal PICA origins, both occur somewhat early. No distal vertebral artery plaque or stenosis. The right V4 segment is diminutive beyond the PICA. Normal vertebrobasilar junction. Normal basilar artery. Normal SCA and right PCA origins. Fetal type left PCA origin. Right posterior communicating artery diminutive or absent. Bilateral PCA branches are within normal limits. Anterior circulation: Both ICA siphons are patent with no plaque or stenosis. Normal ophthalmic and left posterior communicating artery origins. Patent carotid termini. Dominant right and diminutive left ACA A1 segments. Normal MCA and ACA origins. Anterior communicating artery and bilateral ACA branches are within normal limits. The right ACA remains somewhat dominant throughout. Left MCA M1 segment and bifurcation are patent without stenosis. Left MCA branches are within normal limits. Right MCA M1 segment and bifurcation are patent without stenosis. Right MCA branches are within normal limits. Other findings: Delayed phase CTA images demonstrate no additional findings. Venous sinuses: Patent. The right transverse and sigmoid sinuses are dominant. Anatomic variants: Mildly dominant left vertebral artery, the right functionally terminates in PICA. Dominant right ACA. Dominant right  transverse and sigmoid sinuses. Review of the MIP images confirms the above findings IMPRESSION: 1. Negative for large vessel occlusion. 2. Minimal atherosclerosis for age, with no arterial stenosis identified. 3. Widespread cervical spine degeneration. These results were communicated to Dr. Leonel Ramsay at 5:26 pm on 12/19/2019 by text page via the [facility] messaging system. Electronically Signed   By: Genevie Ann M.D.   On: 12/19/2019 17:26   CT Code Stroke CTA Neck W/WO contrast  Result Date: 12/19/2019  CLINICAL DATA:  69 year old male code stroke presentation right side numbness. EXAM: CT ANGIOGRAPHY HEAD AND NECK TECHNIQUE: Multidetector CT imaging of the head and neck was performed using the standard protocol during bolus administration of intravenous contrast. Multiplanar CT image reconstructions and MIPs were obtained to evaluate the vascular anatomy. Carotid stenosis measurements (when applicable) are obtained utilizing NASCET criteria, using the distal internal carotid diameter as the denominator. CONTRAST:  26mL OMNIPAQUE IOHEXOL 350 MG/ML SOLN COMPARISON:  Plain head CT 1657 hours today. FINDINGS: CTA NECK Skeleton: Widespread cervical spine degeneration. Bulky facet and endplate spurring. No acute osseous abnormality identified. Upper chest: Negative. Other neck: No acute findings in the neck. Aortic arch: 3 vessel arch configuration. Mild for age arch atherosclerosis. Right carotid system: Mildly tortuous right CCA. Otherwise negative. Left carotid system: Negative. Vertebral arteries: Mild plaque in the proximal right subclavian artery without stenosis. Normal right vertebral artery origin. Mildly non dominant right vertebral artery is patent and normal to the skull base. No proximal left subclavian artery plaque. Normal left vertebral artery origin. Mildly dominant left vertebral artery is normal to the skull base. CTA HEAD Posterior circulation: Normal PICA origins, both occur somewhat early. No distal  vertebral artery plaque or stenosis. The right V4 segment is diminutive beyond the PICA. Normal vertebrobasilar junction. Normal basilar artery. Normal SCA and right PCA origins. Fetal type left PCA origin. Right posterior communicating artery diminutive or absent. Bilateral PCA branches are within normal limits. Anterior circulation: Both ICA siphons are patent with no plaque or stenosis. Normal ophthalmic and left posterior communicating artery origins. Patent carotid termini. Dominant right and diminutive left ACA A1 segments. Normal MCA and ACA origins. Anterior communicating artery and bilateral ACA branches are within normal limits. The right ACA remains somewhat dominant throughout. Left MCA M1 segment and bifurcation are patent without stenosis. Left MCA branches are within normal limits. Right MCA M1 segment and bifurcation are patent without stenosis. Right MCA branches are within normal limits. Other findings: Delayed phase CTA images demonstrate no additional findings. Venous sinuses: Patent. The right transverse and sigmoid sinuses are dominant. Anatomic variants: Mildly dominant left vertebral artery, the right functionally terminates in PICA. Dominant right ACA. Dominant right transverse and sigmoid sinuses. Review of the MIP images confirms the above findings IMPRESSION: 1. Negative for large vessel occlusion. 2. Minimal atherosclerosis for age, with no arterial stenosis identified. 3. Widespread cervical spine degeneration. These results were communicated to Dr. Leonel Ramsay at 5:26 pm on 12/19/2019 by text page via the [facility] messaging system. Electronically Signed   By: Genevie Ann M.D.   On: 12/19/2019 17:26   CT HEAD CODE STROKE WO CONTRAST  Result Date: 12/19/2019 CLINICAL DATA:  Code stroke. 69 year old male with right side numbness and tingling onset 1330 hours. EXAM: CT HEAD WITHOUT CONTRAST TECHNIQUE: Contiguous axial images were obtained from the base of the skull through the vertex without  intravenous contrast. COMPARISON:  Face CT 06/04/2018. FINDINGS: Brain: No midline shift, mass effect, or evidence of intracranial mass lesion. No acute intracranial hemorrhage identified. No ventriculomegaly. Largely normal for age gray-white matter differentiation throughout the brain. Minimal scattered white matter hypodensity. No cortical encephalomalacia identified. No cortically based acute infarct identified. Vascular: Minimal Calcified atherosclerosis at the skull base. No suspicious intracranial vascular hyperdensity. Skull: No acute osseous abnormality identified. Sinuses/Orbits: Chronic paranasal sinus mucoperiosteal thickening is not significantly changed. Tympanic cavities and mastoids remain clear. Other: No acute orbit or scalp soft tissue finding. ASPECTS [facility] Stroke Program Early CT Score)  Total score (0-10 with 10 being normal): 10 IMPRESSION: 1. Negative for age noncontrast CT appearance of the brain. ASPECTS 10. 2. Chronic paranasal sinus disease. 3. These results were communicated to Dr. Leonel Ramsay at 5:08 pm on 12/19/2019 by text page via the [facility] messaging system. Electronically Signed   By: Genevie Ann M.D.   On: 12/19/2019 17:09    Procedures Procedures (including critical care time)  Medications Ordered in ED Medications  iohexol (OMNIPAQUE) 350 MG/ML injection 75 mL (75 mLs Intravenous Contrast Given 12/19/19 1703)    ED Course  I have reviewed the triage vital signs and the nursing notes.  Pertinent labs & imaging results that were available during my care of the patient were reviewed by me and considered in my medical decision making (see chart for details).    MDM Rules/Calculators/A&P                          MDM  Screen complete  Adrian [name] was evaluated in Emergency Department on 12/19/2019 for the symptoms described in the history of present illness. He was evaluated in the context of the global COVID-19 pandemic, which necessitated consideration that the  patient might be at risk for infection with the SARS-CoV-2 virus that causes COVID-19. Institutional protocols and algorithms that pertain to the evaluation of patients at risk for COVID-19 are in a state of rapid change based on information released by regulatory bodies including the CDC and federal and state organizations. These policies and algorithms were followed during the patient's care in the ED.  Patient presented for evaluation of dizziness with associated right sided facial tingling.  CT and MR imaging did not reveal evidence of acute CVA.  Patient symptoms are most likely related to positional vertigo.  Patient desires discharge home.  He does understand need for close follow-up.  He will continue to use meclizine at home.  Importance of close follow-up is stressed.  Strict return precautions given and understood.  Final Clinical Impression(s) / ED Diagnoses Final diagnoses:  Vertigo    Rx / DC Orders ED Discharge Orders         Ordered    ondansetron (ZOFRAN ODT) 4 MG disintegrating tablet  Every 8 hours PRN        12/19/19 1832    meclizine (ANTIVERT) 25 MG tablet  3 times daily PRN        12/19/19 1832           Valarie Merino, MD 12/19/19 [phone]

## 2019-12-19 NOTE — ED Triage Notes (Signed)
Emergency Medicine Provider Triage Evaluation Note  Adrian [name] , a 69 y.o. male  was evaluated in triage.  Pt complains of right-sided facial tingling and numbness.  Patient states his symptoms began at 130 this afternoon.  They have been persistent since.  He denies numbness or tingling in any extremity.  Over the past 24 hours, he has had significant dizziness and nausea, treated with meclizine.  Review of Systems  Positive: Dizziness, nausea, r sided decreased sensation of face Negative: Cp, sob, ext weakness  Physical Exam  BP (!) 163/93    Pulse 93    Temp 97.9 F (36.6 C) (Oral)    Resp 18    SpO2 100%  Gen:   Awake, no distress   HEENT:  Atraumatic  Resp:  Normal effort  Cardiac:  Normal rate  Abd:   Nondistended, nontender  MSK:   Moves extremities without difficulty  Neuro:  Pt reports decreased sensation of R side face. CN otherwise intact. Nose to finger intact. Speech clear. Gait normal (caused worsened dizziness)  Medical Decision Making  Medically screening exam initiated at 4:39 PM.  Appropriate orders placed.  Adrian [name] was informed that the remainder of the evaluation will be completed by another provider, this initial triage assessment does not replace that evaluation, and the importance of remaining in the ED until their evaluation is complete.  Clinical Impression   Patient with acute right-sided facial sensation loss/change within stroke window.  Also reporting dizziness.  Discussed with attending, Dr. Francia Greaves, code stroke called.   Franchot Heidelberg, PA-C 12/19/19 1641

## 2019-12-19 NOTE — Code Documentation (Signed)
Pt is a 69 yr old male presenting to ED for evaluation of rt sided facial numbness, which started at 1330 He arrived at 1626, and code stroke was called at 1649. Pt is a NIHSS of 1, for rt  sensory deficit. He was taken to CT at 1650. NCCT normal per neurologist. Pt also mentions that he has been dizzy for several days, therefore, his LKW is now outside of the window for either TPA or IR. CTA completed as well. Further workup will include MRI. Pt will need q 2 hr mNIHSS and vital signs. Bedside handoff complete with ED RN.

## 2019-12-19 NOTE — H&P (Signed)
Neurology Consultation  Reason for Consult: Code stroke Referring Physician: Dr. Francia Greaves  CC: R facial/RUE numbness and dizziness  History is obtained from: patient  HPI: SAVERIO KADER is a 69 y.o. male pertinent risk factor in PHx  HTN, BCC/SCC, DJD, CAD, hypercholesterolemia presents with 3 day history of dizziness and nausea (tx'd with meclizine) that progressed to R facial numbness/tingling today at approximately 1330. Code stroke was called and neurology was consulted.   CT head and CTA head/neck were both unremarkable. Patient to undergo MRI for further evaluation.  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  LKW: 3 days ago tpa given?: no, outside window   Premorbid modified Rankin scale (mRS):  1-No significant post stroke disability and can perform usual duties with stroke symptoms  Past Medical History:  Diagnosis Date  . Arthritis   . Atypical nevus 04/20/2004   left low back (wider shave)  . Basal cell carcinoma 01/16/1995   left back (cx3 44fu exc)  . BCC (basal cell carcinoma of skin) 10/13/2002   left supra brow (cx3 fu exc)  . BCC (basal cell carcinoma of skin) 04/20/2004   center upper back (cx3 58fu)  . BCC (basal cell carcinoma of skin) 04/24/2006   upper mid back (cx3 22fu)  . BCC (basal cell carcinoma of skin) 10/28/2012   left upper back (cx3 21fu)  . BCC (basal cell carcinoma of skin) 10/28/2012   right back upper (cx3 78fu)  . BCC (basal cell carcinoma of skin) 10/28/2012   right deltoid (cx3 20fu)  . BCC (basal cell carcinoma of skin) 10/25/2015   right chest no treatment basaliod neoplasm  . DJD (degenerative joint disease)   . Hydrocele   . Hypertension   . Right hydrocele 2021  . 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   left temple (cx3 46fu)  . Seasonal allergies    1-No significant post stroke disability and can perform usual duties with  stroke symptoms Essential (primary) hypertension and Obesity   No family history on file.  Social History:   reports that he quit smoking about 46 years ago. His smoking use included cigarettes. He started smoking about 47 years ago. He has never used smokeless tobacco. He reports previous alcohol use. He reports that he does not use drugs.  Medications No current facility-administered medications for this encounter.  Current Outpatient Medications:  .  aspirin EC 81 MG tablet, Take 81 mg by mouth daily., Disp: , Rfl:  .  HYDROcodone-acetaminophen (NORCO) 10-325 MG tablet, Take 1-2 tablets by mouth every 4 (four) hours as needed for moderate pain. Maximum dose per 24 hours - 8 pills, Disp: 20 tablet, Rfl: 0 .  ibuprofen (ADVIL) 200 MG tablet, Take 200 mg by mouth every 6 (six) hours as needed., Disp: , Rfl:  .  olmesartan (BENICAR) 20 MG tablet, Take 20 mg by mouth daily., Disp: , Rfl:  .  Rosuvastatin Calcium 20 MG CPSP, Take 20 mg by mouth daily., Disp: , Rfl:   ROS:  General ROS: negative for - chills, fatigue, fever, night sweats, weight gain or weight loss Psychological ROS: negative for - behavioral disorder, hallucinations, memory difficulties, mood swings or suicidal ideation Ophthalmic ROS: negative for - blurry vision, double vision, eye pain or loss of vision ENT ROS: negative for - epistaxis, nasal discharge, oral lesions, sore throat, tinnitus or vertigo Allergy and Immunology ROS: negative for -  hives or itchy/watery eyes Hematological and Lymphatic ROS: negative for - bleeding problems, bruising or swollen lymph nodes Endocrine ROS: negative for - galactorrhea, hair pattern changes, polydipsia/polyuria or temperature intolerance Respiratory ROS: negative for - cough, hemoptysis, shortness of breath or wheezing Cardiovascular ROS: negative for - chest pain, dyspnea on exertion, edema or irregular heartbeat Gastrointestinal ROS: negative for - abdominal pain, diarrhea,  hematemesis, nausea/vomiting or stool incontinence Genito-Urinary ROS: negative for - dysuria, hematuria, incontinence or urinary frequency/urgency Musculoskeletal ROS: chronic numbness of RLE d/t L4-5 DJD Neurological ROS: as noted in HPI Dermatological ROS: negative for rash and skin lesion changes  Exam: Current vital signs: BP (!) 163/93   Pulse 93   Temp 97.9 F (36.6 C) (Oral)   Resp 18   SpO2 100%  Vital signs in last 24 hours: Temp:  [97.9 F (36.6 C)] 97.9 F (36.6 C) (09/17 1636) Pulse Rate:  [93] 93 (09/17 1636) Resp:  [18] 18 (09/17 1636) BP: (163)/(93) 163/93 (09/17 1636) SpO2:  [100 %] 100 % (09/17 1636)   Constitutional: Appears well-developed and well-nourished.  Psych: Affect appropriate to situation Eyes: No scleral injection HENT: No OP obstrucion Head: Normocephalic.  Cardiovascular: Normal rate and regular rhythm.  Respiratory: Effort normal, non-labored breathing GI: Soft.  No distension. There is no tenderness.  Skin: WDI  Neuro: Mental Status: Patient is awake, alert, oriented to person, place, month, year, and situation Speech- intact naming,  repeating  comprehension Patient is able to give a clear and coherent history. Cranial Nerves: II: Visual Fields are full.  III,IV, VI: EOMI without ptosis or diploplia. Pupils equal, round and reactive to light V: Facial sensation is symmetric to temperature VII: Facial movement is symmetric.  VIII: hearing is intact to voice X: Palat elevates symmetrically XI: Shoulder shrug is symmetric. XII: tongue is midline without atrophy or fasciculations.  Motor: Tone is normal. Bulk is normal. 5/5 strength was present in all four extremities.  No Drift or aterixis Sensory: Sensation is diminished in R face and RUE. RLE has chronic diminished sensation d/t L4-L5 DJD + C6-C7 surgery with residual numbness of R hand.  pinprick slightly diminished on R side of face. Vibration equal on both sides of face.   Deep Tendon Reflexes: 2+ and symmetric in the biceps and patellae. Plantars: Toes are downgoing bilaterally. Cerebellar: FNF and HKS are intact bilaterally   Labs I have reviewed labs in epic and the results pertinent to this consultation are:  CBC    Component Value Date/Time   WBC 6.6 12/19/2019 1644   RBC 5.63 12/19/2019 1644   HGB 16.3 12/19/2019 1650   HCT 48.0 12/19/2019 1650   PLT 225 12/19/2019 1644   MCV 86.9 12/19/2019 1644   MCH 27.9 12/19/2019 1644   MCHC 32.1 12/19/2019 1644   RDW 13.2 12/19/2019 1644   LYMPHSABS 1.9 12/19/2019 1644   MONOABS 0.6 12/19/2019 1644   EOSABS 0.4 12/19/2019 1644   BASOSABS 0.1 12/19/2019 1644    CMP     Component Value Date/Time   NA 141 12/19/2019 1650   K 3.8 12/19/2019 1650   CL 104 12/19/2019 1650   CO2 22 07/23/2018 1341   GLUCOSE 104 (H) 12/19/2019 1650   BUN 11 12/19/2019 1650   CREATININE 0.80 12/19/2019 1650   CALCIUM 9.5 07/23/2018 1341   PROT 6.1 03/05/2007 0330   ALBUMIN 3.7 03/05/2007 0330   AST 23 03/05/2007 0330   ALT 33 03/05/2007 0330   ALKPHOS 46 03/05/2007 0330  BILITOT 2.3 (H) 03/05/2007 0330   GFRNONAA >60 07/23/2018 1341   GFRAA >60 07/23/2018 1341    Lipid Panel  No results found for: CHOL, TRIG, HDL, CHOLHDL, VLDL, LDLCALC, LDLDIRECT   Imaging I have reviewed the images obtained:  CT-scan of the brain 1. Negative for age noncontrast CT appearance of the brain. ASPECTS 10. 2. Chronic paranasal sinus disease.  MRI examination of the brain pending   Assessment:  JUANLUIS GUASTELLA is a 69 y.o. male pertinent risk factor in PHx  HTN, BCC/SCC, L4-L5 DJD, R hand numbness from hx of C6-C7 surgery, CAD, hypercholesterolemia presents with 3 day history of dizziness that progressed to R facial numbness/tingling today. Patient also states pain in R neck that extends to his R ear/cheek that started today.   Impression: 3 day history of dizziness/nausea with new onset R facial numbness/tingling-  CT/CTA head/neck negative for acute abnormalities.   Recommendations: - Recommend getting MRI brain w/o contrast to further evaluate etiology for symptoms to r/o organic causes - Plan of care to be established following MRI results.

## 2019-12-19 NOTE — ED Triage Notes (Addendum)
Pt arrives to ED w/ c/o numbness and tingling on R side of face that started at 1330 today. Pt AOx4 and otherwise neuro intact. Pt has hx c-6, c-7 surgery which has been causing numbness in his hand for several years. Pt also c/o dizziness x 3 days.

## 2019-12-19 NOTE — Discharge Instructions (Addendum)
Return for any problem.  Follow-up with your regular care provider as instructed. °

## 2019-12-23 ENCOUNTER — Ambulatory Visit: Payer: Medicare PPO | Admitting: Cardiology

## 2020-02-03 ENCOUNTER — Ambulatory Visit: Payer: Medicare PPO | Admitting: Cardiology

## 2020-02-03 ENCOUNTER — Encounter: Payer: Self-pay | Admitting: Cardiology

## 2020-02-03 ENCOUNTER — Other Ambulatory Visit: Payer: Self-pay

## 2020-02-03 VITALS — BP 134/70 | HR 80 | Ht 70.0 in | Wt 198.2 lb

## 2020-02-03 DIAGNOSIS — E7800 Pure hypercholesterolemia, unspecified: Secondary | ICD-10-CM

## 2020-02-03 DIAGNOSIS — I10 Essential (primary) hypertension: Secondary | ICD-10-CM | POA: Diagnosis not present

## 2020-02-03 DIAGNOSIS — Z7189 Other specified counseling: Secondary | ICD-10-CM | POA: Diagnosis not present

## 2020-02-03 DIAGNOSIS — I2510 Atherosclerotic heart disease of native coronary artery without angina pectoris: Secondary | ICD-10-CM | POA: Diagnosis not present

## 2020-02-03 DIAGNOSIS — I2584 Coronary atherosclerosis due to calcified coronary lesion: Secondary | ICD-10-CM | POA: Diagnosis not present

## 2020-02-03 NOTE — Patient Instructions (Signed)

## 2020-02-03 NOTE — Progress Notes (Signed)
Cardiology Office Note:    Date:  02/03/2020   ID:  Adrian [name], DOB October 22, 1950, MRN [id number]  PCP:  Adrian Redwood, MD  Cardiologist:  Adrian Dresser, MD PhD  Referring MD: Adrian Redwood, MD   CC: Follow up  History of Present Illness:    Adrian [name] is a 69 y.o. male with a hx of HTN, HLD, impaired fasting glucose who is seen in follow up today. His initial virtual consult with me was on 07/02/18.  Cardiac history:  Ca score of 2190 Agatson units (95th percentile), with calcifications in LAD, RCA, and LCx. Started on statin 06/10/18.  Today: Having more indigestion, taking 1-3 Tums/day. These relieve the symptoms. Has a history of damaging stomach in the distant past after accidentally ingesting hydrogen peroxide, took 2 years but healed. Discussed talking to his GI doctor to see if he needs a PPI or endoscopy.  Checks BP at home, always higher in office. Has been 110s/60s.   Had Covid in August. Now recovered. Working in the yard but wants to get back to elliptical.  Denies chest pain, shortness of breath at rest or with normal exertion. No PND, orthopnea, LE edema or unexpected weight gain. No syncope or palpitations. Did struggle with vertigo recently.   Reports recent A1c was 5.7. Lipids "in range." I do not have a copy of these labs from Dr. Raul [name] office.  Past Medical History:  Diagnosis Date  . Arthritis   . Atypical nevus 04/20/2004   left low back (wider shave)  . Basal cell carcinoma 01/16/1995   left back (cx3 42fu exc)  . BCC (basal cell carcinoma of skin) 10/13/2002   left supra brow (cx3 fu exc)  . BCC (basal cell carcinoma of skin) 04/20/2004   center upper back (cx3 25fu)  . BCC (basal cell carcinoma of skin) 04/24/2006   upper mid back (cx3 37fu)  . BCC (basal cell carcinoma of skin) 10/28/2012   left upper back (cx3 59fu)  . BCC (basal cell carcinoma of skin) 10/28/2012   right back upper (cx3 75fu)  . BCC (basal cell carcinoma of skin) 10/28/2012    right deltoid (cx3 56fu)  . BCC (basal cell carcinoma of skin) 10/25/2015   right chest no treatment basaliod neoplasm  . DJD (degenerative joint disease)   . Hydrocele   . Hypertension   . Right hydrocele 2021  . 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   left temple (cx3 51fu)  . Seasonal allergies     Past Surgical History:  Procedure Laterality Date  . APPENDECTOMY     hemorrhaged post op  . BACK SURGERY    . [place] SURGERY  1995  . HERNIA REPAIR     umbilical  . HYDROCELE EXCISION Right 07/14/2019   Procedure: HYDROCELECTOMY ADULT;  Surgeon: Adrian Rhodes, MD;  Location: [facility];  Service: Urology;  Laterality: Right;  . KNEE ARTHROSCOPY  2010   lt  . LUMBAR LAMINECTOMY  2008  . SEPTOPLASTY      Current Medications: Current Outpatient Medications on File Prior to Visit  Medication Sig  . aspirin EC 81 MG tablet Take 81 mg by mouth daily.  Marland Kitchen ibuprofen (ADVIL) 200 MG tablet Take 200 mg by mouth every 6 (six) hours as needed.  Marland Kitchen olmesartan (BENICAR) 20 MG tablet Take 20 mg by mouth daily.  . Rosuvastatin Calcium 20 MG CPSP Take 20 mg by mouth daily.  Marland Kitchen HYDROcodone-acetaminophen (NORCO) 10-325 MG tablet  Take 1-2 tablets by mouth every 4 (four) hours as needed for moderate pain. Maximum dose per 24 hours - 8 pills (Patient not taking: Reported on 02/03/2020)  . meclizine (ANTIVERT) 25 MG tablet Take 1 tablet (25 mg total) by mouth 3 (three) times daily as needed for dizziness. (Patient not taking: Reported on 02/03/2020)  . ondansetron (ZOFRAN ODT) 4 MG disintegrating tablet Take 1 tablet (4 mg total) by mouth every 8 (eight) hours as needed for nausea or vomiting. (Patient not taking: Reported on 02/03/2020)   No current facility-administered medications on file prior to visit.     Allergies:   Patient has no known allergies.   Social History   Tobacco Use  . Smoking status: Former Smoker    Types: Cigarettes    Start date: 04/03/1972     Quit date: 04/03/1973    Years since quitting: 46.8  . Smokeless tobacco: Never Used  Substance Use Topics  . Alcohol use: Not Currently  . Drug use: No    Family History: One brother with stroke, one brother with heart issues.  ROS:   Please see the history of present illness.  Additional pertinent ROS otherwise unremarkable.  EKGs/Labs/Other Studies Reviewed:    The following studies were reviewed today: Prior calcium score  EKG:  ECG personally reviewed today. ECG ordered today shows Sinus tach, iRBBB from 07/24/18  Recent Labs: 12/19/2019: ALT 60; BUN 11; Creatinine, Ser 0.80; Hemoglobin 16.3; Platelets 225; Potassium 3.8; Sodium 141  Recent Lipid Panel No results found for: CHOL, TRIG, HDL, CHOLHDL, VLDL, LDLCALC, LDLDIRECT  Physical Exam:    VS:  BP 134/70 (BP Location: Left Arm, Patient Position: Sitting)   Pulse 80   Ht 5\' 10"  (1.778 m)   Wt 198 lb 3.2 oz (89.9 kg)   SpO2 98%   BMI 28.44 kg/m     Wt Readings from Last 3 Encounters:  02/03/20 198 lb 3.2 oz (89.9 kg)  07/14/19 187 lb 8 oz (85 kg)  05/04/19 183 lb (83 kg)    GEN: Well nourished, well developed in no acute distress HEENT: Normal, moist mucous membranes NECK: No JVD CARDIAC: regular rhythm, normal S1 and S2, no rubs or gallops. No murmur. VASCULAR: Radial and DP pulses 2+ bilaterally. No carotid bruits RESPIRATORY:  Clear to auscultation without rales, wheezing or rhonchi  ABDOMEN: Soft, non-tender, non-distended MUSCULOSKELETAL:  Ambulates independently SKIN: Warm and dry, no edema NEUROLOGIC:  Alert and oriented x 3. No focal neuro deficits noted. PSYCHIATRIC:  Normal affect   ASSESSMENT:    1. Coronary artery disease due to calcified coronary lesion   2. Essential hypertension   3. Pure hypercholesterolemia   4. Cardiac risk counseling   5. Counseling on health promotion and disease prevention    PLAN:    Coronary artery disease: diagnosed based on coronary calcium score. -on aspirin  81 mg, tolerating rosuvastatin 20 mg. LDL goal <70.  -risk factor control: excellent lifestyle (no alcohol, no tobacco, heavy exercise, very good diet).  -no diabetes diagnosis, has been borderline with A1c >5.5 -counseled on red flag warning symptoms that need immediate medical attention -have previously reviewed risk of NSAIDs and CAD. He takes ibuprofen only when he absolutely cannot tolerate the MSK pain.  Hypertension: reports good control at home, always elevated at the office. Stopped HCTZ previously. -continue olmesartan  Hypercholesterolemia: -on rosuvastatin 20 mg, I do not have recent lipid results but he states they are at goal  Cardiac risk counseling and prevention  recommendations: -recommend heart healthy/Mediterranean diet, with whole grains, fruits, vegetable, fish, lean meats, nuts, and olive oil. Limit salt. -recommend moderate walking, 3-5 times/week for 30-50 minutes each session. Aim for at least 150 minutes.week. Goal should be pace of 3 miles/hours, or walking 1.5 miles in 30 minutes  Plan for follow up: yearly or sooner as needed  Medication Adjustments/Labs and Tests Ordered: Current medicines are reviewed at length with the patient today.  Concerns regarding medicines are outlined above.  No orders of the defined types were placed in this encounter.  No orders of the defined types were placed in this encounter.  Patient Instructions  Medication Instructions:  Your Physician recommend you continue on your current medication as directed.    *If you need a refill on your cardiac medications before your next appointment, please call your pharmacy*   Lab Work: None ordered   Testing/Procedures: None ordered    Follow-Up: At [facility], you and your health needs are our priority.  As part of our continuing mission to provide you with exceptional heart care, we have created designated Provider Care Teams.  These Care Teams include your primary  Cardiologist (physician) and Advanced Practice Providers (APPs -  Physician Assistants and Nurse Practitioners) who all work together to provide you with the care you need, when you need it.  We recommend signing up for the patient portal called "MyChart".  Sign up information is provided on this After Visit Summary.  MyChart is used to connect with patients for Virtual Visits (Telemedicine).  Patients are able to view lab/test results, encounter notes, upcoming appointments, etc.  Non-urgent messages can be sent to your provider as well.   To learn more about what you can do with MyChart, go to [email or web address].    Your next appointment:   1 year(s)  The format for your next appointment:   In Person  Provider:   Buford Dresser, MD       Signed, Adrian Dresser, MD PhD 02/03/2020  [place]

## 2020-02-09 DIAGNOSIS — Z825 Family history of asthma and other chronic lower respiratory diseases: Secondary | ICD-10-CM | POA: Diagnosis not present

## 2020-02-09 DIAGNOSIS — M1990 Unspecified osteoarthritis, unspecified site: Secondary | ICD-10-CM | POA: Diagnosis not present

## 2020-02-09 DIAGNOSIS — Z85828 Personal history of other malignant neoplasm of skin: Secondary | ICD-10-CM | POA: Diagnosis not present

## 2020-02-09 DIAGNOSIS — M792 Neuralgia and neuritis, unspecified: Secondary | ICD-10-CM | POA: Diagnosis not present

## 2020-02-09 DIAGNOSIS — M4800 Spinal stenosis, site unspecified: Secondary | ICD-10-CM | POA: Diagnosis not present

## 2020-02-09 DIAGNOSIS — I10 Essential (primary) hypertension: Secondary | ICD-10-CM | POA: Diagnosis not present

## 2020-02-09 DIAGNOSIS — I2510 Atherosclerotic heart disease of native coronary artery without angina pectoris: Secondary | ICD-10-CM | POA: Diagnosis not present

## 2020-02-09 DIAGNOSIS — G8929 Other chronic pain: Secondary | ICD-10-CM | POA: Diagnosis not present

## 2020-02-09 DIAGNOSIS — Z8249 Family history of ischemic heart disease and other diseases of the circulatory system: Secondary | ICD-10-CM | POA: Diagnosis not present

## 2020-02-09 DIAGNOSIS — E785 Hyperlipidemia, unspecified: Secondary | ICD-10-CM | POA: Diagnosis not present

## 2020-02-09 DIAGNOSIS — Z7982 Long term (current) use of aspirin: Secondary | ICD-10-CM | POA: Diagnosis not present

## 2020-02-09 DIAGNOSIS — K219 Gastro-esophageal reflux disease without esophagitis: Secondary | ICD-10-CM | POA: Diagnosis not present

## 2020-02-09 DIAGNOSIS — Z87891 Personal history of nicotine dependence: Secondary | ICD-10-CM | POA: Diagnosis not present

## 2020-02-12 DIAGNOSIS — K219 Gastro-esophageal reflux disease without esophagitis: Secondary | ICD-10-CM | POA: Diagnosis not present

## 2020-03-09 ENCOUNTER — Ambulatory Visit: Payer: Medicare PPO | Attending: Internal Medicine

## 2020-03-09 DIAGNOSIS — Z23 Encounter for immunization: Secondary | ICD-10-CM

## 2020-03-09 NOTE — Progress Notes (Signed)
   Covid-19 Vaccination Clinic  Name:  Adrian [name]    MRN: [id number] DOB: 08/29/1950  03/09/2020  [name] was observed post Covid-19 immunization for 15 minutes without incident. He was provided with Vaccine Information Sheet and instruction to access the V-Safe system.   [name] was instructed to call 911 with any severe reactions post vaccine: Marland Kitchen Difficulty breathing  . Swelling of face and throat  . A fast heartbeat  . A bad rash all over body  . Dizziness and weakness   Immunizations Administered    No immunizations on file.

## 2020-04-04 ENCOUNTER — Encounter: Payer: Self-pay | Admitting: Cardiology

## 2020-04-06 DIAGNOSIS — K219 Gastro-esophageal reflux disease without esophagitis: Secondary | ICD-10-CM | POA: Diagnosis not present

## 2020-04-15 DIAGNOSIS — H524 Presbyopia: Secondary | ICD-10-CM | POA: Diagnosis not present

## 2020-04-15 DIAGNOSIS — H2589 Other age-related cataract: Secondary | ICD-10-CM | POA: Diagnosis not present

## 2020-04-15 DIAGNOSIS — H2513 Age-related nuclear cataract, bilateral: Secondary | ICD-10-CM | POA: Diagnosis not present

## 2020-04-15 DIAGNOSIS — H40013 Open angle with borderline findings, low risk, bilateral: Secondary | ICD-10-CM | POA: Diagnosis not present

## 2020-04-15 DIAGNOSIS — H35033 Hypertensive retinopathy, bilateral: Secondary | ICD-10-CM | POA: Diagnosis not present

## 2020-04-22 DIAGNOSIS — M79641 Pain in right hand: Secondary | ICD-10-CM | POA: Diagnosis not present

## 2020-05-24 DIAGNOSIS — M545 Low back pain, unspecified: Secondary | ICD-10-CM | POA: Diagnosis not present

## 2020-05-25 DIAGNOSIS — M545 Low back pain, unspecified: Secondary | ICD-10-CM | POA: Diagnosis not present

## 2020-06-01 DIAGNOSIS — M545 Low back pain, unspecified: Secondary | ICD-10-CM | POA: Diagnosis not present

## 2020-06-03 DIAGNOSIS — M545 Low back pain, unspecified: Secondary | ICD-10-CM | POA: Diagnosis not present

## 2020-06-07 DIAGNOSIS — M545 Low back pain, unspecified: Secondary | ICD-10-CM | POA: Diagnosis not present

## 2020-06-09 DIAGNOSIS — M545 Low back pain, unspecified: Secondary | ICD-10-CM | POA: Diagnosis not present

## 2020-06-14 DIAGNOSIS — M545 Low back pain, unspecified: Secondary | ICD-10-CM | POA: Diagnosis not present

## 2020-06-16 DIAGNOSIS — M545 Low back pain, unspecified: Secondary | ICD-10-CM | POA: Diagnosis not present

## 2020-06-17 DIAGNOSIS — M545 Low back pain, unspecified: Secondary | ICD-10-CM | POA: Diagnosis not present

## 2020-06-21 DIAGNOSIS — M545 Low back pain, unspecified: Secondary | ICD-10-CM | POA: Diagnosis not present

## 2020-06-23 DIAGNOSIS — M545 Low back pain, unspecified: Secondary | ICD-10-CM | POA: Diagnosis not present

## 2020-06-28 DIAGNOSIS — M545 Low back pain, unspecified: Secondary | ICD-10-CM | POA: Diagnosis not present

## 2020-07-07 DIAGNOSIS — M545 Low back pain, unspecified: Secondary | ICD-10-CM | POA: Diagnosis not present

## 2020-07-12 DIAGNOSIS — M545 Low back pain, unspecified: Secondary | ICD-10-CM | POA: Diagnosis not present

## 2020-07-14 DIAGNOSIS — M545 Low back pain, unspecified: Secondary | ICD-10-CM | POA: Diagnosis not present

## 2020-07-21 DIAGNOSIS — M545 Low back pain, unspecified: Secondary | ICD-10-CM | POA: Diagnosis not present

## 2020-07-26 DIAGNOSIS — M545 Low back pain, unspecified: Secondary | ICD-10-CM | POA: Diagnosis not present

## 2020-07-28 DIAGNOSIS — M545 Low back pain, unspecified: Secondary | ICD-10-CM | POA: Diagnosis not present

## 2020-08-02 DIAGNOSIS — M545 Low back pain, unspecified: Secondary | ICD-10-CM | POA: Diagnosis not present

## 2020-08-04 DIAGNOSIS — K648 Other hemorrhoids: Secondary | ICD-10-CM | POA: Diagnosis not present

## 2020-08-06 DIAGNOSIS — M25562 Pain in left knee: Secondary | ICD-10-CM | POA: Diagnosis not present

## 2020-08-06 DIAGNOSIS — W010XXA Fall on same level from slipping, tripping and stumbling without subsequent striking against object, initial encounter: Secondary | ICD-10-CM | POA: Diagnosis not present

## 2020-08-06 DIAGNOSIS — I10 Essential (primary) hypertension: Secondary | ICD-10-CM | POA: Diagnosis not present

## 2020-08-09 DIAGNOSIS — M545 Low back pain, unspecified: Secondary | ICD-10-CM | POA: Diagnosis not present

## 2020-08-11 DIAGNOSIS — M545 Low back pain, unspecified: Secondary | ICD-10-CM | POA: Diagnosis not present

## 2020-08-13 DIAGNOSIS — M545 Low back pain, unspecified: Secondary | ICD-10-CM | POA: Diagnosis not present

## 2020-08-16 DIAGNOSIS — M545 Low back pain, unspecified: Secondary | ICD-10-CM | POA: Diagnosis not present

## 2020-08-18 DIAGNOSIS — M545 Low back pain, unspecified: Secondary | ICD-10-CM | POA: Diagnosis not present

## 2020-08-20 DIAGNOSIS — M545 Low back pain, unspecified: Secondary | ICD-10-CM | POA: Diagnosis not present

## 2020-09-21 DIAGNOSIS — M7061 Trochanteric bursitis, right hip: Secondary | ICD-10-CM | POA: Diagnosis not present

## 2020-09-21 DIAGNOSIS — K219 Gastro-esophageal reflux disease without esophagitis: Secondary | ICD-10-CM | POA: Diagnosis not present

## 2020-09-21 DIAGNOSIS — M5430 Sciatica, unspecified side: Secondary | ICD-10-CM | POA: Diagnosis not present

## 2020-09-28 ENCOUNTER — Ambulatory Visit (INDEPENDENT_AMBULATORY_CARE_PROVIDER_SITE_OTHER): Payer: Medicare PPO

## 2020-09-28 ENCOUNTER — Other Ambulatory Visit: Payer: Self-pay

## 2020-09-28 ENCOUNTER — Encounter: Payer: Self-pay | Admitting: Orthopaedic Surgery

## 2020-09-28 ENCOUNTER — Ambulatory Visit: Payer: Medicare PPO | Admitting: Orthopaedic Surgery

## 2020-09-28 DIAGNOSIS — M545 Low back pain, unspecified: Secondary | ICD-10-CM

## 2020-09-28 DIAGNOSIS — M1611 Unilateral primary osteoarthritis, right hip: Secondary | ICD-10-CM

## 2020-09-28 NOTE — Progress Notes (Deleted)
Office Visit Note   Patient: Adrian [name]           Date of Birth: 05-26-1950           MRN: [id number] Visit Date: 09/28/2020              Requested by: Ginger Organ., MD [address] [place],  [place] [address] PCP: Ginger Organ., MD   Assessment & Plan: Visit Diagnoses:  1. Pain in right hip   2. Low back pain, unspecified back pain laterality, unspecified chronicity, unspecified whether sciatica present     Plan: ***  Follow-Up Instructions: Return for post-op.   Orders:  Orders Placed This Encounter  Procedures   XR HIP UNILAT W OR W/O PELVIS 2-3 VIEWS RIGHT   XR Lumbar Spine 2-3 Views   No orders of the defined types were placed in this encounter.     Procedures: No procedures performed   Clinical Data: No additional findings.   Subjective: Chief Complaint  Patient presents with   Right Hip - Pain   Lower Back - Pain    HPI  Review of Systems   Objective: Vital Signs: There were no vitals taken for this visit.  Physical Exam  Ortho Exam  Specialty Comments:  No specialty comments available.  Imaging: No results found.   PMFS History: Patient Active Problem List   Diagnosis Date Noted   Vertigo    Coronary artery disease due to calcified coronary lesion 07/02/2018   Essential hypertension 07/02/2018   Pure hypercholesterolemia 07/02/2018   Past Medical History:  Diagnosis Date   Arthritis    Atypical nevus 04/20/2004   left low back (wider shave)   Basal cell carcinoma 01/16/1995   left back (cx3 2fu exc)   BCC (basal cell carcinoma of skin) 10/13/2002   left supra brow (cx3 fu exc)   BCC (basal cell carcinoma of skin) 04/20/2004   center upper back (cx3 47fu)   BCC (basal cell carcinoma of skin) 04/24/2006   upper mid back (cx3 5fu)   BCC (basal cell carcinoma of skin) 10/28/2012   left upper back (cx3 90fu)   BCC (basal cell carcinoma of skin) 10/28/2012   right back upper (cx3 53fu)   BCC (basal cell  carcinoma of skin) 10/28/2012   right deltoid (cx3 64fu)   BCC (basal cell carcinoma of skin) 10/25/2015   right chest no treatment basaliod neoplasm   DJD (degenerative joint disease)    Hydrocele    Hypertension    Right hydrocele 2021   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   left temple (cx3 25fu)   Seasonal allergies     History reviewed. No pertinent family history.  Past Surgical History:  Procedure Laterality Date   APPENDECTOMY     hemorrhaged post op   [place]   HERNIA REPAIR     umbilical   HYDROCELE EXCISION Right 07/14/2019   Procedure: HYDROCELECTOMY ADULT;  Surgeon: Kathie Rhodes, MD;  Location: [facility];  Service: Urology;  Laterality: Right;   KNEE ARTHROSCOPY  2010   lt   LUMBAR LAMINECTOMY  2008   SEPTOPLASTY     Social History   Occupational History   Not on file  Tobacco Use   Smoking status: Former    Pack years: 0.00    Types: Cigarettes    Start date: 04/03/1972    Quit date:  04/03/1973    Years since quitting: 47.5   Smokeless tobacco: Never  Substance and Sexual Activity   Alcohol use: Not Currently   Drug use: No   Sexual activity: Yes

## 2020-09-28 NOTE — Progress Notes (Signed)
Office Visit Note   Patient: Adrian [name]           Date of Birth: 1951-01-02           MRN: [id number] Visit Date: 09/28/2020              Requested by: Ginger Organ., MD [address] [place],  [place] [address] PCP: Ginger Organ., MD   Assessment & Plan: Visit Diagnoses:  1. Primary osteoarthritis of right hip   2. Low back pain, unspecified back pain laterality, unspecified chronicity, unspecified whether sciatica present     Plan: Impression is advanced degenerative joint disease right hip with underlying lumbar degenerative disc disease.  Has had PT over 3 months for hip pain without relief.  He feels as though he is able to manage his back pain at this time, but his hip pain is getting the best of him.  He cannot perform any activities of daily living without pain.  He is not interested in cortisone injections as this is only temporary.  We have discussed surgical intervention to include total hip arthroplasty for which he would like to proceed.  Risk, benefits and poss medications reviewed.  Rehab recovery time discussed.  All questions were answered.  He has concerns about an indwelling Foley therefore our plan is to for an in and out catheterization after the surgery  Total face to face encounter time was greater than 45 minutes and over half of this time was spent in counseling and/or coordination of care.  Follow-Up Instructions: Return for post-op.   Orders:  Orders Placed This Encounter  Procedures   XR HIP UNILAT W OR W/O PELVIS 2-3 VIEWS RIGHT   XR Lumbar Spine 2-3 Views   No orders of the defined types were placed in this encounter.     Procedures: No procedures performed   Clinical Data: No additional findings.   Subjective: Chief Complaint  Patient presents with   Right Hip - Pain   Lower Back - Pain    HPI patient is a pleasant 70 year old gentleman who comes in today with right hip pain.  This is been ongoing for a while but has  recently worsened.  The pain is primarily to the groin and radiates into the lateral hip.  Pain is worse with ambulation and any activity in addition to when he has to lift any more than 10 pounds.  He is starting to get slight weakness to the right lower extremity.  He has been using ice, ibuprofen and Tylenol without significant relief.  He does note numbness and tingling into his foot but has underlying lumbar pathology.  He has been in physical therapy for his back which is helped that.  Review of Systems as detailed in HPI.  All other reviewed and are negative.   Objective: Vital Signs: There were no vitals taken for this visit.  Physical Exam well-developed well-nourished gentleman no acute distress.  Alert and oriented x3.  Ortho Exam right hip exam shows markedly positive logroll.  Mildly positive straight leg raise.  Mild tenderness to the greater trochanter.  He is neurovascular intact distally.  Specialty Comments:  No specialty comments available.  Imaging: XR HIP UNILAT W OR W/O PELVIS 2-3 VIEWS RIGHT  Result Date: 09/28/2020 Advanced degenerative changes to the right hip joint  XR Lumbar Spine 2-3 Views  Result Date: 09/28/2020 Advanced degenerative changes to the lumbar spine worse at L3-4, L4-5 and L5-S1  PMFS History: Patient Active Problem List   Diagnosis Date Noted   Vertigo    Coronary artery disease due to calcified coronary lesion 07/02/2018   Essential hypertension 07/02/2018   Pure hypercholesterolemia 07/02/2018   Past Medical History:  Diagnosis Date   Arthritis    Atypical nevus 04/20/2004   left low back (wider shave)   Basal cell carcinoma 01/16/1995   left back (cx3 53fu exc)   BCC (basal cell carcinoma of skin) 10/13/2002   left supra brow (cx3 fu exc)   BCC (basal cell carcinoma of skin) 04/20/2004   center upper back (cx3 33fu)   BCC (basal cell carcinoma of skin) 04/24/2006   upper mid back (cx3 17fu)   BCC (basal cell carcinoma of skin)  10/28/2012   left upper back (cx3 82fu)   BCC (basal cell carcinoma of skin) 10/28/2012   right back upper (cx3 66fu)   BCC (basal cell carcinoma of skin) 10/28/2012   right deltoid (cx3 69fu)   BCC (basal cell carcinoma of skin) 10/25/2015   right chest no treatment basaliod neoplasm   DJD (degenerative joint disease)    Hydrocele    Hypertension    Right hydrocele 2021   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 No pertinent family history.  Past Surgical History:  Procedure Laterality Date   APPENDECTOMY     hemorrhaged post op   [place]   HERNIA REPAIR     umbilical   HYDROCELE EXCISION Right 07/14/2019   Procedure: HYDROCELECTOMY ADULT;  Surgeon: Kathie Rhodes, MD;  Location: [facility];  Service: Urology;  Laterality: Right;   KNEE ARTHROSCOPY  2010   lt   LUMBAR LAMINECTOMY  2008   SEPTOPLASTY     Social History   Occupational History   Not on file  Tobacco Use   Smoking status: Former    Pack years: 0.00    Types: Cigarettes    Start date: 04/03/1972    Quit date: 04/03/1973    Years since quitting: 47.5   Smokeless tobacco: Never  Substance and Sexual Activity   Alcohol use: Not Currently   Drug use: No   Sexual activity: Yes

## 2020-10-11 ENCOUNTER — Telehealth: Payer: Self-pay | Admitting: Orthopaedic Surgery

## 2020-10-11 NOTE — Telephone Encounter (Signed)
Pt called stating he had the option of doing a surgery on his hip but he would like to do an MRI before he goes ahead with surgery. Pt asked for a CB from [facility] [place] to discuss further.   [phone]

## 2020-10-12 NOTE — Telephone Encounter (Signed)
You don't want an mri, correct?

## 2020-10-12 NOTE — Telephone Encounter (Signed)
If it's MRI of the hip then no.  Thanks.

## 2020-10-13 NOTE — Telephone Encounter (Signed)
Agreed.  Just called and left vm

## 2020-10-22 ENCOUNTER — Other Ambulatory Visit: Payer: Self-pay

## 2020-11-02 ENCOUNTER — Other Ambulatory Visit: Payer: Self-pay | Admitting: Internal Medicine

## 2020-11-02 DIAGNOSIS — M5416 Radiculopathy, lumbar region: Secondary | ICD-10-CM

## 2020-11-03 ENCOUNTER — Telehealth: Payer: Self-pay | Admitting: Physician Assistant

## 2020-11-03 NOTE — Telephone Encounter (Signed)
Sent in

## 2020-11-03 NOTE — Telephone Encounter (Signed)
Pt calling asking for Mendel Ryder to give him a call back. He did not specify the question he had and would not give me any information to put in the note. The best call back number is [phone].

## 2020-11-04 NOTE — Progress Notes (Signed)
Surgical Instructions    Your procedure is scheduled on Monday, August 15th, 2022 .  Report to [facility] Main Entrance "A" at 10:50 A.M., then check in with the Admitting office.  Call this number if you have problems the morning of surgery:  [phone]   If you have any questions prior to your surgery date call [phone]: Open Monday-Friday 8am-4pm    Remember:  Do not eat after midnight the night before your surgery  You may drink clear liquids until 10:50 the morning of your surgery.   Clear liquids allowed are: Water, Non-Citrus Juices (without pulp), Carbonated Beverages, Clear Tea, Black Coffee Only, and Gatorade  Patient Instructions  The night before surgery:  No food after midnight. ONLY clear liquids after midnight  The day of surgery (if you do NOT have diabetes):  Drink ONE (1) Pre-Surgery Clear Ensure by 10:50 the morning of surgery. Drink in one sitting. Do not sip.  This drink was given to you during your hospital  pre-op appointment visit.  Nothing else to drink after completing the  Pre-Surgery Clear Ensure.          If you have questions, please contact your surgeon's office.     Take these medicines the morning of surgery with A SIP OF WATER:  acetaminophen (TYLENOL) - if needed rosuvastatin (CRESTOR)  fluticasone (FLONASE)  Follow your surgeon's instructions on when to stop Aspirin.  If no instructions were given by your surgeon then you will need to call the office to get those instructions.     As of today, STOP taking any Aspirin (unless otherwise instructed by your surgeon) Aleve, Naproxen, Ibuprofen, Motrin, Advil, Goody's, BC's, all herbal medications, fish oil, and all vitamins.          Do not wear jewelry  Do not wear lotions, powders, colognes, or deodorant. Men may shave face and neck. Do not bring valuables to the hospital. DO Not wear nail polish, gel polish, artificial nails, or any other type of covering on natural nails  including finger and toenails. If patients have artificial nails, gel coating, etc. that need to be removed by a nail salon please have this removed prior to surgery or surgery may need to be canceled/delayed if the surgeon/ anesthesia feels like the patient is unable to be adequately monitored.             [facility] is not responsible for any belongings or valuables.  Do NOT Smoke (Tobacco/Vaping) or drink Alcohol 24 hours prior to your procedure If you use a CPAP at night, you may bring all equipment for your overnight stay.   Contacts, glasses, dentures or bridgework may not be worn into surgery, please bring cases for these belongings   For patients admitted to the hospital, discharge time will be determined by your treatment team.   Patients discharged the day of surgery will not be allowed to drive home, and someone needs to stay with them for 24 hours.  ONLY 1 SUPPORT PERSON MAY BE PRESENT WHILE YOU ARE IN SURGERY. IF YOU ARE TO BE ADMITTED ONCE YOU ARE IN YOUR ROOM YOU WILL BE ALLOWED TWO (2) VISITORS.  Minor children may have two parents present. Special consideration for safety and communication needs will be reviewed on a case by case basis.  Special instructions:    Oral Hygiene is also important to reduce your risk of infection.  Remember - BRUSH YOUR TEETH THE MORNING OF SURGERY WITH YOUR REGULAR TOOTHPASTE  Adrian [name]- Preparing For Surgery  Before surgery, you can play an important role. Because skin is not sterile, your skin needs to be as free of germs as possible. You can reduce the number of germs on your skin by washing with CHG (chlorahexidine gluconate) Soap before surgery.  CHG is an antiseptic cleaner which kills germs and bonds with the skin to continue killing germs even after washing.     Please do not use if you have an allergy to CHG or antibacterial soaps. If your skin becomes reddened/irritated stop using the CHG.  Do not shave (including legs and  underarms) for at least 48 hours prior to first CHG shower. It is OK to shave your face.  Please follow these instructions carefully.     Shower the NIGHT BEFORE SURGERY and the MORNING OF SURGERY with CHG Soap.   If you chose to wash your hair, wash your hair first as usual with your normal shampoo. After you shampoo, rinse your hair and body thoroughly to remove the shampoo.  Then [employer] and genitals (private parts) with your normal soap and rinse thoroughly to remove soap.  After that Use CHG Soap as you would any other liquid soap. You can apply CHG directly to the skin and wash gently with a scrungie or a clean washcloth.   Apply the CHG Soap to your body ONLY FROM THE NECK DOWN.  Do not use on open wounds or open sores. Avoid contact with your eyes, ears, mouth and genitals (private parts). Wash Face and genitals (private parts)  with your normal soap.   Wash thoroughly, paying special attention to the area where your surgery will be performed.  Thoroughly rinse your body with warm water from the neck down.  DO NOT shower/wash with your normal soap after using and rinsing off the CHG Soap.  Pat yourself dry with a CLEAN TOWEL.  Wear CLEAN PAJAMAS to bed the night before surgery  Place CLEAN SHEETS on your bed the night before your surgery  DO NOT SLEEP WITH PETS.   Day of Surgery:  Take a shower with CHG soap. Wear Clean/Comfortable clothing the morning of surgery Do not apply any deodorants/lotions.   Remember to brush your teeth WITH YOUR REGULAR TOOTHPASTE.   Please read over the following fact sheets that you were given.

## 2020-11-05 ENCOUNTER — Encounter (HOSPITAL_COMMUNITY): Payer: Self-pay

## 2020-11-05 ENCOUNTER — Encounter (HOSPITAL_COMMUNITY)
Admission: RE | Admit: 2020-11-05 | Discharge: 2020-11-05 | Disposition: A | Discharge status: Home / Self Care | Payer: Medicare PPO | Source: Ambulatory Visit | Attending: Physician Assistant | Admitting: Physician Assistant

## 2020-11-05 ENCOUNTER — Other Ambulatory Visit: Payer: Self-pay

## 2020-11-05 ENCOUNTER — Encounter (HOSPITAL_COMMUNITY)
Admission: RE | Admit: 2020-11-05 | Discharge: 2020-11-05 | Disposition: A | Discharge status: Home / Self Care | Payer: Medicare PPO | Source: Ambulatory Visit | Attending: Orthopaedic Surgery | Admitting: Orthopaedic Surgery

## 2020-11-05 DIAGNOSIS — Z79899 Other long term (current) drug therapy: Secondary | ICD-10-CM | POA: Diagnosis not present

## 2020-11-05 DIAGNOSIS — Z8616 Personal history of COVID-19: Secondary | ICD-10-CM | POA: Insufficient documentation

## 2020-11-05 DIAGNOSIS — I10 Essential (primary) hypertension: Secondary | ICD-10-CM | POA: Insufficient documentation

## 2020-11-05 DIAGNOSIS — M1611 Unilateral primary osteoarthritis, right hip: Secondary | ICD-10-CM | POA: Insufficient documentation

## 2020-11-05 DIAGNOSIS — Z7982 Long term (current) use of aspirin: Secondary | ICD-10-CM | POA: Insufficient documentation

## 2020-11-05 DIAGNOSIS — Z87891 Personal history of nicotine dependence: Secondary | ICD-10-CM | POA: Insufficient documentation

## 2020-11-05 DIAGNOSIS — Z01818 Encounter for other preprocedural examination: Secondary | ICD-10-CM | POA: Insufficient documentation

## 2020-11-05 HISTORY — DX: Gastro-esophageal reflux disease without esophagitis: K21.9

## 2020-11-05 LAB — CBC WITH DIFFERENTIAL/PLATELET
Abs Immature Granulocytes: 0.03 10*3/uL (ref 0.00–0.07)
Basophils Absolute: 0.1 10*3/uL (ref 0.0–0.1)
Basophils Relative: 1 %
Eosinophils Absolute: 0.4 10*3/uL (ref 0.0–0.5)
Eosinophils Relative: 5 %
HCT: 47.5 % (ref 39.0–52.0)
Hemoglobin: 15.7 g/dL (ref 13.0–17.0)
Immature Granulocytes: 0 %
Lymphocytes Relative: 23 %
Lymphs Abs: 1.8 10*3/uL (ref 0.7–4.0)
MCH: 28.2 pg (ref 26.0–34.0)
MCHC: 33.1 g/dL (ref 30.0–36.0)
MCV: 85.3 fL (ref 80.0–100.0)
Monocytes Absolute: 0.7 10*3/uL (ref 0.1–1.0)
Monocytes Relative: 10 %
Neutro Abs: 4.5 10*3/uL (ref 1.7–7.7)
Neutrophils Relative %: 61 %
Platelets: 262 10*3/uL (ref 150–400)
RBC: 5.57 MIL/uL (ref 4.22–5.81)
RDW: 13.5 % (ref 11.5–15.5)
WBC: 7.5 10*3/uL (ref 4.0–10.5)
nRBC: 0 % (ref 0.0–0.2)

## 2020-11-05 LAB — COMPREHENSIVE METABOLIC PANEL
ALT: 23 U/L (ref 0–44)
AST: 21 U/L (ref 15–41)
Albumin: 4.3 g/dL (ref 3.5–5.0)
Alkaline Phosphatase: 62 U/L (ref 38–126)
Anion gap: 7 (ref 5–15)
BUN: 17 mg/dL (ref 8–23)
CO2: 29 mmol/L (ref 22–32)
Calcium: 9.7 mg/dL (ref 8.9–10.3)
Chloride: 103 mmol/L (ref 98–111)
Creatinine, Ser: 0.81 mg/dL (ref 0.61–1.24)
GFR, Estimated: >60 mL/min (ref >60)
Glucose, Bld: 111 mg/dL — ABNORMAL HIGH (ref 70–99)
Potassium: 4 mmol/L (ref 3.5–5.1)
Sodium: 139 mmol/L (ref 135–145)
Total Bilirubin: 1.9 mg/dL — ABNORMAL HIGH (ref 0.3–1.2)
Total Protein: 7.3 g/dL (ref 6.5–8.1)

## 2020-11-05 LAB — TYPE AND SCREEN
ABO/RH(D): O POS
Antibody Screen: NEGATIVE

## 2020-11-05 LAB — URINALYSIS, ROUTINE W REFLEX MICROSCOPIC
Bilirubin Urine: NEGATIVE
Glucose, UA: NEGATIVE mg/dL
Hgb urine dipstick: NEGATIVE
Ketones, ur: NEGATIVE mg/dL
Leukocytes,Ua: NEGATIVE
Nitrite: NEGATIVE
Protein, ur: NEGATIVE mg/dL
Specific Gravity, Urine: 1.01 (ref 1.005–1.030)
pH: 7 (ref 5.0–8.0)

## 2020-11-05 LAB — SURGICAL PCR SCREEN
MRSA, PCR: NEGATIVE
Staphylococcus aureus: NEGATIVE

## 2020-11-05 LAB — PROTIME-INR
INR: 1 (ref 0.8–1.2)
Prothrombin Time: 12.8 seconds (ref 11.4–15.2)

## 2020-11-05 LAB — APTT: aPTT: 29 seconds (ref 24–36)

## 2020-11-05 NOTE — Progress Notes (Signed)
Just looking at this note and evidently he was told to continue asa until surgery per you.  Just wanted to confirm

## 2020-11-05 NOTE — Progress Notes (Signed)
PCP - Dr. Marton Redwood Cardiologist - Dr. Ivin Poot. Has a calcium score of 2000.   Chest x-ray - 11/05/20 EKG - 11/05/20 Stress Test - 01/21/19 ECHO - Denies Cardiac Cath - Denies  Sleep Study - Denies  DM - Denies  Aspirin Instructions: Has received instructions from Dr. Erlinda Hong may continue through day of surgery.   ERAS Protcol -Yes PRE-SURGERY Ensure given COVID TEST- Received address and phone number handout. Will go next week on 8/11 or 8/12   Anesthesia review: Yes cardiac history   Patient denies shortness of breath, fever, cough and chest pain at PAT appointment   All instructions explained to the patient, with a verbal understanding of the material. Patient agrees to go over the instructions while at home for a better understanding. Patient also instructed to wear a mask while in public after being tested for COVID-19. The opportunity to ask questions was provided.

## 2020-11-06 ENCOUNTER — Ambulatory Visit
Admission: RE | Admit: 2020-11-06 | Discharge: 2020-11-06 | Disposition: A | Discharge status: Home / Self Care | Payer: Medicare PPO | Source: Ambulatory Visit | Attending: Internal Medicine | Admitting: Internal Medicine

## 2020-11-06 DIAGNOSIS — M48061 Spinal stenosis, lumbar region without neurogenic claudication: Secondary | ICD-10-CM | POA: Diagnosis not present

## 2020-11-06 DIAGNOSIS — M5416 Radiculopathy, lumbar region: Secondary | ICD-10-CM

## 2020-11-06 DIAGNOSIS — G8929 Other chronic pain: Secondary | ICD-10-CM | POA: Diagnosis not present

## 2020-11-06 DIAGNOSIS — R200 Anesthesia of skin: Secondary | ICD-10-CM | POA: Diagnosis not present

## 2020-11-06 DIAGNOSIS — M4807 Spinal stenosis, lumbosacral region: Secondary | ICD-10-CM | POA: Diagnosis not present

## 2020-11-06 MED ORDER — GADOBENATE DIMEGLUMINE 529 MG/ML IV SOLN
15.0000 mL | Freq: Once | INTRAVENOUS | Status: AC | PRN
  Administered 2020-11-06: 15 mL via INTRAVENOUS

## 2020-11-08 ENCOUNTER — Other Ambulatory Visit: Payer: Self-pay | Admitting: Physician Assistant

## 2020-11-08 MED ORDER — ASPIRIN EC 81 MG PO TBEC: 81.0000 mg | DELAYED_RELEASE_TABLET | Freq: Two times a day (BID) | ORAL | 0 refills | Status: AC

## 2020-11-08 MED ORDER — DOCUSATE SODIUM 100 MG PO CAPS: 100.0000 mg | ORAL_CAPSULE | Freq: Every day | ORAL | 2 refills | Status: DC | PRN

## 2020-11-08 MED ORDER — METHOCARBAMOL 500 MG PO TABS: 500.0000 mg | ORAL_TABLET | Freq: Two times a day (BID) | ORAL | 0 refills | Status: DC | PRN

## 2020-11-08 MED ORDER — OXYCODONE-ACETAMINOPHEN 5-325 MG PO TABS: 1.0000 | ORAL_TABLET | Freq: Four times a day (QID) | ORAL | 0 refills | Status: DC | PRN

## 2020-11-08 MED ORDER — ONDANSETRON HCL 4 MG PO TABS: 4.0000 mg | ORAL_TABLET | Freq: Three times a day (TID) | ORAL | 0 refills | Status: DC | PRN

## 2020-11-08 NOTE — Anesthesia Preprocedure Evaluation (Addendum)
Anesthesia Evaluation  Patient identified by MRN, date of birth, ID band Patient awake    Reviewed: Allergy & Precautions, NPO status , Patient's Chart, lab work & pertinent test results  Airway Mallampati: II  TM Distance: >3 FB Neck ROM: Full    Dental no notable dental hx.    Pulmonary neg pulmonary ROS, former smoker,    Pulmonary exam normal breath sounds clear to auscultation       Cardiovascular hypertension, Pt. on medications + CAD  Normal cardiovascular exam Rhythm:Regular Rate:Normal     Neuro/Psych negative neurological ROS  negative psych ROS   GI/Hepatic negative GI ROS, Neg liver ROS,   Endo/Other  negative endocrine ROS  Renal/GU negative Renal ROS  negative genitourinary   Musculoskeletal  (+) Arthritis , Osteoarthritis,    Abdominal   Peds negative pediatric ROS (+)  Hematology negative hematology ROS (+)   Anesthesia Other Findings   Reproductive/Obstetrics negative OB ROS                            Anesthesia Physical Anesthesia Plan  ASA: 3  Anesthesia Plan: Spinal   Post-op Pain Management:    Induction: Intravenous  PONV Risk Score and Plan: 2 and Ondansetron, Midazolam and Treatment may vary due to age or medical condition  Airway Management Planned: Simple Face Mask  Additional Equipment:   Intra-op Plan:   Post-operative Plan:   Informed Consent: I have reviewed the patients History and Physical, chart, labs and discussed the procedure including the risks, benefits and alternatives for the proposed anesthesia with the patient or authorized representative who has indicated his/her understanding and acceptance.     Dental advisory given  Plan Discussed with: CRNA  Anesthesia Plan Comments: (PAT note written 11/08/2020 by Myra Gianotti, PA-C. )      Anesthesia Quick Evaluation

## 2020-11-08 NOTE — Progress Notes (Signed)
Anesthesia Chart Review:  Case: [id number] Date/Time: 11/15/20 1240   Procedure: RIGHT TOTAL HIP ARTHROPLASTY ANTERIOR APPROACH (Right: Hip) - 3-C   Anesthesia type: Spinal   Pre-op diagnosis: right hip degenerative joint disease   Location: [facility] OR ROOM 04 / [place] OR   Surgeons: Leandrew Koyanagi, MD       DISCUSSION: Patient is a 70 year old male scheduled for the above procedure.  History includes former smoker (quit 04/03/73), HTN, skin cancer (BCC, SCC), right hydrocele (s/p right hydrocelectomy 07/14/19), GERD, spinal surgery (c-spine surgery 1995; right L3-4 laminectomy 12/25/06), COVID-19 (11/2019).   He is followed by Dr. Harrell Gave since 2020 after having an elevated coronary calcium score  (95%) with calcifications in the LAD, LCx, RCA.Marland Kitchen  Risk factor control recommended.  On aspirin and statin.  01/2019 ETT was non-ischemic, METS > 10.  Lat visit 02/03/20 with yearly follow-up planned.  He reported that Dr. Erlinda Hong said he could continue aspirin perioperatively. Fannie Knee, PA-C is going to confirm with surgeon. Dr. Erlinda Hong also wrote that patient had concerns about an indwelling Foley, and therefore he was planning to do I&O cath after surgery. Patient is scheduled for preoperative COVID-19 testing on 8/11 or 11/12/2020.  Anesthesia team to evaluate on the day of surgery.   VS: BP (!) 142/73   Pulse 85   Temp 36.7 C (Oral)   Resp 17   Ht 5' 10.5" (1.791 m)   Wt 88.8 kg   SpO2 99%   BMI 27.70 kg/m    PROVIDERS: Ginger Organ., MD is PCP  Buford Dresser, MD is cardiologist   LABS: Labs reviewed: Acceptable for surgery. Total bili 1.9 (previously 1.2 12/19/19) with normal AST/ALT, CBC.  (all labs ordered are listed, but only abnormal results are displayed)  Labs Reviewed  COMPREHENSIVE METABOLIC PANEL - Abnormal; Notable for the following components:      Result Value   Glucose, Bld 111 (*)    Total Bilirubin 1.9 (*)    All other components within normal limits   SURGICAL PCR SCREEN  CBC WITH DIFFERENTIAL/PLATELET  PROTIME-INR  APTT  URINALYSIS, ROUTINE W REFLEX MICROSCOPIC  TYPE AND SCREEN     IMAGES: CXR 11/05/20: FINDINGS: The heart size and mediastinal contours are within normal limits. Both lungs are clear. The visualized skeletal structures are unremarkable. IMPRESSION: No active cardiopulmonary disease.   MRI L-spine 11/06/20: IMPRESSION: 1. Severe widespread lumbar disc degeneration, progressed from 2008. 2. Severe spinal stenosis at L3-4, L4-5, and L5-S1. 3. Advanced neural foraminal stenosis bilaterally at L3-4 and L5-S1 and on the right at L4-5.   CT head/CTA head/neck 12/19/19 (for vertigo, right facial tingling w/u): IMPRESSION: 1. Negative for large vessel occlusion. 2. Minimal atherosclerosis for age, with no arterial stenosis identified. 3. Widespread cervical spine degeneration.   EKG: 11/05/20: Normal sinus rhythm Possible Left atrial enlargement Incomplete right bundle branch block Borderline ECG No significant change since last tracing Confirmed by Boyce, Will [phone]) on 11/05/2020 4:03:38 PM   CV: ETT 01/21/19: Impression: 1. Negative ETT for ischemia. 2. Excellent functional capacity (9:00 minutes, >10 METS). 3. Low-risk study.   CT Cardiac Scoring 06/04/18: - CORONARY CALCIUM Total Agatston Score: 2190 with calcifications most notable in the left anterior descending coronary artery and right coronary artery, also noted in the left circumflex coronary artery. MESA database percentile:  95 - OTHER FINDINGS: Cardiovascular: Heart is normal size.  Aorta is normal caliber. Mediastinum/Nodes: No adenopathy in the lower mediastinum or hila. Lungs/Pleura: Visualized lungs  clear. Upper Abdomen: Imaging into the upper abdomen shows no acute findings. Musculoskeletal: Chest wall soft tissues are unremarkable. No acute bony abnormality. - IMPRESSION: The observed calcium score of 2190 is at the percentile  95 for subjects of the same age, gender and race/ethnicity who are free of clinical cardiovascular disease and treated diabetes. No acute or significant extracardiac abnormality    Past Medical History:  Diagnosis Date   Arthritis    Atypical nevus 04/20/2004   left low back (wider shave)   Basal cell carcinoma 01/16/1995   left back (cx3 74f exc)   BCC (basal cell carcinoma of skin) 10/13/2002   left supra brow (cx3 fu exc)   BCC (basal cell carcinoma of skin) 04/20/2004   center upper back (cx3 549f   BCC (basal cell carcinoma of skin) 04/24/2006   upper mid back (cx3 62f80f  BCC (basal cell carcinoma of skin) 10/28/2012   left upper back (cx3 62fu57f BCC (basal cell carcinoma of skin) 10/28/2012   right back upper (cx3 62fu)12fBCC (basal cell carcinoma of skin) 10/28/2012   right deltoid (cx3 62fu) 41fCC (basal cell carcinoma of skin) 10/25/2015   right chest no treatment basaliod neoplasm   DJD (degenerative joint disease)    GERD (gastroesophageal reflux disease)    Hydrocele    Hypertension    Right hydrocele 2021   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   left temple (cx3 62fu)  162fasonal allergies     Past Surgical History:  Procedure Laterality Date   APPENDECTOMY     hemorrhaged post op   BACK [place]  04/03/1993   DIAGNOSTIC LAPAROSCOPY     HERNIA REPAIR     umbilical   HYDROCELE EXCISION Right 07/14/2019   Procedure: HYDROCELECTOMY ADULT;  Surgeon: OttelinKathie Rhodes[facility] [place];  Laterality: Right;   KNEE ARTHROSCOPY  04/03/2008   lt   LUMBAR LAMINECTOMY  04/03/2006   SEPTOPLASTY     TONSILLECTOMY Bilateral 1970    MEDICATIONS:  acetaminophen (TYLENOL) 500 MG tablet   aspirin EC 81 MG tablet   Cholecalciferol (VITAMIN D) 50 MCG (2000 UT) CAPS   Coenzyme Q10 (COQ10 PO)   docusate sodium (COLACE) 100 MG capsule   fluticasone (FLONASE) 50 MCG/ACT nasal spray   ibuprofen  (ADVIL) 200 MG tablet   methocarbamol (ROBAXIN) 500 MG tablet   olmesartan (BENICAR) 20 MG tablet   ondansetron (ZOFRAN) 4 MG tablet   oxyCODONE-acetaminophen (PERCOCET) 5-325 MG tablet   rosuvastatin (CRESTOR) 20 MG tablet   No current facility-administered medications for this encounter.    AllisonMyra GianottiSurgical Short Stay/Anesthesiology [phone] 2:40 PM

## 2020-11-12 ENCOUNTER — Other Ambulatory Visit: Payer: Self-pay | Admitting: Orthopaedic Surgery

## 2020-11-14 ENCOUNTER — Telehealth: Payer: Self-pay | Admitting: *Deleted

## 2020-11-14 NOTE — Telephone Encounter (Signed)
Ortho bundle pre-op call completed. 

## 2020-11-14 NOTE — Care Plan (Signed)
[facility] RNCM call to patient to discuss his upcoming Right total hip arthroplasty with Dr. Erlinda Hong. He is an Ortho bundle patient through [facility] and is agreeable to case management. He lives with his wife, who will be assisting him at home after discharge. He lives in a 2 story home and his bedroom is upstairs. There are 14 steps to the 2nd level and 3 steps to get into his home he states. He will need a FWW and 3in1/BSC prior to discharge. These will be ordered and provided at hospital. Anticipate HHPT will be needed. Choice provided and referral made to [facility]. Reviewed all post op care instructions. Will continue to follow for needs.

## 2020-11-15 ENCOUNTER — Observation Stay (HOSPITAL_COMMUNITY)
Admission: RE | Admit: 2020-11-15 | Discharge: 2020-11-16 | Disposition: A | Discharge status: Home / Self Care | Payer: Medicare PPO | Attending: Orthopaedic Surgery | Admitting: Orthopaedic Surgery

## 2020-11-15 ENCOUNTER — Other Ambulatory Visit: Payer: Self-pay

## 2020-11-15 ENCOUNTER — Ambulatory Visit (HOSPITAL_COMMUNITY): Payer: Medicare PPO

## 2020-11-15 ENCOUNTER — Observation Stay (HOSPITAL_COMMUNITY): Payer: Medicare PPO

## 2020-11-15 ENCOUNTER — Encounter (HOSPITAL_COMMUNITY)
Admission: RE | Disposition: A | Discharge status: Home / Self Care | Payer: Self-pay | Source: Home / Self Care | Attending: Orthopaedic Surgery

## 2020-11-15 ENCOUNTER — Encounter (HOSPITAL_COMMUNITY): Payer: Self-pay | Admitting: Orthopaedic Surgery

## 2020-11-15 ENCOUNTER — Ambulatory Visit (HOSPITAL_COMMUNITY): Payer: Medicare PPO | Admitting: Certified Registered"

## 2020-11-15 ENCOUNTER — Ambulatory Visit (HOSPITAL_COMMUNITY): Payer: Medicare PPO | Admitting: Vascular Surgery

## 2020-11-15 DIAGNOSIS — M1611 Unilateral primary osteoarthritis, right hip: Principal | ICD-10-CM

## 2020-11-15 DIAGNOSIS — Z7982 Long term (current) use of aspirin: Secondary | ICD-10-CM | POA: Diagnosis not present

## 2020-11-15 DIAGNOSIS — Z96641 Presence of right artificial hip joint: Secondary | ICD-10-CM | POA: Diagnosis not present

## 2020-11-15 DIAGNOSIS — Z20822 Contact with and (suspected) exposure to covid-19: Secondary | ICD-10-CM | POA: Diagnosis not present

## 2020-11-15 DIAGNOSIS — E7800 Pure hypercholesterolemia, unspecified: Secondary | ICD-10-CM | POA: Diagnosis not present

## 2020-11-15 DIAGNOSIS — Z85828 Personal history of other malignant neoplasm of skin: Secondary | ICD-10-CM | POA: Insufficient documentation

## 2020-11-15 DIAGNOSIS — Z87891 Personal history of nicotine dependence: Secondary | ICD-10-CM | POA: Insufficient documentation

## 2020-11-15 DIAGNOSIS — Z79899 Other long term (current) drug therapy: Secondary | ICD-10-CM | POA: Insufficient documentation

## 2020-11-15 DIAGNOSIS — Z471 Aftercare following joint replacement surgery: Secondary | ICD-10-CM | POA: Diagnosis not present

## 2020-11-15 DIAGNOSIS — I10 Essential (primary) hypertension: Secondary | ICD-10-CM | POA: Insufficient documentation

## 2020-11-15 DIAGNOSIS — K219 Gastro-esophageal reflux disease without esophagitis: Secondary | ICD-10-CM | POA: Diagnosis not present

## 2020-11-15 DIAGNOSIS — Z419 Encounter for procedure for purposes other than remedying health state, unspecified: Secondary | ICD-10-CM

## 2020-11-15 DIAGNOSIS — Z96649 Presence of unspecified artificial hip joint: Secondary | ICD-10-CM

## 2020-11-15 DIAGNOSIS — J302 Other seasonal allergic rhinitis: Secondary | ICD-10-CM | POA: Diagnosis not present

## 2020-11-15 HISTORY — PX: TOTAL HIP ARTHROPLASTY: SHX124

## 2020-11-15 LAB — SARS CORONAVIRUS 2 BY RT PCR (HOSPITAL ORDER, PERFORMED IN ~~LOC~~ HOSPITAL LAB): SARS Coronavirus 2: NEGATIVE

## 2020-11-15 SURGERY — ARTHROPLASTY, HIP, TOTAL, ANTERIOR APPROACH
Anesthesia: General | Site: Hip | Laterality: Right

## 2020-11-15 MED ORDER — PROPOFOL 500 MG/50ML IV EMUL
INTRAVENOUS | Status: DC | PRN
  Administered 2020-11-15: 125 ug/kg/min via INTRAVENOUS

## 2020-11-15 MED ORDER — 0.9 % SODIUM CHLORIDE (POUR BTL) OPTIME
TOPICAL | Status: DC | PRN
  Administered 2020-11-15: 1000 mL

## 2020-11-15 MED ORDER — BUPIVACAINE-MELOXICAM ER 400-12 MG/14ML IJ SOLN
INTRAMUSCULAR | Status: AC
  Filled 2020-11-15: qty 1

## 2020-11-15 MED ORDER — MIDAZOLAM HCL 2 MG/2ML IJ SOLN
INTRAMUSCULAR | Status: AC
  Filled 2020-11-15: qty 2

## 2020-11-15 MED ORDER — ONDANSETRON HCL 4 MG/2ML IJ SOLN
INTRAMUSCULAR | Status: DC | PRN
  Administered 2020-11-15: 4 mg via INTRAVENOUS

## 2020-11-15 MED ORDER — ONDANSETRON HCL 4 MG PO TABS: 4.0000 mg | ORAL_TABLET | Freq: Four times a day (QID) | ORAL | Status: DC | PRN

## 2020-11-15 MED ORDER — POVIDONE-IODINE 10 % EX SWAB
2.0000 "application " | Freq: Once | CUTANEOUS | Status: AC
  Administered 2020-11-15: 2 via TOPICAL

## 2020-11-15 MED ORDER — BUPIVACAINE IN DEXTROSE 0.75-8.25 % IT SOLN
INTRATHECAL | Status: DC | PRN
  Administered 2020-11-15: 2 mL via INTRATHECAL

## 2020-11-15 MED ORDER — TRANEXAMIC ACID-NACL 1000-0.7 MG/100ML-% IV SOLN
1000.0000 mg | INTRAVENOUS | Status: AC
  Administered 2020-11-15: 1000 mg via INTRAVENOUS
  Filled 2020-11-15: qty 100

## 2020-11-15 MED ORDER — ASPIRIN 81 MG PO CHEW
81.0000 mg | CHEWABLE_TABLET | Freq: Two times a day (BID) | ORAL | Status: DC
  Administered 2020-11-15 – 2020-11-16 (×2): 81 mg via ORAL
  Filled 2020-11-15 (×2): qty 1

## 2020-11-15 MED ORDER — ALUM & MAG HYDROXIDE-SIMETH 200-200-20 MG/5ML PO SUSP: 30.0000 mL | ORAL | Status: DC | PRN

## 2020-11-15 MED ORDER — ONDANSETRON HCL 4 MG/2ML IJ SOLN: 4.0000 mg | Freq: Four times a day (QID) | INTRAMUSCULAR | Status: DC | PRN

## 2020-11-15 MED ORDER — BUPIVACAINE-MELOXICAM ER 400-12 MG/14ML IJ SOLN
INTRAMUSCULAR | Status: DC | PRN
  Administered 2020-11-15: 400 mg

## 2020-11-15 MED ORDER — LACTATED RINGERS IV SOLN: INTRAVENOUS | Status: DC

## 2020-11-15 MED ORDER — METHOCARBAMOL 500 MG PO TABS
500.0000 mg | ORAL_TABLET | Freq: Four times a day (QID) | ORAL | Status: DC | PRN
  Administered 2020-11-15 – 2020-11-16 (×3): 500 mg via ORAL
  Filled 2020-11-15 (×3): qty 1

## 2020-11-15 MED ORDER — ACETAMINOPHEN 500 MG PO TABS
1000.0000 mg | ORAL_TABLET | Freq: Four times a day (QID) | ORAL | Status: AC
  Administered 2020-11-15 – 2020-11-16 (×4): 1000 mg via ORAL
  Filled 2020-11-15 (×4): qty 2

## 2020-11-15 MED ORDER — SODIUM CHLORIDE 0.9 % IR SOLN
Status: DC | PRN
  Administered 2020-11-15: 1000 mL

## 2020-11-15 MED ORDER — METOCLOPRAMIDE HCL 5 MG/ML IJ SOLN: 5.0000 mg | Freq: Three times a day (TID) | INTRAMUSCULAR | Status: DC | PRN

## 2020-11-15 MED ORDER — DOCUSATE SODIUM 100 MG PO CAPS
100.0000 mg | ORAL_CAPSULE | Freq: Two times a day (BID) | ORAL | Status: DC
  Administered 2020-11-15 – 2020-11-16 (×2): 100 mg via ORAL
  Filled 2020-11-15 (×2): qty 1

## 2020-11-15 MED ORDER — PROMETHAZINE HCL 25 MG/ML IJ SOLN: 6.2500 mg | INTRAMUSCULAR | Status: DC | PRN

## 2020-11-15 MED ORDER — HYDROMORPHONE HCL 1 MG/ML IJ SOLN: 0.2500 mg | INTRAMUSCULAR | Status: DC | PRN

## 2020-11-15 MED ORDER — POLYETHYLENE GLYCOL 3350 17 G PO PACK
17.0000 g | PACK | Freq: Every day | ORAL | Status: DC
  Administered 2020-11-15 – 2020-11-16 (×2): 17 g via ORAL
  Filled 2020-11-15 (×2): qty 1

## 2020-11-15 MED ORDER — TRANEXAMIC ACID-NACL 1000-0.7 MG/100ML-% IV SOLN
1000.0000 mg | Freq: Once | INTRAVENOUS | Status: AC
  Administered 2020-11-15: 1000 mg via INTRAVENOUS
  Filled 2020-11-15: qty 100

## 2020-11-15 MED ORDER — TRANEXAMIC ACID 1000 MG/10ML IV SOLN
2000.0000 mg | INTRAVENOUS | Status: DC
  Filled 2020-11-15: qty 20

## 2020-11-15 MED ORDER — OXYCODONE HCL 5 MG/5ML PO SOLN: 5.0000 mg | Freq: Once | ORAL | Status: DC | PRN

## 2020-11-15 MED ORDER — IRRISEPT - 450ML BOTTLE WITH 0.05% CHG IN STERILE WATER, USP 99.95% OPTIME
TOPICAL | Status: DC | PRN
  Administered 2020-11-15: 450 mL via TOPICAL

## 2020-11-15 MED ORDER — PHENOL 1.4 % MT LIQD: 1.0000 | OROMUCOSAL | Status: DC | PRN

## 2020-11-15 MED ORDER — SODIUM CHLORIDE 0.9 % IV SOLN: INTRAVENOUS | Status: DC

## 2020-11-15 MED ORDER — METHOCARBAMOL 1000 MG/10ML IJ SOLN
500.0000 mg | Freq: Four times a day (QID) | INTRAVENOUS | Status: DC | PRN
  Filled 2020-11-15 (×2): qty 5

## 2020-11-15 MED ORDER — PANTOPRAZOLE SODIUM 40 MG PO TBEC
40.0000 mg | DELAYED_RELEASE_TABLET | Freq: Every day | ORAL | Status: DC
  Filled 2020-11-15: qty 1

## 2020-11-15 MED ORDER — ACETAMINOPHEN 325 MG PO TABS: 325.0000 mg | ORAL_TABLET | Freq: Four times a day (QID) | ORAL | Status: DC | PRN

## 2020-11-15 MED ORDER — HYDROMORPHONE HCL 1 MG/ML IJ SOLN
0.5000 mg | INTRAMUSCULAR | Status: DC | PRN
  Administered 2020-11-15 – 2020-11-16 (×2): 1 mg via INTRAVENOUS
  Filled 2020-11-15 (×2): qty 1

## 2020-11-15 MED ORDER — VANCOMYCIN HCL 1000 MG IV SOLR
INTRAVENOUS | Status: AC
  Filled 2020-11-15: qty 20

## 2020-11-15 MED ORDER — SORBITOL 70 % SOLN
30.0000 mL | Freq: Every day | Status: DC | PRN
  Filled 2020-11-15 (×2): qty 30

## 2020-11-15 MED ORDER — TRANEXAMIC ACID 1000 MG/10ML IV SOLN
INTRAVENOUS | Status: DC | PRN
  Administered 2020-11-15: 2000 mg via TOPICAL

## 2020-11-15 MED ORDER — MENTHOL 3 MG MT LOZG: 1.0000 | LOZENGE | OROMUCOSAL | Status: DC | PRN

## 2020-11-15 MED ORDER — DOUBLE ANTIBIOTIC 500-10000 UNIT/GM EX OINT
TOPICAL_OINTMENT | CUTANEOUS | Status: AC
  Filled 2020-11-15: qty 28.4

## 2020-11-15 MED ORDER — ORAL CARE MOUTH RINSE: 15.0000 mL | Freq: Once | OROMUCOSAL | Status: AC

## 2020-11-15 MED ORDER — METOCLOPRAMIDE HCL 5 MG PO TABS
5.0000 mg | ORAL_TABLET | Freq: Three times a day (TID) | ORAL | Status: DC | PRN
Start: 2020-11-15 — End: 2020-11-16

## 2020-11-15 MED ORDER — OXYCODONE HCL 5 MG PO TABS
10.0000 mg | ORAL_TABLET | ORAL | Status: DC | PRN
  Administered 2020-11-15: 10 mg via ORAL
  Filled 2020-11-15 (×2): qty 2

## 2020-11-15 MED ORDER — VANCOMYCIN HCL 1 G IV SOLR
INTRAVENOUS | Status: DC | PRN
  Administered 2020-11-15: 1000 mg via TOPICAL

## 2020-11-15 MED ORDER — DIPHENHYDRAMINE HCL 12.5 MG/5ML PO ELIX
25.0000 mg | ORAL_SOLUTION | ORAL | Status: DC | PRN
  Filled 2020-11-15: qty 10

## 2020-11-15 MED ORDER — OXYCODONE HCL 5 MG PO TABS
5.0000 mg | ORAL_TABLET | ORAL | Status: DC | PRN
  Administered 2020-11-15 – 2020-11-16 (×3): 10 mg via ORAL
  Filled 2020-11-15 (×3): qty 2

## 2020-11-15 MED ORDER — FENTANYL CITRATE (PF) 100 MCG/2ML IJ SOLN
INTRAMUSCULAR | Status: AC
  Filled 2020-11-15: qty 2

## 2020-11-15 MED ORDER — CEFAZOLIN SODIUM-DEXTROSE 2-4 GM/100ML-% IV SOLN
2.0000 g | INTRAVENOUS | Status: AC
  Administered 2020-11-15: 2 g via INTRAVENOUS
  Filled 2020-11-15: qty 100

## 2020-11-15 MED ORDER — DEXAMETHASONE SODIUM PHOSPHATE 10 MG/ML IJ SOLN
10.0000 mg | Freq: Once | INTRAMUSCULAR | Status: AC
  Administered 2020-11-16: 10 mg via INTRAVENOUS
  Filled 2020-11-15: qty 1

## 2020-11-15 MED ORDER — OXYCODONE HCL 5 MG PO TABS: 5.0000 mg | ORAL_TABLET | Freq: Once | ORAL | Status: DC | PRN

## 2020-11-15 MED ORDER — OXYCODONE HCL ER 10 MG PO T12A
10.0000 mg | EXTENDED_RELEASE_TABLET | Freq: Two times a day (BID) | ORAL | Status: DC
  Administered 2020-11-15 – 2020-11-16 (×2): 10 mg via ORAL
  Filled 2020-11-15 (×2): qty 1

## 2020-11-15 MED ORDER — CEFAZOLIN SODIUM-DEXTROSE 2-4 GM/100ML-% IV SOLN
2.0000 g | Freq: Four times a day (QID) | INTRAVENOUS | Status: AC
Start: 2020-11-15 — End: 2020-11-15
  Administered 2020-11-15 (×2): 2 g via INTRAVENOUS
  Filled 2020-11-15 (×2): qty 100

## 2020-11-15 MED ORDER — AMISULPRIDE (ANTIEMETIC) 5 MG/2ML IV SOLN: 10.0000 mg | Freq: Once | INTRAVENOUS | Status: DC | PRN

## 2020-11-15 MED ORDER — PHENYLEPHRINE HCL-NACL 20-0.9 MG/250ML-% IV SOLN
INTRAVENOUS | Status: DC | PRN
  Administered 2020-11-15: 20 ug/min via INTRAVENOUS

## 2020-11-15 MED ORDER — CHLORHEXIDINE GLUCONATE 0.12 % MT SOLN
15.0000 mL | Freq: Once | OROMUCOSAL | Status: AC
  Administered 2020-11-15: 15 mL via OROMUCOSAL
  Filled 2020-11-15: qty 15

## 2020-11-15 SURGICAL SUPPLY — 65 items
ADH SKN CLS LQ APL DERMABOND (GAUZE/BANDAGES/DRESSINGS) ×1
BAG COUNTER SPONGE SURGICOUNT (BAG) ×2 IMPLANT
BAG DECANTER FOR FLEXI CONT (MISCELLANEOUS) ×2 IMPLANT
BAG SPNG CNTER NS LX DISP (BAG) ×1
CELLS DAT CNTRL 66122 CELL SVR (MISCELLANEOUS) IMPLANT
COOLER ICEMAN CLASSIC (MISCELLANEOUS) ×1 IMPLANT
COVER PERINEAL POST (MISCELLANEOUS) ×2 IMPLANT
COVER SURGICAL LIGHT HANDLE (MISCELLANEOUS) ×2 IMPLANT
CUP ACET PNNCL SECTR W/GRIP 56 (Hips) IMPLANT
DERMABOND ADHESIVE PROPEN (GAUZE/BANDAGES/DRESSINGS) ×1
DERMABOND ADVANCED .7 DNX6 (GAUZE/BANDAGES/DRESSINGS) IMPLANT
DRAPE C-ARM 42X72 X-RAY (DRAPES) ×2 IMPLANT
DRAPE POUCH INSTRU U-SHP 10X18 (DRAPES) ×2 IMPLANT
DRAPE STERI IOBAN 125X83 (DRAPES) ×2 IMPLANT
DRAPE U-SHAPE 47X51 STRL (DRAPES) ×4 IMPLANT
DRSG AQUACEL AG ADV 3.5X10 (GAUZE/BANDAGES/DRESSINGS) ×2 IMPLANT
DRSG AQUACEL AG ADV 3.5X14 (GAUZE/BANDAGES/DRESSINGS) ×1 IMPLANT
DURAPREP 26ML APPLICATOR (WOUND CARE) ×4 IMPLANT
ELECT BLADE 4.0 EZ CLEAN MEGAD (MISCELLANEOUS) ×2
ELECT REM PT RETURN 9FT ADLT (ELECTROSURGICAL) ×2
ELECTRODE BLDE 4.0 EZ CLN MEGD (MISCELLANEOUS) ×1 IMPLANT
ELECTRODE REM PT RTRN 9FT ADLT (ELECTROSURGICAL) ×1 IMPLANT
GLOVE SURG LTX SZ7 (GLOVE) ×4 IMPLANT
GLOVE SURG NEOP MICRO LF SZ7.5 (GLOVE) ×2 IMPLANT
GLOVE SURG SYN 7.5  E (GLOVE) ×8
GLOVE SURG SYN 7.5 E (GLOVE) ×4 IMPLANT
GLOVE SURG SYN 7.5 PF PI (GLOVE) ×4 IMPLANT
GLOVE SURG UNDER POLY LF SZ7 (GLOVE) ×10 IMPLANT
GOWN STRL REIN XL XLG (GOWN DISPOSABLE) ×2 IMPLANT
GOWN STRL REUS W/ TWL LRG LVL3 (GOWN DISPOSABLE) IMPLANT
GOWN STRL REUS W/ TWL XL LVL3 (GOWN DISPOSABLE) ×1 IMPLANT
GOWN STRL REUS W/TWL LRG LVL3 (GOWN DISPOSABLE)
GOWN STRL REUS W/TWL XL LVL3 (GOWN DISPOSABLE) ×2
HANDPIECE INTERPULSE COAX TIP (DISPOSABLE) ×2
HEAD CERAMIC DELTA 36 PLUS 1.5 (Hips) ×1 IMPLANT
HOOD PEEL AWAY FLYTE STAYCOOL (MISCELLANEOUS) ×4 IMPLANT
IV NS IRRIG 3000ML ARTHROMATIC (IV SOLUTION) ×2 IMPLANT
JET LAVAGE IRRISEPT WOUND (IRRIGATION / IRRIGATOR) ×2
KIT BASIN OR (CUSTOM PROCEDURE TRAY) ×2 IMPLANT
LAVAGE JET IRRISEPT WOUND (IRRIGATION / IRRIGATOR) ×1 IMPLANT
MARKER SKIN DUAL TIP RULER LAB (MISCELLANEOUS) ×2 IMPLANT
NDL SPNL 18GX3.5 QUINCKE PK (NEEDLE) ×1 IMPLANT
NEEDLE SPNL 18GX3.5 QUINCKE PK (NEEDLE) ×2 IMPLANT
PACK TOTAL JOINT (CUSTOM PROCEDURE TRAY) ×2 IMPLANT
PACK UNIVERSAL I (CUSTOM PROCEDURE TRAY) ×2 IMPLANT
PINN SECTOR W/GRIP ACE CUP 56 (Hips) ×2 IMPLANT
PINNACLE ALTRX PLUS 4 N 36X56 (Hips) ×1 IMPLANT
RETRACTOR WND ALEXIS 18 MED (MISCELLANEOUS) IMPLANT
RTRCTR WOUND ALEXIS 18CM MED (MISCELLANEOUS)
SAW OSC TIP CART 19.5X105X1.3 (SAW) ×2 IMPLANT
SET HNDPC FAN SPRY TIP SCT (DISPOSABLE) ×1 IMPLANT
STAPLER VISISTAT 35W (STAPLE) IMPLANT
STEM FEMORAL SZ6 HIGH ACTIS (Stem) ×1 IMPLANT
SUT ETHIBOND 2 V 37 (SUTURE) ×2 IMPLANT
SUT VIC AB 0 CT1 27 (SUTURE) ×2
SUT VIC AB 0 CT1 27XBRD ANBCTR (SUTURE) ×1 IMPLANT
SUT VIC AB 1 CTX 36 (SUTURE) ×2
SUT VIC AB 1 CTX36XBRD ANBCTR (SUTURE) ×1 IMPLANT
SUT VIC AB 2-0 CT1 27 (SUTURE) ×4
SUT VIC AB 2-0 CT1 TAPERPNT 27 (SUTURE) ×2 IMPLANT
SYR 50ML LL SCALE MARK (SYRINGE) ×2 IMPLANT
TOWEL GREEN STERILE (TOWEL DISPOSABLE) ×2 IMPLANT
TRAY FOLEY W/BAG SLVR 16FR (SET/KITS/TRAYS/PACK) ×2
TRAY FOLEY W/BAG SLVR 16FR ST (SET/KITS/TRAYS/PACK) ×1 IMPLANT
YANKAUER SUCT BULB TIP NO VENT (SUCTIONS) ×2 IMPLANT

## 2020-11-15 NOTE — Op Note (Signed)
RIGHT TOTAL HIP ARTHROPLASTY ANTERIOR APPROACH  Procedure Note Adrian [name]   [id number]  Pre-op Diagnosis: right hip degenerative joint disease     Post-op Diagnosis: same   Operative Procedures  1. Total hip replacement; Right hip; uncemented [id number]   Surgeon: Frankey Shown, M.D.  Assist: April Green, RNFA   Anesthesia: spinal  Prosthesis: Depuy Acetabulum: Pinnacle 56 mm Femur: Actis 6 HO Head: 36 mm size: +1.5 Liner: +4 Bearing Type: ceramic on poly  Total Hip Arthroplasty (Anterior Approach) Op Note:  After informed consent was obtained and the operative extremity marked in the holding area, the patient was brought back to the operating room and placed supine on the HANA table. Next, the operative extremity was prepped and draped in normal sterile fashion. Surgical timeout occurred verifying patient identification, surgical site, surgical procedure and administration of antibiotics.  A modified anterior Smith-Peterson approach to the hip was performed, using the interval between tensor fascia lata and sartorius.  Dissection was carried bluntly down onto the anterior hip capsule. The lateral femoral circumflex vessels were identified and coagulated. A capsulotomy was performed and the capsular flaps tagged for later repair.  There was a large joint effusion.  The neck osteotomy was performed. The femoral head was removed which showed severe wear, the acetabular rim was cleared of soft tissue and attention was turned to reaming the acetabulum.  Sequential reaming was performed under fluoroscopic guidance. We reamed to a size 55 mm, and then impacted the acetabular shell which had excellent fixation.  The liner was then placed after irrigation and attention turned to the femur.  After placing the femoral hook, the leg was taken to externally rotated, extended and adducted position taking care to perform soft tissue releases to allow for adequate mobilization of the femur. Soft tissue  was cleared from the shoulder of the greater trochanter and the hook elevator used to improve exposure of the proximal femur. Sequential broaching performed up to a size 6. Trial neck and head were placed. The leg was brought back up to neutral and the construct reduced.  Antibiotic irrigation was placed in the surgical wound and kept for at least 1 minute.  The position and sizing of components, offset and leg lengths were checked using fluoroscopy. Stability of the construct was checked in extension and external rotation without any subluxation or impingement of prosthesis. We dislocated the prosthesis, dropped the leg back into position, removed trial components, and irrigated copiously. The final stem and head was then placed, the leg brought back up, the system reduced and fluoroscopy used to verify positioning.  We irrigated, obtained hemostasis and closed the capsule using #2 ethibond suture.  One gram of vancomycin powder was placed in the surgical bed.   One gram of topical tranexamic acid was injected into the joint.  The fascia was closed with #1 vicryl plus, the deep fat layer was closed with 0 vicryl, the subcutaneous layers closed with 2.0 Vicryl Plus and the skin closed with 2.0 nylon and dermabond. A sterile dressing was applied. The patient was awakened in the operating room and taken to recovery in stable condition.  All sponge, needle, and instrument counts were correct at the end of the case.   Tawanna Cooler, my PA, was a medical necessity for opening, closing, limb positioning, retracting, exposing, and overall facilitation and timely completion of the surgery.  Position: supine  Complications: see description of procedure.  Time Out: performed   Drains/Packing: none  Estimated blood loss:  see anesthesia record  Returned to Recovery Room: in good condition.   Antibiotics: yes   Mechanical VTE (DVT) Prophylaxis: sequential compression devices, TED thigh-high  Chemical VTE  (DVT) Prophylaxis: aspirin   Fluid Replacement: see anesthesia record  Specimens Removed: 1 to pathology   Sponge and Instrument Count Correct? yes   PACU: portable radiograph - low AP   Plan/RTC: Return in 2 weeks for staple removal. Weight Bearing/Load Lower Extremity: full  Hip precautions: none Suture Removal: 2 weeks   N. Eduard Roux, MD [facility] 12:14 PM   Implant Name Type Inv. Item Serial No. Manufacturer Lot No. LRB No. Used Action  PINN SECTOR W/GRIP ACE CUP 56 [id number] Hips PINN SECTOR W/GRIP ACE CUP 56  [employer] [id number] Right 1 Implanted  PINNACLE ALTRX PLUS 4 N [id number] - [id number] Hips PINNACLE ALTRX PLUS 4 N [id number]  [employer] [id number] Right 1 Implanted  STEM FEMORAL SZ6 HIGH ACTIS - [id number] Stem STEM FEMORAL SZ6 HIGH ACTIS  [employer] [id number] Right 1 Implanted  HEAD CERAMIC DELTA 36 PLUS 1.5 - [id number] Hips HEAD CERAMIC DELTA 36 PLUS 1.5  [employer] [id number] Right 1 Implanted

## 2020-11-15 NOTE — Anesthesia Procedure Notes (Signed)
Spinal  Patient location during procedure: OR Start time: 11/15/2020 10:41 AM End time: 11/15/2020 10:46 AM Reason for block: surgical anesthesia Staffing Performed: anesthesiologist  Anesthesiologist: Lynda Rainwater, MD Preanesthetic Checklist Completed: patient identified, IV checked, site marked, risks and benefits discussed, surgical consent, monitors and equipment checked, pre-op evaluation and timeout performed Spinal Block Patient position: sitting Prep: DuraPrep Patient monitoring: heart rate, cardiac monitor, continuous pulse ox and blood pressure Approach: midline Location: L3-4 Injection technique: single-shot Needle Needle type: Quincke  Needle gauge: 25 G Needle length: 9 cm Assessment Sensory level: T4 Events: CSF return

## 2020-11-15 NOTE — Evaluation (Signed)
Physical Therapy Evaluation Patient Details Name: Adrian [name] MRN: [id number] DOB: Nov 15, 1950 Today's Date: 11/15/2020   History of Present Illness  Pt is a 70 y/o male s/p R THA, direct anterior on 8/15. PMH includes HTN and s/p lumbar surgery.  Clinical Impression  Pt is s/p surgery above with deficits below. Pt limited this session secondary to pain. Was able to stand and take side steps at EOB with min to min guard A and use of RW. Anticipate pt will progress well once pain controlled. Will continue to follow acutely.     Follow Up Recommendations Follow surgeon's recommendation for DC plan and follow-up therapies    Equipment Recommendations  Rolling walker with 5" wheels;3in1 (PT)    Recommendations for Other Services       Precautions / Restrictions Precautions Precautions: Fall Restrictions Weight Bearing Restrictions: Yes RLE Weight Bearing: Weight bearing as tolerated      Mobility  Bed Mobility Overal bed mobility: Needs Assistance Bed Mobility: Supine to Sit;Sit to Supine     Supine to sit: Min assist Sit to supine: Min assist   General bed mobility comments: Min A for RLE assist throughout. Increased time required secondary to pain.    Transfers Overall transfer level: Needs assistance Equipment used: Rolling walker (2 wheeled) Transfers: Sit to/from Stand Sit to Stand: Min assist         General transfer comment: Min A for lift assist and steadying to stand. Cues for hand placement.  Ambulation/Gait Ambulation/Gait assistance: Min guard   Assistive device: Rolling walker (2 wheeled)       General Gait Details: Took side steps at EOB, but further mobility limited secondary to pain. Min guard for safety. Cues for sequencing.  Stairs            Wheelchair Mobility    Modified Rankin (Stroke Patients Only)       Balance Overall balance assessment: Needs assistance Sitting-balance support: No upper extremity supported;Feet  supported Sitting balance-Leahy Scale: Good     Standing balance support: Bilateral upper extremity supported;During functional activity Standing balance-Leahy Scale: Poor Standing balance comment: Reliant on BUE support                             Pertinent Vitals/Pain Pain Assessment: 0-10 Pain Score: 9  Pain Location: R hip Pain Descriptors / Indicators: Aching;Operative site guarding Pain Intervention(s): Limited activity within patient's tolerance;Monitored during session;Repositioned    Home Living Family/patient expects to be discharged to:: Private residence Living Arrangements: Spouse/significant other Available Help at Discharge: Family Type of Home: House Home Access: Stairs to enter Entrance Stairs-Rails: Psychiatric nurse of Steps: 3 Home Layout: Two level Home Equipment: None      Prior Function Level of Independence: Independent               Hand Dominance        Extremity/Trunk Assessment   Upper Extremity Assessment Upper Extremity Assessment: Overall WFL for tasks assessed    Lower Extremity Assessment Lower Extremity Assessment: RLE deficits/detail RLE Deficits / Details: Deficits consistent with post op pain and weakness.    Cervical / Trunk Assessment Cervical / Trunk Assessment: Normal  Communication   Communication: No difficulties  Cognition Arousal/Alertness: Awake/alert Behavior During Therapy: WFL for tasks assessed/performed Overall Cognitive Status: Within Functional Limits for tasks assessed  General Comments General comments (skin integrity, edema, etc.): Pt's wife present during session    Exercises     Assessment/Plan    PT Assessment Patient needs continued PT services  PT Problem List Decreased strength;Decreased range of motion;Decreased balance;Decreased activity tolerance;Decreased mobility;Decreased knowledge of use of  DME;Decreased knowledge of precautions;Pain       PT Treatment Interventions Gait training;DME instruction;Functional mobility training;Stair training;Therapeutic activities;Therapeutic exercise;Balance training;Patient/family education    PT Goals (Current goals can be found in the Care Plan section)  Acute Rehab PT Goals Patient Stated Goal: to decrease pain PT Goal Formulation: With patient Time For Goal Achievement: 11/29/20 Potential to Achieve Goals: Good    Frequency 7X/week   Barriers to discharge        Co-evaluation               AM-PAC PT "6 Clicks" Mobility  Outcome Measure Help needed turning from your back to your side while in a flat bed without using bedrails?: A Little Help needed moving from lying on your back to sitting on the side of a flat bed without using bedrails?: A Little Help needed moving to and from a bed to a chair (including a wheelchair)?: A Little Help needed standing up from a chair using your arms (e.g., wheelchair or bedside chair)?: A Little Help needed to walk in hospital room?: A Little Help needed climbing 3-5 steps with a railing? : A Lot 6 Click Score: 17    End of Session Equipment Utilized During Treatment: Gait belt Activity Tolerance: Patient limited by pain Patient left: in bed;with call bell/phone within reach;with family/visitor present Nurse Communication: Mobility status;Other (comment) (catheter leaking) PT Visit Diagnosis: Repeated falls (R29.6);Pain Pain - Right/Left: Right Pain - part of body: Hip    Time: [id number] PT Time Calculation (min) (ACUTE ONLY): 30 min   Charges:   PT Evaluation $PT Eval Low Complexity: 1 Low PT Treatments $Therapeutic Activity: 8-22 mins        Lou Miner, DPT  Acute Rehabilitation Services  Pager: [phone] Office: [phone]   Rudean Hitt 11/15/2020, 4:59 PM

## 2020-11-15 NOTE — Transfer of Care (Signed)
Immediate Anesthesia Transfer of Care Note  Patient: Adrian [name]  Procedure(s) Performed: RIGHT TOTAL HIP ARTHROPLASTY ANTERIOR APPROACH (Right: Hip)  Patient Location: PACU  Anesthesia Type:MAC and Spinal  Level of Consciousness: awake, alert  and oriented  Airway & Oxygen Therapy: Patient Spontanous Breathing  Post-op Assessment: Vital signs stable  Post vital signs: Reviewed and stable  Last Vitals:  Vitals Value Taken Time  BP 82/51 11/15/20 1247  Temp    Pulse 80 11/15/20 1249  Resp 47 11/15/20 1249  SpO2 98 % 11/15/20 1249  Vitals shown include unvalidated device data.  Last Pain:  Vitals:   11/15/20 0825  TempSrc:   PainSc: 0-No pain         Complications: No notable events documented.

## 2020-11-15 NOTE — Anesthesia Procedure Notes (Signed)
Procedure Name: MAC Date/Time: 11/15/2020 11:00 AM Performed by: Griffin Dakin, CRNA Pre-anesthesia Checklist: Patient identified, Emergency Drugs available, Suction available, Patient being monitored and Timeout performed Patient Re-evaluated:Patient Re-evaluated prior to induction Oxygen Delivery Method: Simple face mask Induction Type: IV induction Placement Confirmation: breath sounds checked- equal and bilateral and positive ETCO2 Dental Injury: Teeth and Oropharynx as per pre-operative assessment

## 2020-11-15 NOTE — Care Plan (Signed)
Ortho Bundle Case Management Note  Patient Details  Name: Adrian [name] MRN: [id number] Date of Birth: Sep 11, 1950   [facility] RNCM call to patient to discuss his upcoming Right total hip arthroplasty with Dr. Erlinda Hong. He is an Ortho bundle patient through [facility] and is agreeable to case management. He lives with his wife, who will be assisting him at home after discharge. He lives in a 2 story home and his bedroom is upstairs. There are 14 steps to the 2nd level and 3 steps to get into his home he states. He will need a FWW and 3in1/BSC prior to discharge. These will be ordered and provided at hospital. Will rely on [place] staff to assist with providing from stock. Anticipate HHPT will be needed. Choice provided and referral made to [facility]. Reviewed all post op care instructions. Will continue to follow for needs.                  DME Arranged:  3-N-1, Walker rolling DME Agency:  [facility]  HH Arranged:  PT HH Agency:  [place]  Additional Comments: Please contact me with any questions of if this plan should need to change.  Jamse Arn, RN, BSN, [employer]  [phone] 11/15/2020, 4:38 PM

## 2020-11-15 NOTE — Discharge Instructions (Signed)

## 2020-11-15 NOTE — H&P (Signed)
PREOPERATIVE H&P  Chief Complaint: right hip degenerative joint disease  HPI: Adrian [name] is a 70 y.o. male who presents for surgical treatment of right hip degenerative joint disease.  He denies any changes in medical history.  Past Medical History:  Diagnosis Date   Arthritis    Atypical nevus 04/20/2004   left low back (wider shave)   Basal cell carcinoma 01/16/1995   left back (cx3 26f exc)   BCC (basal cell carcinoma of skin) 10/13/2002   left supra brow (cx3 fu exc)   BCC (basal cell carcinoma of skin) 04/20/2004   center upper back (cx3 552f   BCC (basal cell carcinoma of skin) 04/24/2006   upper mid back (cx3 10f48f  BCC (basal cell carcinoma of skin) 10/28/2012   left upper back (cx3 10fu73f BCC (basal cell carcinoma of skin) 10/28/2012   right back upper (cx3 10fu)46fBCC (basal cell carcinoma of skin) 10/28/2012   right deltoid (cx3 10fu) 83fCC (basal cell carcinoma of skin) 10/25/2015   right chest no treatment basaliod neoplasm   DJD (degenerative joint disease)    GERD (gastroesophageal reflux disease)    Hydrocele    Hypertension    Right hydrocele 2021   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   left temple (cx3 10fu)  49fasonal allergies    Past Surgical History:  Procedure Laterality Date   APPENDECTOMY     hemorrhaged post op   [place]   DIAGNOSTIC LAPAROSCOPY     HERNIA REPAIR     umbilical   HYDROCELE EXCISION Right 07/14/2019   Procedure: HYDROCELECTOMY ADULT;  Surgeon: Ottelin[place]athie [place];  Laterality: Right;   KNEE ARTHROSCOPY  04/03/2008   lt   LUMBAR LAMINECTOMY  04/03/2006   SEPTOPLASTY     TONSILLECTOMY Bilateral 1970   Social History   Socioeconomic History   Marital status: Married    Spouse name: Not on file   Number of children: Not on file   Years of education: Not on file   Highest education level: Not on file   Occupational History   Not on file  Tobacco Use   Smoking status: Former    Types: Cigarettes    Start date: 04/03/1972    Quit date: 04/03/1973    Years since quitting: 47.6   Smokeless tobacco: Never  Vaping Use   Vaping Use: Never used  Substance and Sexual Activity   Alcohol use: Not Currently   Drug use: No   Sexual activity: Yes  Other Topics Concern   Not on file  Social History Narrative   Not on file   Social Determinants of Health   Financial Resource Strain: Not on file  Food Insecurity: Not on file  Transportation Needs: Not on file  Physical Activity: Not on file  Stress: Not on file  Social Connections: Not on file   No family history on file. No Known Allergies Prior to Admission medications   Medication Sig Start Date End Date Taking? Authorizing Provider  acetaminophen (TYLENOL) 500 MG tablet Take 500 mg by mouth every 6 (six) hours as needed for moderate pain.   Yes [provider]  Cholecalciferol (VITAMIN D) 50 MCG (2000 UT) CAPS Take 2,000 Units by mouth daily.   Yes [provider]  Coenzyme Q10 (COQ10 PO) Take 300 mg by  mouth daily.   Yes [provider]  fluticasone (FLONASE) 50 MCG/ACT nasal spray Place 2 sprays into both nostrils daily.   Yes [provider]  ibuprofen (ADVIL) 200 MG tablet Take 200 mg by mouth every 6 (six) hours as needed for moderate pain.   Yes [provider]  olmesartan (BENICAR) 20 MG tablet Take 20 mg by mouth daily.   Yes [provider]  rosuvastatin (CRESTOR) 20 MG tablet Take 20 mg by mouth daily.   Yes [provider]  aspirin EC 81 MG tablet Take 1 tablet (81 mg total) by mouth 2 (two) times daily. To be taken after surgery 11/08/20   Aundra Dubin, PA-C  docusate sodium (COLACE) 100 MG capsule Take 1 capsule (100 mg total) by mouth daily as needed. To be taken after surgery 11/08/20 11/08/21  Aundra Dubin, PA-C  methocarbamol (ROBAXIN) 500 MG tablet Take 1  tablet (500 mg total) by mouth 2 (two) times daily as needed. To be taken after surgery 11/08/20   Aundra Dubin, PA-C  ondansetron (ZOFRAN) 4 MG tablet Take 1 tablet (4 mg total) by mouth every 8 (eight) hours as needed for nausea or vomiting. To be taken after surgery 11/08/20   Aundra Dubin, PA-C  oxyCODONE-acetaminophen (PERCOCET) 5-325 MG tablet Take 1-2 tablets by mouth every 6 (six) hours as needed. To be taken after surgery 11/08/20   Aundra Dubin, PA-C     Positive ROS: All other systems have been reviewed and were otherwise negative with the exception of those mentioned in the HPI and as above.  Physical Exam: General: Alert, no acute distress Cardiovascular: No pedal edema Respiratory: No cyanosis, no use of accessory musculature GI: abdomen soft Skin: No lesions in the area of chief complaint Neurologic: Sensation intact distally Psychiatric: Patient is competent for consent with normal mood and affect Lymphatic: no lymphedema  MUSCULOSKELETAL: exam stable  Assessment: right hip degenerative joint disease  Plan: Plan for Procedure(s): RIGHT TOTAL HIP ARTHROPLASTY ANTERIOR APPROACH  The risks benefits and alternatives were discussed with the patient including but not limited to the risks of nonoperative treatment, versus surgical intervention including infection, bleeding, nerve injury,  blood clots, cardiopulmonary complications, morbidity, mortality, among others, and they were willing to proceed.   Preoperative templating of the joint replacement has been completed, documented, and submitted to the Operating Room personnel in order to optimize intra-operative equipment management.   Eduard Roux, MD 11/15/2020 7:14 AM

## 2020-11-15 NOTE — Progress Notes (Signed)
   Subjective:  Patient reports pain as moderate.  Walked a little in the room with PT  Objective:   VITALS:   Vitals:   11/15/20 1430 11/15/20 1445 11/15/20 1500 11/15/20 1524  BP: 118/78 123/73 123/73 (!) 142/83  Pulse: 67 71 72 78  Resp: '20 18 18 18  '$ Temp:   (!) 97.2 F (36.2 C) (!) 97.5 F (36.4 C)  TempSrc:    Oral  SpO2: 100% 100% 100% 99%  Weight:      Height:        Neurologically intact Sensation intact distally Intact pulses distally Dorsiflexion/Plantar flexion intact   Lab Results  Component Value Date   WBC 7.5 11/05/2020   HGB 15.7 11/05/2020   HCT 47.5 11/05/2020   MCV 85.3 11/05/2020   PLT 262 11/05/2020     Assessment/Plan:  Day of Surgery   - Expected postop acute blood loss anemia - will monitor for symptoms - Up with PT/OT - DVT ppx - SCDs, ambulation, aspirin - WBAT operative extremity - Pain control - plan is for d/c home tomorrow when he clears PT  Eduard Roux 11/15/2020, 8:40 PM [phone]

## 2020-11-16 ENCOUNTER — Encounter (HOSPITAL_COMMUNITY): Payer: Self-pay | Admitting: Orthopaedic Surgery

## 2020-11-16 DIAGNOSIS — Z20822 Contact with and (suspected) exposure to covid-19: Secondary | ICD-10-CM | POA: Diagnosis not present

## 2020-11-16 DIAGNOSIS — Z87891 Personal history of nicotine dependence: Secondary | ICD-10-CM | POA: Diagnosis not present

## 2020-11-16 DIAGNOSIS — Z7982 Long term (current) use of aspirin: Secondary | ICD-10-CM | POA: Diagnosis not present

## 2020-11-16 DIAGNOSIS — Z85828 Personal history of other malignant neoplasm of skin: Secondary | ICD-10-CM | POA: Diagnosis not present

## 2020-11-16 DIAGNOSIS — M1611 Unilateral primary osteoarthritis, right hip: Secondary | ICD-10-CM | POA: Diagnosis not present

## 2020-11-16 DIAGNOSIS — Z79899 Other long term (current) drug therapy: Secondary | ICD-10-CM | POA: Diagnosis not present

## 2020-11-16 DIAGNOSIS — I10 Essential (primary) hypertension: Secondary | ICD-10-CM | POA: Diagnosis not present

## 2020-11-16 LAB — BASIC METABOLIC PANEL
Anion gap: 7 (ref 5–15)
BUN: 9 mg/dL (ref 8–23)
CO2: 27 mmol/L (ref 22–32)
Calcium: 8.8 mg/dL — ABNORMAL LOW (ref 8.9–10.3)
Chloride: 97 mmol/L — ABNORMAL LOW (ref 98–111)
Creatinine, Ser: 0.73 mg/dL (ref 0.61–1.24)
GFR, Estimated: >60 mL/min (ref >60)
Glucose, Bld: 131 mg/dL — ABNORMAL HIGH (ref 70–99)
Potassium: 4.1 mmol/L (ref 3.5–5.1)
Sodium: 131 mmol/L — ABNORMAL LOW (ref 135–145)

## 2020-11-16 LAB — CBC
HCT: 36 % — ABNORMAL LOW (ref 39.0–52.0)
Hemoglobin: 12.1 g/dL — ABNORMAL LOW (ref 13.0–17.0)
MCH: 28.5 pg (ref 26.0–34.0)
MCHC: 33.6 g/dL (ref 30.0–36.0)
MCV: 84.9 fL (ref 80.0–100.0)
Platelets: 198 10*3/uL (ref 150–400)
RBC: 4.24 MIL/uL (ref 4.22–5.81)
RDW: 13.3 % (ref 11.5–15.5)
WBC: 9.9 10*3/uL (ref 4.0–10.5)
nRBC: 0 % (ref 0.0–0.2)

## 2020-11-16 MED ORDER — FLUTICASONE PROPIONATE 50 MCG/ACT NA SUSP
2.0000 | Freq: Every day | NASAL | Status: DC
  Administered 2020-11-16: 2 via NASAL
  Filled 2020-11-16: qty 16

## 2020-11-16 NOTE — Progress Notes (Signed)
Physical Therapy Treatment Patient Details Name: Adrian [name] MRN: [id number] DOB: 03/18/1951 Today's Date: 11/16/2020    History of Present Illness Pt is a 70 y/o male s/p R THA, direct anterior on 8/15. PMH includes HTN and s/p lumbar surgery.    PT Comments    Pt supine in bed on entry with no complaints of pain, eager to work with therapy. Started with supine LE exercise. Pt needs extra time due to increased guarding and difficulty sequencing. Pt is currently min A for bed mobility, min A for transfers and min guard for ambulation with RW. Pt with slight increase in pain with activity. Pt able to stand up and use toilet with single UE support. PT will follow back this afternoon for stair training prior to discharge.     Follow Up Recommendations  Follow surgeon's recommendation for DC plan and follow-up therapies     Equipment Recommendations  Rolling walker with 5" wheels;3in1 (PT)       Precautions / Restrictions Precautions Precautions: Fall Restrictions Weight Bearing Restrictions: Yes RLE Weight Bearing: Weight bearing as tolerated    Mobility  Bed Mobility Overal bed mobility: Needs Assistance Bed Mobility: Supine to Sit;Sit to Supine     Supine to sit: Min assist     General bed mobility comments: Min A initially for RLE assist across bed, Maximal cuing for sequencing, and then became min guard for management of LE to floor and trunk to upright. Increased time due to pain and sequencing    Transfers Overall transfer level: Needs assistance Equipment used: Rolling walker (2 wheeled) Transfers: Sit to/from Stand Sit to Stand: Min assist         General transfer comment: Min A for lift assist and steadying to stand. Cues for hand placement.  Ambulation/Gait Ambulation/Gait assistance: Min guard Gait Distance (Feet): 250 Feet Assistive device: Rolling walker (2 wheeled) Gait Pattern/deviations: Step-to pattern;Step-through pattern;Decreased weight shift to  right;Decreased stance time - right;Decreased step length - left;Antalgic Gait velocity: slowed Gait velocity interpretation: <1.31 ft/sec, indicative of household ambulator General Gait Details: min guard for safety, progressed from step to>step through pattern, working to progress to taking more than 2 steps at a time, pt need constant cuing for relaxation especially in grip on RW         Balance Overall balance assessment: Needs assistance Sitting-balance support: No upper extremity supported;Feet supported Sitting balance-Leahy Scale: Good     Standing balance support: Bilateral upper extremity supported;During functional activity Standing balance-Leahy Scale: Poor Standing balance comment: Reliant on BUE support                            Cognition Arousal/Alertness: Awake/alert Behavior During Therapy: WFL for tasks assessed/performed Overall Cognitive Status: Within Functional Limits for tasks assessed                                 General Comments: very guarded and over analyses      Exercises Total Joint Exercises Ankle Circles/Pumps: AROM;Both;20 reps;Supine Quad Sets: AROM;Right;10 reps;Supine Short Arc Quad: AROM;10 reps;Supine Heel Slides: AROM;Right;10 reps;Supine Hip ABduction/ADduction: AROM;Both;10 reps;Supine    General Comments General comments (skin integrity, edema, etc.): VSS on RA, discussed stair training this afternoon      Pertinent Vitals/Pain Pain Assessment: Faces Faces Pain Scale: Hurts little more Pain Location: R hip Pain Descriptors / Indicators: Aching;Operative site guarding Pain  Intervention(s): Limited activity within patient's tolerance;Monitored during session;Repositioned     PT Goals (current goals can now be found in the care plan section) Acute Rehab PT Goals Patient Stated Goal: to decrease pain PT Goal Formulation: With patient Time For Goal Achievement: 11/29/20 Potential to Achieve Goals:  Good Progress towards PT goals: Progressing toward goals    Frequency    7X/week      PT Plan Current plan remains appropriate       AM-PAC PT "6 Clicks" Mobility   Outcome Measure  Help needed turning from your back to your side while in a flat bed without using bedrails?: A Little Help needed moving from lying on your back to sitting on the side of a flat bed without using bedrails?: A Little Help needed moving to and from a bed to a chair (including a wheelchair)?: A Little Help needed standing up from a chair using your arms (e.g., wheelchair or bedside chair)?: A Little Help needed to walk in hospital room?: A Little Help needed climbing 3-5 steps with a railing? : A Lot 6 Click Score: 17    End of Session Equipment Utilized During Treatment: Gait belt Activity Tolerance: Patient limited by pain Patient left: with call bell/phone within reach;in chair Nurse Communication: Mobility status PT Visit Diagnosis: Repeated falls (R29.6);Pain Pain - Right/Left: Right Pain - part of body: Hip     Time: [id number] PT Time Calculation (min) (ACUTE ONLY): 48 min  Charges:  $Gait Training: 23-37 mins $Therapeutic Exercise: 8-22 mins                     Naesha Buckalew B. Migdalia Dk PT, DPT Acute Rehabilitation Services Pager [phone] Office [phone]    [place] 11/16/2020, 10:51 AM

## 2020-11-16 NOTE — Progress Notes (Signed)
Patient was transported via wheelchair by volunteer for discharge home; in no acute distress nor complaints of pain nor discomfort; room was checked and accounted for all his belongings; all equipments brought along by wife to her car; discharge instructions given to patient and his wife by RN and they both verbalized understanding on the instructions given.

## 2020-11-16 NOTE — Progress Notes (Signed)
Patient doing well.  Vitals stable.  No complaints aside from hip pain and anterior thigh pain.  Denies any chest pain, shortness of breath, calf pain.  He was able to ambulate long distance yesterday without significant dizziness or lightheadedness.  Did well with therapy today and plan for stair training this afternoon.  If he is able to do stairs then plan to discharge home.  Patient is agreeable to plan.  On exam dressings are intact without gross blood or drainage.  No calf tenderness.  Right foot warm and well-perfused.  Ankle dorsiflexion/plantarflexion intact.

## 2020-11-16 NOTE — Progress Notes (Signed)
Physical Therapy Treatment Patient Details Name: Adrian [name] MRN: [id number] DOB: 1950/12/06 Today's Date: 11/16/2020    History of Present Illness Pt is a 70 y/o male s/p R THA, direct anterior on 8/15. PMH includes HTN and s/p lumbar surgery.    PT Comments    Focus of session stair training. Pt has 14 step to bedroom, stating that couch downstairs is too uncomfortable to sleep on. Unfortunately, pt only has handrail to top of stairs on L. Making proper sequencing difficult. Pt is min guard for ascending steps but requires min A for descent of steps. Asked pt to work with HHPT on stair training when they come. Also to descend steps on his bottom if he feels the least bit unable to safely descend the stairs. Pt wife in room and discharge imminent.     Follow Up Recommendations  Follow surgeon's recommendation for DC plan and follow-up therapies     Equipment Recommendations  Rolling walker with 5" wheels;3in1 (PT)       Precautions / Restrictions Precautions Precautions: Fall Restrictions Weight Bearing Restrictions: Yes RLE Weight Bearing: Weight bearing as tolerated    Mobility  Transfers                 General transfer comment: standing in bathroom on entry  Ambulation/Gait Ambulation/Gait assistance: Min guard Gait Distance (Feet): 25 Feet Assistive device: Rolling walker (2 wheeled) Gait Pattern/deviations: Step-to pattern;Step-through pattern;Decreased weight shift to right;Decreased stance time - right;Decreased step length - left;Antalgic Gait velocity: slowed Gait velocity interpretation: <1.31 ft/sec, indicative of household ambulator General Gait Details: min guard for safety, gait more fluid   Stairs Stairs: Yes Stairs assistance: Min guard;Min assist Stair Management: One rail Left;Sideways;Step to pattern Number of Stairs: 14 General stair comments: pt very nervous about stairs to get to bed room on second floor says couch downstairs is too  uncomfortable. Educated on sequencing of up with L and down with R unfortunately pt only has rail on L causing change in sequencing. Pt is able to ascend steps with min guard. Pt requires minA for coming down due to decreased R knee flexion to move down to next step. Educated pt that if he needs to get downstairs and he is not feeling strong enough he can bump down on his bottom. Pt feels confident enough with steps to go home       Balance Overall balance assessment: Needs assistance Sitting-balance support: No upper extremity supported;Feet supported Sitting balance-Leahy Scale: Good     Standing balance support: Bilateral upper extremity supported;During functional activity Standing balance-Leahy Scale: Fair Standing balance comment: able to static stand without UE support to wash hands                            Cognition Arousal/Alertness: Awake/alert Behavior During Therapy: WFL for tasks assessed/performed Overall Cognitive Status: Within Functional Limits for tasks assessed                                 General Comments: continues to over think movement         General Comments General comments (skin integrity, edema, etc.): VSS on RA, wife in room very nervous about taking him home.      Pertinent Vitals/Pain Pain Assessment: Faces Faces Pain Scale: Hurts little more Pain Location: R hip Pain Descriptors / Indicators: Aching;Operative site guarding Pain Intervention(s):  Limited activity within patient's tolerance;Monitored during session;Repositioned     PT Goals (current goals can now be found in the care plan section) Acute Rehab PT Goals Patient Stated Goal: to decrease pain PT Goal Formulation: With patient Time For Goal Achievement: 11/29/20 Potential to Achieve Goals: Good Progress towards PT goals: Progressing toward goals    Frequency    7X/week      PT Plan Current plan remains appropriate       AM-PAC PT "6  Clicks" Mobility   Outcome Measure  Help needed turning from your back to your side while in a flat bed without using bedrails?: A Little Help needed moving from lying on your back to sitting on the side of a flat bed without using bedrails?: A Little Help needed moving to and from a bed to a chair (including a wheelchair)?: A Little Help needed standing up from a chair using your arms (e.g., wheelchair or bedside chair)?: A Little Help needed to walk in hospital room?: A Little Help needed climbing 3-5 steps with a railing? : A Lot 6 Click Score: 17    End of Session Equipment Utilized During Treatment: Gait belt Activity Tolerance: Patient limited by pain Patient left: Other (comment) (standing in room with wife packing up) Nurse Communication: Mobility status PT Visit Diagnosis: Repeated falls (R29.6);Pain Pain - Right/Left: Right Pain - part of body: Hip     Time: 1345-1411 PT Time Calculation (min) (ACUTE ONLY): 26 min  Charges:  $Gait Training: 23-37 mins                     Tanaka Gillen B. Migdalia Dk PT, DPT Acute Rehabilitation Services Pager [phone] Office [phone]    [place] 11/16/2020, 4:16 PM

## 2020-11-16 NOTE — Anesthesia Postprocedure Evaluation (Signed)
Anesthesia Post Note  Patient: Adrian [name]  Procedure(s) Performed: RIGHT TOTAL HIP ARTHROPLASTY ANTERIOR APPROACH (Right: Hip)     Patient location during evaluation: PACU Anesthesia Type: General Level of consciousness: awake and alert Pain management: pain level controlled Vital Signs Assessment: post-procedure vital signs reviewed and stable Respiratory status: spontaneous breathing, nonlabored ventilation and respiratory function stable Cardiovascular status: blood pressure returned to baseline and stable Postop Assessment: no apparent nausea or vomiting Anesthetic complications: no   No notable events documented.  Last Vitals:  Vitals:   11/16/20 0013 11/16/20 0314  BP: 133/71 130/78  Pulse: 91 99  Resp: 18 20  Temp: 36.9 C 37 C  SpO2: 100% 100%    Last Pain:  Vitals:   11/16/20 0642  TempSrc:   PainSc: [place]

## 2020-11-17 ENCOUNTER — Telehealth: Payer: Self-pay | Admitting: *Deleted

## 2020-11-17 LAB — SARS CORONAVIRUS 2 (TAT 6-24 HRS): SARS Coronavirus 2: NEGATIVE

## 2020-11-17 NOTE — Telephone Encounter (Signed)
Ortho bundle D/C call attempted. No answer and left VM for patient.

## 2020-11-18 DIAGNOSIS — G8929 Other chronic pain: Secondary | ICD-10-CM | POA: Diagnosis not present

## 2020-11-18 DIAGNOSIS — Z85828 Personal history of other malignant neoplasm of skin: Secondary | ICD-10-CM | POA: Diagnosis not present

## 2020-11-18 DIAGNOSIS — Z87891 Personal history of nicotine dependence: Secondary | ICD-10-CM | POA: Diagnosis not present

## 2020-11-18 DIAGNOSIS — I10 Essential (primary) hypertension: Secondary | ICD-10-CM | POA: Diagnosis not present

## 2020-11-18 DIAGNOSIS — Z7982 Long term (current) use of aspirin: Secondary | ICD-10-CM | POA: Diagnosis not present

## 2020-11-18 DIAGNOSIS — Z96641 Presence of right artificial hip joint: Secondary | ICD-10-CM | POA: Diagnosis not present

## 2020-11-18 DIAGNOSIS — Z471 Aftercare following joint replacement surgery: Secondary | ICD-10-CM | POA: Diagnosis not present

## 2020-11-18 DIAGNOSIS — M545 Low back pain, unspecified: Secondary | ICD-10-CM | POA: Diagnosis not present

## 2020-11-18 DIAGNOSIS — K219 Gastro-esophageal reflux disease without esophagitis: Secondary | ICD-10-CM | POA: Diagnosis not present

## 2020-11-19 DIAGNOSIS — Z96641 Presence of right artificial hip joint: Secondary | ICD-10-CM | POA: Diagnosis not present

## 2020-11-19 DIAGNOSIS — G8929 Other chronic pain: Secondary | ICD-10-CM | POA: Diagnosis not present

## 2020-11-19 DIAGNOSIS — I10 Essential (primary) hypertension: Secondary | ICD-10-CM | POA: Diagnosis not present

## 2020-11-19 DIAGNOSIS — Z85828 Personal history of other malignant neoplasm of skin: Secondary | ICD-10-CM | POA: Diagnosis not present

## 2020-11-19 DIAGNOSIS — Z87891 Personal history of nicotine dependence: Secondary | ICD-10-CM | POA: Diagnosis not present

## 2020-11-19 DIAGNOSIS — Z471 Aftercare following joint replacement surgery: Secondary | ICD-10-CM | POA: Diagnosis not present

## 2020-11-19 DIAGNOSIS — K219 Gastro-esophageal reflux disease without esophagitis: Secondary | ICD-10-CM | POA: Diagnosis not present

## 2020-11-19 DIAGNOSIS — Z7982 Long term (current) use of aspirin: Secondary | ICD-10-CM | POA: Diagnosis not present

## 2020-11-19 DIAGNOSIS — M545 Low back pain, unspecified: Secondary | ICD-10-CM | POA: Diagnosis not present

## 2020-11-22 ENCOUNTER — Telehealth: Payer: Self-pay

## 2020-11-22 DIAGNOSIS — M545 Low back pain, unspecified: Secondary | ICD-10-CM | POA: Diagnosis not present

## 2020-11-22 DIAGNOSIS — Z87891 Personal history of nicotine dependence: Secondary | ICD-10-CM | POA: Diagnosis not present

## 2020-11-22 DIAGNOSIS — Z85828 Personal history of other malignant neoplasm of skin: Secondary | ICD-10-CM | POA: Diagnosis not present

## 2020-11-22 DIAGNOSIS — K219 Gastro-esophageal reflux disease without esophagitis: Secondary | ICD-10-CM | POA: Diagnosis not present

## 2020-11-22 DIAGNOSIS — G8929 Other chronic pain: Secondary | ICD-10-CM | POA: Diagnosis not present

## 2020-11-22 DIAGNOSIS — I10 Essential (primary) hypertension: Secondary | ICD-10-CM | POA: Diagnosis not present

## 2020-11-22 DIAGNOSIS — Z7982 Long term (current) use of aspirin: Secondary | ICD-10-CM | POA: Diagnosis not present

## 2020-11-22 DIAGNOSIS — Z471 Aftercare following joint replacement surgery: Secondary | ICD-10-CM | POA: Diagnosis not present

## 2020-11-22 DIAGNOSIS — Z96641 Presence of right artificial hip joint: Secondary | ICD-10-CM | POA: Diagnosis not present

## 2020-11-22 NOTE — Telephone Encounter (Signed)
Right leg swollen from thigh down to toes. Pt would like to still confirm he is on the right track

## 2020-11-23 NOTE — Telephone Encounter (Signed)
Sent mychart msg.

## 2020-11-24 DIAGNOSIS — Z96641 Presence of right artificial hip joint: Secondary | ICD-10-CM | POA: Diagnosis not present

## 2020-11-24 DIAGNOSIS — M545 Low back pain, unspecified: Secondary | ICD-10-CM | POA: Diagnosis not present

## 2020-11-24 DIAGNOSIS — Z85828 Personal history of other malignant neoplasm of skin: Secondary | ICD-10-CM | POA: Diagnosis not present

## 2020-11-24 DIAGNOSIS — Z7982 Long term (current) use of aspirin: Secondary | ICD-10-CM | POA: Diagnosis not present

## 2020-11-24 DIAGNOSIS — Z471 Aftercare following joint replacement surgery: Secondary | ICD-10-CM | POA: Diagnosis not present

## 2020-11-24 DIAGNOSIS — K219 Gastro-esophageal reflux disease without esophagitis: Secondary | ICD-10-CM | POA: Diagnosis not present

## 2020-11-24 DIAGNOSIS — I10 Essential (primary) hypertension: Secondary | ICD-10-CM | POA: Diagnosis not present

## 2020-11-24 DIAGNOSIS — G8929 Other chronic pain: Secondary | ICD-10-CM | POA: Diagnosis not present

## 2020-11-24 DIAGNOSIS — Z87891 Personal history of nicotine dependence: Secondary | ICD-10-CM | POA: Diagnosis not present

## 2020-11-24 MED ORDER — OXYCODONE-ACETAMINOPHEN 5-325 MG PO TABS: 1.0000 | ORAL_TABLET | Freq: Two times a day (BID) | ORAL | 0 refills | Status: DC | PRN

## 2020-11-24 NOTE — Addendum Note (Signed)
Addended by: Azucena Cecil on: 11/24/2020 07:21 PM   Modules accepted: Orders

## 2020-11-24 NOTE — Discharge Summary (Signed)
Physician Discharge Summary      Patient ID: Adrian [name] MRN: [id number] DOB/AGE: [birth year] 70 y.o.  Admit date: 11/15/2020 Discharge date: 11/16/2020  Admission Diagnoses:  Principal Problem:   Primary osteoarthritis of right hip Active Problems:   Status post total replacement of right hip   Discharge Diagnoses:  Same  Surgeries: Procedure(s): RIGHT TOTAL HIP ARTHROPLASTY ANTERIOR APPROACH on 11/15/2020   Consultants:   Discharged Condition: Stable  Hospital Course: Adrian [name] is an 70 y.o. male who was admitted 11/15/2020 with a chief complaint of right hip pain, and found to have a diagnosis of right total hip arthroplasty.  They were brought to the operating room on 11/15/2020 and underwent the above named procedures.  Pt awoke from anesthesia without complication and was transferred to the floor. On POD1, patient's pain was well controlled and he was able to mobilize well with therapy.  No complaint of lightheadedness or dizziness.  He was discharged home on POD 1..  Pt will f/u with Dr. Erlinda Hong in clinic in ~2 weeks.   Antibiotics given:  Anti-infectives (From admission, onward)    Start     Dose/Rate Route Frequency Ordered Stop   11/15/20 1615  ceFAZolin (ANCEF) IVPB 2g/100 mL premix        2 g 200 mL/hr over 30 Minutes Intravenous Every 6 hours 11/15/20 1529 11/15/20 2059   11/15/20 1124  vancomycin (VANCOCIN) powder  Status:  Discontinued          As needed 11/15/20 1125 11/15/20 1242   11/15/20 0815  ceFAZolin (ANCEF) IVPB 2g/100 mL premix        2 g 200 mL/hr over 30 Minutes Intravenous On call to O.R. 11/15/20 0802 11/15/20 1048     .  Recent vital signs:  Vitals:   11/16/20 0739 11/16/20 1217  BP: (!) 113/59 129/67  Pulse: 93 94  Resp: 17 18  Temp: 98.3 F (36.8 C) (!) 97.4 F (36.3 C)  SpO2: 95% 95%    Recent laboratory studies:  Results for orders placed or performed during the hospital encounter of 11/15/20  SARS Coronavirus 2 by RT PCR  (hospital order, performed in [place] hospital lab) Nasopharyngeal Nasopharyngeal Swab   Specimen: Nasopharyngeal Swab  Result Value Ref Range   SARS Coronavirus 2 NEGATIVE NEGATIVE  CBC  Result Value Ref Range   WBC 9.9 4.0 - 10.5 K/uL   RBC 4.24 4.22 - 5.81 MIL/uL   Hemoglobin 12.1 (L) 13.0 - 17.0 g/dL   HCT 36.0 (L) 39.0 - 52.0 %   MCV 84.9 80.0 - 100.0 fL   MCH 28.5 26.0 - 34.0 pg   MCHC 33.6 30.0 - 36.0 g/dL   RDW 13.3 11.5 - 15.5 %   Platelets 198 150 - 400 K/uL   nRBC 0.0 0.0 - 0.2 %  Basic metabolic panel  Result Value Ref Range   Sodium 131 (L) 135 - 145 mmol/L   Potassium 4.1 3.5 - 5.1 mmol/L   Chloride 97 (L) 98 - 111 mmol/L   CO2 27 22 - 32 mmol/L   Glucose, Bld 131 (H) 70 - 99 mg/dL   BUN 9 8 - 23 mg/dL   Creatinine, Ser 0.73 0.61 - 1.24 mg/dL   Calcium 8.8 (L) 8.9 - 10.3 mg/dL   GFR, Estimated >60 >60 mL/min   Anion gap 7 5 - 15    Discharge Medications:   Allergies as of 11/16/2020   No Known Allergies  Medication List     TAKE these medications    acetaminophen 500 MG tablet Commonly known as: TYLENOL Take 500 mg by mouth every 6 (six) hours as needed for moderate pain.   aspirin EC 81 MG tablet Take 1 tablet (81 mg total) by mouth 2 (two) times daily. To be taken after surgery   COQ10 PO Take 300 mg by mouth daily.   docusate sodium 100 MG capsule Commonly known as: Colace Take 1 capsule (100 mg total) by mouth daily as needed. To be taken after surgery   fluticasone 50 MCG/ACT nasal spray Commonly known as: FLONASE Place 2 sprays into both nostrils daily.   ibuprofen 200 MG tablet Commonly known as: ADVIL Take 200 mg by mouth every 6 (six) hours as needed for moderate pain.   methocarbamol 500 MG tablet Commonly known as: Robaxin Take 1 tablet (500 mg total) by mouth 2 (two) times daily as needed. To be taken after surgery   olmesartan 20 MG tablet Commonly known as: BENICAR Take 20 mg by mouth daily.   ondansetron 4 MG  tablet Commonly known as: Zofran Take 1 tablet (4 mg total) by mouth every 8 (eight) hours as needed for nausea or vomiting. To be taken after surgery   rosuvastatin 20 MG tablet Commonly known as: CRESTOR Take 20 mg by mouth daily.   Vitamin D 50 MCG (2000 UT) Caps Take 2,000 Units by mouth daily.        Diagnostic Studies: DG Chest 2 View  Result Date: 11/07/2020 CLINICAL DATA:  Preoperative evaluation. EXAM: CHEST - 2 VIEW COMPARISON:  07/23/2018 FINDINGS: The heart size and mediastinal contours are within normal limits. Both lungs are clear. The visualized skeletal structures are unremarkable. IMPRESSION: No active cardiopulmonary disease. Electronically Signed   By: Inez Catalina M.D.   On: 11/07/2020 02:36   MR Lumbar Spine W Wo Contrast  Result Date: 11/06/2020 CLINICAL DATA:  Lumbar radiculopathy. Chronic low back pain. Numbness in the left heel and right foot. Prior lumbar surgery. EXAM: MRI LUMBAR SPINE WITHOUT AND WITH CONTRAST TECHNIQUE: Multiplanar and multiecho pulse sequences of the lumbar spine were obtained without and with intravenous contrast. CONTRAST:  51m MULTIHANCE GADOBENATE DIMEGLUMINE 529 MG/ML IV SOLN COMPARISON:  12/11/2006 FINDINGS: Segmentation:  Standard. Alignment: Slight scoliosis. Chronic trace retrolisthesis of L2 on L3 and L3 on L4. Vertebrae: No acute fracture or suspicious marrow lesion. Degenerative endplate changes throughout the lumbar spine including scattered low level degenerative edema. Scattered small Schmorl's nodes. Conus medullaris and cauda equina: Conus extends to the lower L1 level and is normal in signal. Redundant appearance of the cauda equina nerve roots related to high-grade spinal stenosis. Paraspinal and other soft tissues: Postoperative changes in the posterior lumbar soft tissues. No fluid collection. Disc levels: Disc desiccation throughout the lumbar spine with progressive, severe disc space narrowing at L3-4, L4-5, and L5-S1 and  moderate narrowing at L1-2 and L2-3. T11-12: Only imaged sagittally. Small chronic right paracentral disc protrusion without stenosis, similar to the prior MRI. T12-L1: Mild facet and ligamentum flavum hypertrophy without disc herniation or stenosis. L1-2: Increased disc bulging and mild facet and ligamentum flavum hypertrophy without significant stenosis. L2-3: Retrolisthesis with bulging uncovered disc, a right paracentral and subarticular disc extrusion with slight caudal migration, and moderate facet and ligamentum flavum hypertrophy result in mild spinal stenosis, severe right and moderate left lateral recess stenosis, and mild right and mild-to-moderate left neural foraminal stenosis, progressed from prior. Potential bilateral L3 nerve root  impingement, particularly on the right. L3-4: Suspected interval microdiscectomy at this level with the right subarticular disc fragment on the prior MRI no longer seen. Circumferential disc osteophyte complex and moderate facet and ligamentum flavum hypertrophy have progressed and result in severe spinal stenosis and severe bilateral neural foraminal stenosis with potential bilateral L3 and L4 nerve root impingement. L4-5: Circumferential disc osteophyte complex eccentric to the right and severe right and moderate left facet and ligamentum flavum hypertrophy result in severe spinal stenosis and moderate to severe right and mild-to-moderate left neural foraminal stenosis, progressed from prior. Potential right L4 and bilateral L5 nerve root impingement. L5-S1: Remote left laminectomy. A caudally migrated central disc extrusion on the prior MRI has regressed. Circumferential disc bulging, endplate spurring, and moderate facet hypertrophy result in severe spinal stenosis, moderate to severe bilateral lateral recess stenosis, and severe left greater than right neural foraminal stenosis, overall mildly progressed. Potential bilateral L5 and S1 nerve root impingement.  IMPRESSION: 1. Severe widespread lumbar disc degeneration, progressed from 2008. 2. Severe spinal stenosis at L3-4, L4-5, and L5-S1. 3. Advanced neural foraminal stenosis bilaterally at L3-4 and L5-S1 and on the right at L4-5. Electronically Signed   By: Logan Bores M.D.   On: 11/06/2020 16:22   DG Pelvis Portable  Result Date: 11/15/2020 CLINICAL DATA:  Status post right hip arthroplasty. EXAM: PORTABLE PELVIS 1-2 VIEWS COMPARISON:  None. FINDINGS: Right hip arthroplasty in expected alignment. There is no periprosthetic lucency or fracture. Recent postsurgical change includes air and edema in the soft tissues and joint space. IMPRESSION: Right hip arthroplasty without immediate postoperative complication. Electronically Signed   By: Keith Rake M.D.   On: 11/15/2020 14:54   DG C-Arm 1-60 Min  Result Date: 11/15/2020 CLINICAL DATA:  Elective surgery Z41.9 (ICD-10-CM). Additional history provided: Right total hip arthroplasty anterior approach for right hip degenerative joint disease. Additional history provided: 27 seconds (3.9 mGy). EXAM: OPERATIVE right HIP (WITH PELVIS IF PERFORMED) 4 VIEWS TECHNIQUE: Fluoroscopic spot image(s) were submitted for interpretation post-operatively. COMPARISON:  Radiographs of the right hip 09/28/2020 (images available, report unavailable). FINDINGS: Four intraoperative fluoroscopic images of the right hip are submitted. On the provided images, there are findings of interval right total hip arthroplasty. The femoral and acetabular components appear well seated. No unexpected finding on the provided views. IMPRESSION: Four intraoperative fluoroscopic images of the right hip from right total hip arthroplasty. Electronically Signed   By: Kellie Simmering D.O.   On: 11/15/2020 12:50   DG HIP OPERATIVE UNILAT WITH PELVIS RIGHT  Result Date: 11/15/2020 CLINICAL DATA:  Elective surgery Z41.9 (ICD-10-CM). Additional history provided: Right total hip arthroplasty anterior  approach for right hip degenerative joint disease. Additional history provided: 27 seconds (3.9 mGy). EXAM: OPERATIVE right HIP (WITH PELVIS IF PERFORMED) 4 VIEWS TECHNIQUE: Fluoroscopic spot image(s) were submitted for interpretation post-operatively. COMPARISON:  Radiographs of the right hip 09/28/2020 (images available, report unavailable). FINDINGS: Four intraoperative fluoroscopic images of the right hip are submitted. On the provided images, there are findings of interval right total hip arthroplasty. The femoral and acetabular components appear well seated. No unexpected finding on the provided views. IMPRESSION: Four intraoperative fluoroscopic images of the right hip from right total hip arthroplasty. Electronically Signed   By: Kellie Simmering D.O.   On: 11/15/2020 12:50    Disposition: Discharge disposition: 01-Home or Self Care       Discharge Instructions     Call MD / Call 911   Complete by: As directed  If you experience chest pain or shortness of breath, CALL 911 and be transported to the hospital emergency room.  If you develope a fever above 101 F, pus (white drainage) or increased drainage or redness at the wound, or calf pain, call your surgeon's office.   Constipation Prevention   Complete by: As directed    Drink plenty of fluids.  Prune juice may be helpful.  You may use a stool softener, such as Colace (over the counter) 100 mg twice a day.  Use MiraLax (over the counter) for constipation as needed.   Diet - low sodium heart healthy   Complete by: As directed    Discharge instructions   Complete by: As directed    You may shower, dressing is waterproof.  Do not remove the dressing, we will remove it at your first post-op appointment.  Do not take a bath or soak the knee in a tub or pool.  You may weightbear as you can tolerate on the operative leg with a walker.   Do NOT put a pillow under your knee.  You will follow-up with Dr. Erlinda Hong in the clinic in 2 weeks at your given  appointment date.    INSTRUCTIONS AFTER JOINT REPLACEMENT   Remove items at home which could result in a fall. This includes throw rugs or furniture in walking pathways ICE to the affected joint every three hours while awake for 30 minutes at a time, for at least the first 3-5 days, and then as needed for pain and swelling.  Continue to use ice for pain and swelling. You may notice swelling that will progress down to the foot and ankle.  This is normal after surgery.  Elevate your leg when you are not up walking on it.   Continue to use the breathing machine you got in the hospital (incentive spirometer) which will help keep your temperature down.  It is common for your temperature to cycle up and down following surgery, especially at night when you are not up moving around and exerting yourself.  The breathing machine keeps your lungs expanded and your temperature down.   DIET:  As you were doing prior to hospitalization, we recommend a well-balanced diet.  DRESSING / WOUND CARE / SHOWERING  Keep the surgical dressing until follow up.  The dressing is water proof, so you can shower without any extra covering.  IF THE DRESSING FALLS OFF or the wound gets wet inside, change the dressing with sterile gauze.  Please use good hand washing techniques before changing the dressing.  Do not use any lotions or creams on the incision until instructed by your surgeon.    ACTIVITY  Increase activity slowly as tolerated, but follow the weight bearing instructions below.   No driving for 6 weeks or until further direction given by your physician.  You cannot drive while taking narcotics.  No lifting or carrying greater than 10 lbs. until further directed by your surgeon. Avoid periods of inactivity such as sitting longer than an hour when not asleep. This helps prevent blood clots.  You may return to work once you are authorized by your doctor.     WEIGHT BEARING   Weight bearing as tolerated with  assist device (walker, cane, etc) as directed, use it as long as suggested by your surgeon or therapist, typically at least 4-6 weeks.   EXERCISES  Results after joint replacement surgery are often greatly improved when you follow the exercise, range of motion and  muscle strengthening exercises prescribed by your doctor. Safety measures are also important to protect the joint from further injury. Any time any of these exercises cause you to have increased pain or swelling, decrease what you are doing until you are comfortable again and then slowly increase them. If you have problems or questions, call your caregiver or physical therapist for advice.   Rehabilitation is important following a joint replacement. After just a few days of immobilization, the muscles of the leg can become weakened and shrink (atrophy).  These exercises are designed to build up the tone and strength of the thigh and leg muscles and to improve motion. Often times heat used for twenty to thirty minutes before working out will loosen up your tissues and help with improving the range of motion but do not use heat for the first two weeks following surgery (sometimes heat can increase post-operative swelling).   These exercises can be done on a training (exercise) mat, on the floor, on a table or on a bed. Use whatever works the best and is most comfortable for you.    Use music or television while you are exercising so that the exercises are a pleasant break in your day. This will make your life better with the exercises acting as a break in your routine that you can look forward to.   Perform all exercises about fifteen times, three times per day or as directed.  You should exercise both the operative leg and the other leg as well.  Exercises include:   Quad Sets - Tighten up the muscle on the front of the thigh (Quad) and hold for 5-10 seconds.   Straight Leg Raises - With your knee straight (if you were given a brace, keep it  on), lift the leg to 60 degrees, hold for 3 seconds, and slowly lower the leg.  Perform this exercise against resistance later as your leg gets stronger.  Leg Slides: Lying on your back, slowly slide your foot toward your buttocks, bending your knee up off the floor (only go as far as is comfortable). Then slowly slide your foot back down until your leg is flat on the floor again.  Angel Wings: Lying on your back spread your legs to the side as far apart as you can without causing discomfort.  Hamstring Strength:  Lying on your back, push your heel against the floor with your leg straight by tightening up the muscles of your buttocks.  Repeat, but this time bend your knee to a comfortable angle, and push your heel against the floor.  You may put a pillow under the heel to make it more comfortable if necessary.   A rehabilitation program following joint replacement surgery can speed recovery and prevent re-injury in the future due to weakened muscles. Contact your doctor or a physical therapist for more information on knee rehabilitation.    CONSTIPATION  Constipation is defined medically as fewer than three stools per week and severe constipation as less than one stool per week.  Even if you have a regular bowel pattern at home, your normal regimen is likely to be disrupted due to multiple reasons following surgery.  Combination of anesthesia, postoperative narcotics, change in appetite and fluid intake all can affect your bowels.   YOU MUST use at least one of the following options; they are listed in order of increasing strength to get the job done.  They are all available over the counter, and you may need to use  some, POSSIBLY even all of these options:    Drink plenty of fluids (prune juice may be helpful) and high fiber foods Colace 100 mg by mouth twice a day  Senokot for constipation as directed and as needed Dulcolax (bisacodyl), take with full glass of water  Miralax (polyethylene glycol)  once or twice a day as needed.  If you have tried all these things and are unable to have a bowel movement in the first 3-4 days after surgery call either your surgeon or your primary doctor.    If you experience loose stools or diarrhea, hold the medications until you stool forms back up.  If your symptoms do not get better within 1 week or if they get worse, check with your doctor.  If you experience "the worst abdominal pain ever" or develop nausea or vomiting, please contact the office immediately for further recommendations for treatment.   ITCHING:  If you experience itching with your medications, try taking only a single pain pill, or even half a pain pill at a time.  You can also use Benadryl over the counter for itching or also to help with sleep.   TED HOSE STOCKINGS:  Use stockings on both legs until for at least 2 weeks or as directed by physician office. They may be removed at night for sleeping.  MEDICATIONS:  See your medication summary on the "After Visit Summary" that nursing will review with you.  You may have some home medications which will be placed on hold until you complete the course of blood thinner medication.  It is important for you to complete the blood thinner medication as prescribed.  PRECAUTIONS:  If you experience chest pain or shortness of breath - call 911 immediately for transfer to the hospital emergency department.   If you develop a fever greater that 101 F, purulent drainage from wound, increased redness or drainage from wound, foul odor from the wound/dressing, or calf pain - CONTACT YOUR SURGEON.                                                   FOLLOW-UP APPOINTMENTS:  If you do not already have a post-op appointment, please call the office for an appointment to be seen by your surgeon.  Guidelines for how soon to be seen are listed in your "After Visit Summary", but are typically between 1-4 weeks after surgery.  OTHER INSTRUCTIONS:   Knee  Replacement:  Do not place pillow under knee, focus on keeping the knee straight while resting. CPM instructions: 0-90 degrees, 2 hours in the morning, 2 hours in the afternoon, and 2 hours in the evening. Place foam block, curve side up under heel at all times except when in CPM or when walking.  DO NOT modify, tear, cut, or change the foam block in any way.  POST-OPERATIVE OPIOID TAPER INSTRUCTIONS: It is important to wean off of your opioid medication as soon as possible. If you do not need pain medication after your surgery it is ok to stop day one. Opioids include: Codeine, Hydrocodone(Norco, Vicodin), Oxycodone(Percocet, oxycontin) and hydromorphone amongst others.  Long term and even short term use of opiods can cause: Increased pain response Dependence Constipation Depression Respiratory depression And more.  Withdrawal symptoms can include Flu like symptoms Nausea, vomiting And more Techniques to manage these symptoms Hydrate well  Eat regular healthy meals Stay active Use relaxation techniques(deep breathing, meditating, yoga) Do Not substitute Alcohol to help with tapering If you have been on opioids for less than two weeks and do not have pain than it is ok to stop all together.  Plan to wean off of opioids This plan should start within one week post op of your joint replacement. Maintain the same interval or time between taking each dose and first decrease the dose.  Cut the total daily intake of opioids by one tablet each day Next start to increase the time between doses. The last dose that should be eliminated is the evening dose.   MAKE SURE YOU:  Understand these instructions.  Get help right away if you are not doing well or get worse.    Thank you for letting us be a part of your medical care team.  It is a privilege we respect greatly.  We hope these instructions will help you stay on track for a fast and full recovery!    Dental Antibiotics:  In most cases  prophylactic antibiotics for Dental procdeures after total joint surgery are not necessary.  Exceptions are as follows:  1. History of prior total joint infection  2. Severely immunocompromised (Organ Transplant, cancer chemotherapy, Rheumatoid biologic meds such as [place])  3. Poorly controlled diabetes (A1C &gt; 8.0, blood glucose over 200)  If you have one of these conditions, contact your surgeon for an antibiotic prescription, prior to your dental procedure.   Increase activity slowly as tolerated   Complete by: As directed    Post-operative opioid taper instructions:   Complete by: As directed    POST-OPERATIVE OPIOID TAPER INSTRUCTIONS: It is important to wean off of your opioid medication as soon as possible. If you do not need pain medication after your surgery it is ok to stop day one. Opioids include: Codeine, Hydrocodone(Norco, Vicodin), Oxycodone(Percocet, oxycontin) and hydromorphone amongst others.  Long term and even short term use of opiods can cause: Increased pain response Dependence Constipation Depression Respiratory depression And more.  Withdrawal symptoms can include Flu like symptoms Nausea, vomiting And more Techniques to manage these symptoms Hydrate well Eat regular healthy meals Stay active Use relaxation techniques(deep breathing, meditating, yoga) Do Not substitute Alcohol to help with tapering If you have been on opioids for less than two weeks and do not have pain than it is ok to stop all together.  Plan to wean off of opioids This plan should start within one week post op of your joint replacement. Maintain the same interval or time between taking each dose and first decrease the dose.  Cut the total daily intake of opioids by one tablet each day Next start to increase the time between doses. The last dose that should be eliminated is the evening dose.           Follow-up Information     Leandrew Koyanagi, MD. Go on 11/30/2020.    Specialty: Orthopedic Surgery Why: at 1:45 pm For suture removal, For wound re-check Contact information: [place] [id number] [phone]                  Signed: Donella Stade 11/24/2020, 11:45 PM

## 2020-11-24 NOTE — Telephone Encounter (Signed)
I refilled. Please send med refills to [facility].

## 2020-11-24 NOTE — Telephone Encounter (Signed)
Would like a RF on Oxycodone. Would like it sent to [facility] [address].

## 2020-11-26 ENCOUNTER — Telehealth: Payer: Self-pay | Admitting: *Deleted

## 2020-11-26 ENCOUNTER — Other Ambulatory Visit: Payer: Self-pay | Admitting: Physician Assistant

## 2020-11-26 DIAGNOSIS — Z7982 Long term (current) use of aspirin: Secondary | ICD-10-CM | POA: Diagnosis not present

## 2020-11-26 DIAGNOSIS — Z471 Aftercare following joint replacement surgery: Secondary | ICD-10-CM | POA: Diagnosis not present

## 2020-11-26 DIAGNOSIS — Z96641 Presence of right artificial hip joint: Secondary | ICD-10-CM | POA: Diagnosis not present

## 2020-11-26 DIAGNOSIS — Z85828 Personal history of other malignant neoplasm of skin: Secondary | ICD-10-CM | POA: Diagnosis not present

## 2020-11-26 DIAGNOSIS — M545 Low back pain, unspecified: Secondary | ICD-10-CM | POA: Diagnosis not present

## 2020-11-26 DIAGNOSIS — G8929 Other chronic pain: Secondary | ICD-10-CM | POA: Diagnosis not present

## 2020-11-26 DIAGNOSIS — I10 Essential (primary) hypertension: Secondary | ICD-10-CM | POA: Diagnosis not present

## 2020-11-26 DIAGNOSIS — K219 Gastro-esophageal reflux disease without esophagitis: Secondary | ICD-10-CM | POA: Diagnosis not present

## 2020-11-26 DIAGNOSIS — Z87891 Personal history of nicotine dependence: Secondary | ICD-10-CM | POA: Diagnosis not present

## 2020-11-26 MED ORDER — OXYCODONE-ACETAMINOPHEN 5-325 MG PO TABS: 1.0000 | ORAL_TABLET | Freq: Two times a day (BID) | ORAL | 0 refills | Status: DC | PRN

## 2020-11-26 NOTE — Telephone Encounter (Signed)
Ortho bundle 7 day call completed. 

## 2020-11-26 NOTE — Telephone Encounter (Signed)
Sent in

## 2020-11-26 NOTE — Telephone Encounter (Signed)
Pt states that the pharmacy does not have his prescription for oxycodone. He would like it sent in before the weekend please.  CB [phone]

## 2020-11-29 DIAGNOSIS — G8929 Other chronic pain: Secondary | ICD-10-CM | POA: Diagnosis not present

## 2020-11-29 DIAGNOSIS — Z471 Aftercare following joint replacement surgery: Secondary | ICD-10-CM | POA: Diagnosis not present

## 2020-11-29 DIAGNOSIS — I10 Essential (primary) hypertension: Secondary | ICD-10-CM | POA: Diagnosis not present

## 2020-11-29 DIAGNOSIS — Z7982 Long term (current) use of aspirin: Secondary | ICD-10-CM | POA: Diagnosis not present

## 2020-11-29 DIAGNOSIS — M545 Low back pain, unspecified: Secondary | ICD-10-CM | POA: Diagnosis not present

## 2020-11-29 DIAGNOSIS — Z85828 Personal history of other malignant neoplasm of skin: Secondary | ICD-10-CM | POA: Diagnosis not present

## 2020-11-29 DIAGNOSIS — Z87891 Personal history of nicotine dependence: Secondary | ICD-10-CM | POA: Diagnosis not present

## 2020-11-29 DIAGNOSIS — Z96641 Presence of right artificial hip joint: Secondary | ICD-10-CM | POA: Diagnosis not present

## 2020-11-29 DIAGNOSIS — K219 Gastro-esophageal reflux disease without esophagitis: Secondary | ICD-10-CM | POA: Diagnosis not present

## 2020-11-30 ENCOUNTER — Encounter: Payer: Self-pay | Admitting: Orthopaedic Surgery

## 2020-11-30 ENCOUNTER — Other Ambulatory Visit: Payer: Self-pay

## 2020-11-30 ENCOUNTER — Ambulatory Visit (INDEPENDENT_AMBULATORY_CARE_PROVIDER_SITE_OTHER): Payer: Medicare PPO | Admitting: Physician Assistant

## 2020-11-30 ENCOUNTER — Telehealth: Payer: Self-pay | Admitting: *Deleted

## 2020-11-30 DIAGNOSIS — Z96641 Presence of right artificial hip joint: Secondary | ICD-10-CM

## 2020-11-30 NOTE — Progress Notes (Signed)
Post-Op Visit Note   Patient: Adrian [name]           Date of Birth: [birth year]           MRN: [id number] Visit Date: 11/30/2020 PCP: Ginger Organ., MD   Assessment & Plan:  Chief Complaint:  Chief Complaint  Patient presents with   Right Hip - Post-op Follow-up   Visit Diagnoses:  1. History of hip replacement, total, right     Plan: Patient is a pleasant 70 year old gentleman who comes in today 2 weeks out right anterior total hip replacement 11/15/2020.  He has been doing well.  He has been in some pain which is relieved with narcotic pain medication.  He recently finished home health physical therapy where he is still ambulating with a walker to help with his gait.  He is doing well overall.  Examination of the right hip reveals a well-healing surgical incision.  No evidence of infection or cellulitis.  Calves are soft nontender.  He is neurovascular intact distally.  Today, sutures were removed and Steri-Strips applied.  He will continue with his home exercise program.  Follow-up with [place] in 4 weeks time for repeat evaluation AP pelvis x-rays.  Dental prophylaxis reinforced.  Call with concerns or questions.  Follow-Up Instructions: Return in about 4 weeks (around 12/28/2020).   Orders:  No orders of the defined types were placed in this encounter.  No orders of the defined types were placed in this encounter.   Imaging: No new imaging  PMFS History: Patient Active Problem List   Diagnosis Date Noted   Primary osteoarthritis of right hip 11/15/2020   Status post total replacement of right hip 11/15/2020   Vertigo    Coronary artery disease due to calcified coronary lesion 07/02/2018   Essential hypertension 07/02/2018   Pure hypercholesterolemia 07/02/2018   Past Medical History:  Diagnosis Date   Arthritis    Atypical nevus 04/20/2004   left low back (wider shave)   Basal cell carcinoma 01/16/1995   left back (cx3 47f exc)   BCC (basal cell carcinoma of  skin) 10/13/2002   left supra brow (cx3 fu exc)   BCC (basal cell carcinoma of skin) 04/20/2004   center upper back (cx3 5379f   BCC (basal cell carcinoma of skin) 04/24/2006   upper mid back (cx3 79f41f  BCC (basal cell carcinoma of skin) 10/28/2012   left upper back (cx3 79fu64f BCC (basal cell carcinoma of skin) 10/28/2012   right back upper (cx3 79fu)79fBCC (basal cell carcinoma of skin) 10/28/2012   right deltoid (cx3 79fu) 59fCC (basal cell carcinoma of skin) 10/25/2015   right chest no treatment basaliod neoplasm   DJD (degenerative joint disease)    GERD (gastroesophageal reflux disease)    Hydrocele    Hypertension    Right hydrocele 2021   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 No pertinent family history.  Past Surgical History:  Procedure Laterality Date   APPENDECTOMY     hemorrhaged post [place]   [place]   DIAGNOSTIC LAPAROSCOPY     HERNIA REPAIR     umbilical   HYDROCELE EXCISION Right 07/14/2019   Procedure: HYDROCELECTOMY ADULT;  Surgeon: OttelinKathie Rhodes[facility];  Laterality: Right;  KNEE ARTHROSCOPY  04/03/2008   lt   LUMBAR LAMINECTOMY  04/03/2006   SEPTOPLASTY     TONSILLECTOMY Bilateral 1970   TOTAL HIP ARTHROPLASTY Right 11/15/2020   Procedure: RIGHT TOTAL HIP ARTHROPLASTY ANTERIOR APPROACH;  Surgeon: Leandrew Koyanagi, MD;  Location: [place];  Service: Orthopedics;  Laterality: Right;  3-C   Social History   Occupational History   Not on file  Tobacco Use   Smoking status: Former    Types: Cigarettes    Start date: 04/03/1972    Quit date: 04/03/1973    Years since quitting: 47.6   Smokeless tobacco: Never  Vaping Use   Vaping Use: Never used  Substance and Sexual Activity   Alcohol use: Not Currently   Drug use: No   Sexual activity: Yes

## 2020-11-30 NOTE — Telephone Encounter (Signed)
Ortho bundle 14 day call completed. 

## 2020-12-10 ENCOUNTER — Telehealth: Payer: Self-pay | Admitting: *Deleted

## 2020-12-10 MED ORDER — METHOCARBAMOL 500 MG PO TABS: 500.0000 mg | ORAL_TABLET | Freq: Two times a day (BID) | ORAL | 6 refills | Status: DC | PRN

## 2020-12-10 NOTE — Telephone Encounter (Signed)
Done

## 2020-12-10 NOTE — Telephone Encounter (Signed)
Patient called requesting refill of Methocarbamol. Thanks.

## 2020-12-10 NOTE — Telephone Encounter (Signed)
Call to patient and updated that medication has been called into his pharmacy.

## 2020-12-17 ENCOUNTER — Other Ambulatory Visit: Payer: Self-pay | Admitting: Physician Assistant

## 2020-12-30 ENCOUNTER — Encounter: Payer: Self-pay | Admitting: Orthopaedic Surgery

## 2020-12-30 ENCOUNTER — Ambulatory Visit: Payer: Self-pay

## 2020-12-30 ENCOUNTER — Other Ambulatory Visit: Payer: Self-pay

## 2020-12-30 ENCOUNTER — Ambulatory Visit (INDEPENDENT_AMBULATORY_CARE_PROVIDER_SITE_OTHER): Payer: Medicare PPO | Admitting: Orthopaedic Surgery

## 2020-12-30 VITALS — Ht 70.5 in | Wt 191.0 lb

## 2020-12-30 DIAGNOSIS — Z96641 Presence of right artificial hip joint: Secondary | ICD-10-CM

## 2020-12-30 NOTE — Progress Notes (Signed)
Post-Op Visit Note   Patient: Adrian [name]           Date of Birth: June 19, 1950           MRN: [id number] Visit Date: 12/30/2020 PCP: Adrian [name]., MD   Assessment & Plan:  Chief Complaint:  Chief Complaint  Patient presents with   Right Hip - Follow-up    Right total hip arthroplasty 11/15/2020   Visit Diagnoses:  1. History of hip replacement, total, right     Plan: Adrian [name] is a 6-week status post right total hip replacement.  No real complaints other than soreness when he sleeps on the right side.  Not taking any prescription pain medications  Right hip scar is healed.  He has mild swelling.  No signs of infection.  Slight antalgic gait.  X-rays demonstrate stable implant without complications.  [name] is doing very well with his recovery.  All of his questions were answered today.  Dental prophylaxis reinforced.  Recheck in 6 weeks.  Follow-Up Instructions: Return in about 6 weeks (around 02/10/2021).   Orders:  Orders Placed This Encounter  Procedures   XR Pelvis 1-2 Views   Ambulatory referral to Physical Therapy   No orders of the defined types were placed in this encounter.   Imaging: XR Pelvis 1-2 Views  Result Date: 12/30/2020 Stable total hip replacement without complications   PMFS History: Patient Active Problem List   Diagnosis Date Noted   Primary osteoarthritis of right hip 11/15/2020   Status post total replacement of right hip 11/15/2020   Vertigo    Coronary artery disease due to calcified coronary lesion 07/02/2018   Essential hypertension 07/02/2018   Pure hypercholesterolemia 07/02/2018   Past Medical History:  Diagnosis Date   Arthritis    Atypical nevus 04/20/2004   left low back (wider shave)   Basal cell carcinoma 01/16/1995   left back (cx3 49fu exc)   BCC (basal cell carcinoma of skin) 10/13/2002   left supra brow (cx3 fu exc)   BCC (basal cell carcinoma of skin) 04/20/2004   center upper back (cx3 38fu)   BCC (basal cell  carcinoma of skin) 04/24/2006   upper mid back (cx3 28fu)   BCC (basal cell carcinoma of skin) 10/28/2012   left upper back (cx3 22fu)   BCC (basal cell carcinoma of skin) 10/28/2012   right back upper (cx3 75fu)   BCC (basal cell carcinoma of skin) 10/28/2012   right deltoid (cx3 32fu)   BCC (basal cell carcinoma of skin) 10/25/2015   right chest no treatment basaliod neoplasm   DJD (degenerative joint disease)    GERD (gastroesophageal reflux disease)    Hydrocele    Hypertension    Right hydrocele 2021   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  Past Surgical History:  Procedure Laterality Date   APPENDECTOMY     hemorrhaged post op   [place] SURGERY  04/03/1993   DIAGNOSTIC LAPAROSCOPY     HERNIA REPAIR     umbilical   HYDROCELE EXCISION Right 07/14/2019   Procedure: HYDROCELECTOMY ADULT;  Surgeon: Kathie Rhodes, MD;  [facility];  Service: Urology;  Laterality: Right;   KNEE ARTHROSCOPY  04/03/2008   lt   LUMBAR LAMINECTOMY  04/03/2006   SEPTOPLASTY     TONSILLECTOMY Bilateral 1970  TOTAL HIP ARTHROPLASTY Right 11/15/2020   Procedure: RIGHT TOTAL HIP ARTHROPLASTY ANTERIOR APPROACH;  Surgeon: Leandrew Koyanagi, MD;  Location: [place];  Service: Orthopedics;  Laterality: Right;  3-C   Social History   Occupational History   Not on file  Tobacco Use   Smoking status: Former    Types: Cigarettes    Start date: 04/03/1972    Quit date: 04/03/1973    Years since quitting: 47.7   Smokeless tobacco: Never  Vaping Use   Vaping Use: Never used  Substance and Sexual Activity   Alcohol use: Not Currently   Drug use: No   Sexual activity: Yes

## 2021-01-04 ENCOUNTER — Other Ambulatory Visit: Payer: Self-pay

## 2021-01-04 ENCOUNTER — Ambulatory Visit: Payer: Medicare PPO | Admitting: Rehabilitative and Restorative Service Providers"

## 2021-01-04 ENCOUNTER — Encounter: Payer: Self-pay | Admitting: Rehabilitative and Restorative Service Providers"

## 2021-01-04 DIAGNOSIS — M25551 Pain in right hip: Secondary | ICD-10-CM

## 2021-01-04 DIAGNOSIS — M6281 Muscle weakness (generalized): Secondary | ICD-10-CM

## 2021-01-04 DIAGNOSIS — R262 Difficulty in walking, not elsewhere classified: Secondary | ICD-10-CM | POA: Diagnosis not present

## 2021-01-04 NOTE — Therapy (Signed)
[place], [place], [address] Phone: [phone]   Fax:  [phone]  Physical Therapy Evaluation  Patient Details  Name: KAYDENN MCLEAR MRN: [id number] Date of Birth: 11-02-1950 Referring Provider (PT): Leandrew Koyanagi, MD   Encounter Date: 01/04/2021   PT End of Session - 01/04/21 1108     Visit Number 1    Number of Visits 20    Date for PT Re-Evaluation 03/15/21    Authorization Type [employer]    PT Start Time 7564    PT Stop Time 1053    PT Time Calculation (min) 38 min    Activity Tolerance Patient tolerated treatment well    Behavior During Therapy [facility] for tasks assessed/performed             Past Medical History:  Diagnosis Date   Arthritis    Atypical nevus 04/20/2004   left low back (wider shave)   Basal cell carcinoma 01/16/1995   left back (cx3 21fu exc)   BCC (basal cell carcinoma of skin) 10/13/2002   left supra brow (cx3 fu exc)   BCC (basal cell carcinoma of skin) 04/20/2004   center upper back (cx3 35fu)   BCC (basal cell carcinoma of skin) 04/24/2006   upper mid back (cx3 77fu)   BCC (basal cell carcinoma of skin) 10/28/2012   left upper back (cx3 76fu)   BCC (basal cell carcinoma of skin) 10/28/2012   right back upper (cx3 58fu)   BCC (basal cell carcinoma of skin) 10/28/2012   right deltoid (cx3 72fu)   BCC (basal cell carcinoma of skin) 10/25/2015   right chest no treatment basaliod neoplasm   DJD (degenerative joint disease)    GERD (gastroesophageal reflux disease)    Hydrocele    Hypertension    Right hydrocele 2021   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   left temple (cx3 33fu)   Seasonal allergies     Past Surgical History:  Procedure Laterality Date   APPENDECTOMY     hemorrhaged post op   [place] SURGERY  04/03/1993   DIAGNOSTIC LAPAROSCOPY     HERNIA REPAIR     umbilical   HYDROCELE EXCISION Right 07/14/2019   Procedure: HYDROCELECTOMY  ADULT;  Surgeon: Kathie Rhodes, MD;  Location: [place];  Service: Urology;  Laterality: Right;   KNEE ARTHROSCOPY  04/03/2008   lt   LUMBAR LAMINECTOMY  04/03/2006   SEPTOPLASTY     TONSILLECTOMY Bilateral 1970   TOTAL HIP ARTHROPLASTY Right 70/15/2022   Procedure: RIGHT TOTAL HIP ARTHROPLASTY ANTERIOR APPROACH;  Surgeon: Leandrew Koyanagi, MD;  Location: [place];  Service: Orthopedics;  Laterality: Right;  3-C    There were no vitals filed for this visit.    Subjective Assessment - 01/04/21 1016     Subjective Pt. has history of Rt hip replacement Nov 15, 2020.  Pt. stated he was just about painfree, has some pain in early morning and lying on Rt side, getting sore.  Pt. stated taking OTC medicine in morning to help symptoms.  Pt. indicated he was limited for walking for about a year prior to surgery.  Pt. indicated he usually does yard work.  Pt. indicated he wants to restrengthen hip and legs muscles.  Pt. indicated treatment at different clinic physical therapy prior to surgery (early 2022 visits).  Pt. stated he can stand and do activity at home for about 5 hours.  Pertinent History History of L4, L5 surgery with after effects changing walking pattern, history of basal cell cancers, HTN, GERD    Limitations Standing;Walking;Other (comment)   lying on rt side at times   Patient Stated Goals Exercises to get stronger    Currently in Pain? Yes    Pain Score 5     Pain Location Hip    Pain Orientation Right    Pain Descriptors / Indicators Sore    Pain Type Chronic pain;Surgical pain    Pain Onset More than a month ago    Pain Frequency Intermittent    Aggravating Factors  lying on Rt side, getting shoe on Rt foot    Pain Relieving Factors OTC medicine                [facility] PT Assessment - 01/04/21 0001       Assessment   Medical Diagnosis Z96.641 (ICD-10-CM) - History of hip replacement, total, right    Referring Provider (PT) Leandrew Koyanagi, MD    Onset  Date/Surgical Date 11/15/20    Hand Dominance Left      Precautions   Precautions None      Balance Screen   Has the patient fallen in the past 6 months Yes    How many times? 2   once by dog, once in church   Has the patient had a decrease in activity level because of a fear of falling?  No    Is the patient reluctant to leave their home because of a fear of falling?  No      Home Environment   Additional Comments flight of stairs c rail to bedroom      Prior Function   Leisure [place] work, walking for exercise      Observation/Other Assessments   Focus on Therapeutic Outcomes (FOTO)  intake 59%, predicted 65%      Functional Tests   Functional tests Sit to Stand;Single leg stance      Sit to Stand   Comments 18 inch chair no UE on 1 rep      ROM / Strength   AROM / PROM / Strength Strength;PROM;AROM      AROM   Overall AROM Comments General limitation in [place]AB[place], ER of Rt hip in various positioning vs. Lt    AROM Assessment Site Hip    Right/Left Hip Left;Right      Strength   Strength Assessment Site Hip;Knee;Ankle    Right/Left Hip Left;Right    Right Hip Flexion 4/5    Right Hip ABduction 3+/5    Left Hip Flexion 5/5    Right/Left Knee Right;Left    Right Knee Flexion 5/5    Right Knee Extension 4/5    Left Knee Flexion 5/5    Left Knee Extension 5/5      Ambulation/Gait   Ambulation/Gait Yes    Ambulation/Gait Assistance 7: Independent    Gait Pattern Decreased stance time - right;Decreased hip/knee flexion - right;Antalgic;Trendelenburg                        Objective measurements completed on examination: See above findings.       [place] Adult PT Treatment/Exercise - 01/04/21 0001       Self-Care   Self-Care Other Self-Care Comments    Other Self-Care Comments  Self care education regarding return to gym based protocol and advice to focus more on lower weight and higher reps and  progressing slowly in return to activity to avoid DOMS.   Reviewed use of HEP provided today for daily routine.      Exercises   Exercises Other Exercises;Knee/Hip    Other Exercises  HEP instruction/performance c cues for techniques, handout provided.  Trial set performed of each for comprehension and symptom assessment.  Consisting of supine bridge, piriformis stretch supine, sidelying hip abduction SLR, supine SLR, seated SLR/quad set, sit to stand, standing doorway hip abduction hold                     PT Education - 01/04/21 1011     Education Details HEP, POC    Person(s) Educated Patient    Methods Explanation;Demonstration;Verbal cues;Handout    Comprehension Returned demonstration;Verbalized understanding              PT Short Term Goals - 01/04/21 1108       PT SHORT TERM GOAL #1   Title Patient will demonstrate independent use of home exercise program to maintain progress from in clinic treatments.    Time 3    Period Weeks    Status New    Target Date 01/25/21               PT Long Term Goals - 01/04/21 1106       PT LONG TERM GOAL #1   Title Patient will demonstrate/report pain at worst less than or equal to 2/10 to facilitate minimal limitation in daily activity secondary to pain symptoms.    Time 10    Period Weeks    Status New    Target Date 03/15/21      PT LONG TERM GOAL #2   Title Patient will demonstrate independent use of home exercise program to facilitate ability to maintain/progress functional gains from skilled physical therapy services.    Time 10    Period Weeks    Status New    Target Date 03/15/21      PT LONG TERM GOAL #3   Title Pt. will demonstrate FOTO outcome > or = 65 % to indicated reduced disability due to condition.    Time 10    Period Weeks    Status New    Target Date 03/15/21      PT LONG TERM GOAL #4   Title Pt. will demonstrate Rt knee MMT 5/5 throughout, Rt hip flexion 5/5, Rt hip abduction 4/5 to facilitate stairs, transfers and yard work at [employer].     Time 10    Period Weeks    Status New    Target Date 03/15/21      PT LONG TERM GOAL #5   Title Pt. will demonstrate ability to don/doff Rt shoe s assistance.    Time 10    Period Weeks    Status New    Target Date 03/15/21                    Plan - 01/04/21 1010     Clinical Impression Statement Patient is a 70 y.o. who comes to clinic with complaints of Rt hip pain with mobility, strength and movement coordination deficits that impair their ability to perform usual daily and recreational functional activities without increase difficulty/symptoms at this time.  Patient to benefit from skilled PT services to address impairments and limitations to improve to previous level of function without restriction secondary to condition.    Personal Factors and Comorbidities Comorbidity 3+  Comorbidities History of basal cell cancers, HTN, GERD    Examination-Activity Limitations Sleep;Stairs;Stand;Transfers;Locomotion Level    Examination-Participation Restrictions Community Activity;Yard Work    Stability/Clinical Decision Making Stable/Uncomplicated    Designer, jewellery Low    Rehab Potential Good    PT Frequency Other (comment)   1-2x/week   PT Duration Other (comment)   10 weeks   PT Treatment/Interventions ADLs/Self Care Home Management;Cryotherapy;Electrical Stimulation;Iontophoresis 4mg /ml Dexamethasone;Moist Heat;Balance training;Therapeutic exercise;Therapeutic activities;Functional mobility training;Stair training;Gait training;DME Instruction;Ultrasound;Neuromuscular re-education;Patient/family education;Passive range of motion;Spinal Manipulations;Joint Manipulations;Dry needling;Taping;Manual techniques    PT Next Visit Plan Continued Rt hip strengthening, progressive activity return in gym based activity.    PT Home Exercise Plan [email or web address]    Consulted and Agree with Plan of Care Patient             Patient will benefit from skilled therapeutic intervention  in order to improve the following deficits and impairments:  Abnormal gait, Pain, Decreased strength, Decreased activity tolerance, Difficulty walking, Decreased mobility, Decreased balance, Decreased range of motion, Impaired perceived functional ability, Impaired flexibility, Decreased coordination  Visit Diagnosis: Pain in right hip  Muscle weakness (generalized)  Difficulty in walking, not elsewhere classified     Problem List Patient Active Problem List   Diagnosis Date Noted   Primary osteoarthritis of right hip 11/15/2020   Status post total replacement of right hip 11/15/2020   Vertigo    Coronary artery disease due to calcified coronary lesion 07/02/2018   Essential hypertension 07/02/2018   Pure hypercholesterolemia 07/02/2018    Referring diagnosis? E23.361 Treatment diagnosis? (if different than referring diagnosis) M25.551 What was this (referring dx) caused by? [x]  Surgery []  Fall []  Ongoing issue []  Arthritis []  Other: ____________  Laterality: [x]  Rt []  Lt []  Both  Check all possible CPT codes:      [x]  97110 (Therapeutic Exercise)  []  92507 (SLP Treatment)  [x]  97112 (Neuro Re-ed)   []  92526 (Swallowing Treatment)   [x]  97116 (Gait Training)   []  [id number] (Cognitive Training, 1st 15 minutes) [x]  97140 (Manual Therapy)   []  97130 (Cognitive Training, each add'l 15 minutes)  [x]  97530 (Therapeutic Activities)  []  Other, List CPT Code ____________    [x]  22449 (Self Care)       [x]  All codes above (97110 - 97535)  []  97012 (Mechanical Traction)  []  97014 (E-stim Unattended)  [x]  97032 (E-stim manual)  []  97033 (Ionto)  []  97035 (Ultrasound)  []  75300 (Orthotic Fit) [x]  97750 (Physical Performance Training) []  [id number] (Aquatic Therapy) []  97034 (Contrast Bath) []  [id number] (Paraffin) []  97597 (Wound Care 1st 20 sq cm) []  97598 (Wound Care each add'l 20 sq cm) []  97016 (Vasopneumatic Device) []  [id number] (Orthotic Training) []  [id number] (Prosthetic  Training)  Scot Jun, PT, DPT, OCS, ATC 01/04/21  11:27 AM    [facility] [address] [place], [place], [address] Phone: [phone]   Fax:  [phone]  Name: GREIG ALTERGOTT MRN: [id number] Date of Birth: 1950/06/24

## 2021-01-04 NOTE — Patient Instructions (Signed)
Access Code: [email or web address] URL: [email or web address] Date: 01/04/2021 Prepared by: Scot Jun  Exercises Supine Bridge - 2 x daily - 7 x weekly - 3 sets - 10 reps - 2 hold Sit to Stand - 1 x daily - 7 x weekly - 3 sets - 10 reps Small Range Straight Leg Raise (Mirrored) - 2 x daily - 7 x weekly - 3 sets - 10 reps Seated Straight Leg Heel Taps - 1 x daily - 7 x weekly - 3 sets - 10 reps Supine Piriformis Stretch Pulling Heel to Hip - 2-3 x daily - 7 x weekly - 1 sets - 5 reps - 20-30 hold

## 2021-01-11 DIAGNOSIS — R7301 Impaired fasting glucose: Secondary | ICD-10-CM | POA: Diagnosis not present

## 2021-01-11 DIAGNOSIS — R946 Abnormal results of thyroid function studies: Secondary | ICD-10-CM | POA: Diagnosis not present

## 2021-01-11 DIAGNOSIS — I10 Essential (primary) hypertension: Secondary | ICD-10-CM | POA: Diagnosis not present

## 2021-01-11 DIAGNOSIS — E785 Hyperlipidemia, unspecified: Secondary | ICD-10-CM | POA: Diagnosis not present

## 2021-01-17 DIAGNOSIS — Z125 Encounter for screening for malignant neoplasm of prostate: Secondary | ICD-10-CM | POA: Diagnosis not present

## 2021-01-18 DIAGNOSIS — I10 Essential (primary) hypertension: Secondary | ICD-10-CM | POA: Diagnosis not present

## 2021-01-18 DIAGNOSIS — R7301 Impaired fasting glucose: Secondary | ICD-10-CM | POA: Diagnosis not present

## 2021-01-18 DIAGNOSIS — Z0000 Encounter for general adult medical examination without abnormal findings: Secondary | ICD-10-CM | POA: Diagnosis not present

## 2021-01-18 DIAGNOSIS — I2510 Atherosclerotic heart disease of native coronary artery without angina pectoris: Secondary | ICD-10-CM | POA: Diagnosis not present

## 2021-01-18 DIAGNOSIS — R946 Abnormal results of thyroid function studies: Secondary | ICD-10-CM | POA: Diagnosis not present

## 2021-01-18 DIAGNOSIS — M5416 Radiculopathy, lumbar region: Secondary | ICD-10-CM | POA: Diagnosis not present

## 2021-01-18 DIAGNOSIS — E785 Hyperlipidemia, unspecified: Secondary | ICD-10-CM | POA: Diagnosis not present

## 2021-01-18 DIAGNOSIS — M542 Cervicalgia: Secondary | ICD-10-CM | POA: Diagnosis not present

## 2021-01-18 DIAGNOSIS — R82998 Other abnormal findings in urine: Secondary | ICD-10-CM | POA: Diagnosis not present

## 2021-01-18 DIAGNOSIS — Z23 Encounter for immunization: Secondary | ICD-10-CM | POA: Diagnosis not present

## 2021-01-25 ENCOUNTER — Encounter: Payer: Self-pay | Admitting: Rehabilitative and Restorative Service Providers"

## 2021-01-25 ENCOUNTER — Ambulatory Visit: Payer: Medicare PPO | Admitting: Rehabilitative and Restorative Service Providers"

## 2021-01-25 ENCOUNTER — Other Ambulatory Visit: Payer: Self-pay

## 2021-01-25 DIAGNOSIS — R262 Difficulty in walking, not elsewhere classified: Secondary | ICD-10-CM | POA: Diagnosis not present

## 2021-01-25 DIAGNOSIS — M6281 Muscle weakness (generalized): Secondary | ICD-10-CM | POA: Diagnosis not present

## 2021-01-25 DIAGNOSIS — M25551 Pain in right hip: Secondary | ICD-10-CM | POA: Diagnosis not present

## 2021-01-25 NOTE — Therapy (Signed)
[facility] [address] [place], [place], [address] Phone: [phone]   Fax:  [phone]  Physical Therapy Treatment  Patient Details  Name: Adrian [name] MRN: [id number] Date of Birth: Mar 11, 1951 Referring Provider (PT): Leandrew Koyanagi, MD   Encounter Date: 01/25/2021   PT End of Session - 01/25/21 0932     Visit Number 2    Number of Visits 20    Date for PT Re-Evaluation 03/15/21    Authorization Type [employer] $20 copay.  12 visits 01/04/2021 - 03/06/2021    Authorization - Visit Number 2    Authorization - Number of Visits 12    PT Start Time 1572    PT Stop Time 1009    PT Time Calculation (min) 38 min    Activity Tolerance Patient tolerated treatment well    Behavior During Therapy [facility] for tasks assessed/performed             Past Medical History:  Diagnosis Date   Arthritis    Atypical nevus 04/20/2004   left low back (wider shave)   Basal cell carcinoma 01/16/1995   left back (cx3 60fu exc)   BCC (basal cell carcinoma of skin) 10/13/2002   left supra brow (cx3 fu exc)   BCC (basal cell carcinoma of skin) 04/20/2004   center upper back (cx3 63fu)   BCC (basal cell carcinoma of skin) 04/24/2006   upper mid back (cx3 54fu)   BCC (basal cell carcinoma of skin) 10/28/2012   left upper back (cx3 58fu)   BCC (basal cell carcinoma of skin) 10/28/2012   right back upper (cx3 68fu)   BCC (basal cell carcinoma of skin) 10/28/2012   right deltoid (cx3 80fu)   BCC (basal cell carcinoma of skin) 10/25/2015   right chest no treatment basaliod neoplasm   DJD (degenerative joint disease)    GERD (gastroesophageal reflux disease)    Hydrocele    Hypertension    Right hydrocele 2021   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   left temple (cx3 60fu)   Seasonal allergies     Past Surgical History:  Procedure Laterality Date   APPENDECTOMY     hemorrhaged post op   [place] SURGERY  04/03/1993    DIAGNOSTIC LAPAROSCOPY     HERNIA REPAIR     umbilical   HYDROCELE EXCISION Right 07/14/2019   Procedure: HYDROCELECTOMY ADULT;  Surgeon: Kathie Rhodes, MD;  Location: [place];  Service: Urology;  Laterality: Right;   KNEE ARTHROSCOPY  04/03/2008   lt   LUMBAR LAMINECTOMY  04/03/2006   SEPTOPLASTY     TONSILLECTOMY Bilateral 1970   TOTAL HIP ARTHROPLASTY Right 11/15/2020   Procedure: RIGHT TOTAL HIP ARTHROPLASTY ANTERIOR APPROACH;  Surgeon: Leandrew Koyanagi, MD;  Location: [facility];  Service: Orthopedics;  Laterality: Right;  3-C    There were no vitals filed for this visit.   Subjective Assessment - 01/25/21 0937     Subjective Pt. indicated performing HEP about 70% or so since last visit.  No pain indicated from hip today.    Pertinent History History of L4, L5 surgery with after effects changing walking pattern, history of basal cell cancers, HTN, GERD    Limitations Standing;Walking;Other (comment)   lying on rt side at times   Patient Stated Goals Exercises to get stronger    Currently in Pain? No/denies    Pain Score 0-No pain    Pain  Onset More than a month ago                [facility] PT Assessment - 01/25/21 0001       Assessment   Medical Diagnosis Z96.641 (ICD-10-CM) - History of hip replacement, total, right    Referring Provider (PT) Leandrew Koyanagi, MD    Onset Date/Surgical Date 11/15/20    Hand Dominance Left      Strength   Right Hip Flexion 4/5    Right Knee Extension 5/5                           [facility] Adult PT Treatment/Exercise - 01/25/21 0001       Exercises   Other Exercises  HEP verbal review      Knee/Hip Exercises: Aerobic   Recumbent Bike Lvl 3 5 mins.   Rt foot/LE ER routinely created difficulty c keeping foot in pedal     Knee/Hip Exercises: Machines for Strengthening   Total Gym Leg Press Double leg 100 lbs 2 x 15, single leg Rt 2 x 15 50 lbs      Knee/Hip Exercises: Standing   Other Standing Knee  Exercises doorway hip abd hold 5 sec x 10 bilateral      Knee/Hip Exercises: Seated   Other Seated Knee/Hip Exercises seated SLR Rt 2 x 10      Knee/Hip Exercises: Sidelying   Hip ABduction Right;10 reps                       PT Short Term Goals - 01/25/21 1093       PT SHORT TERM GOAL #1   Title Patient will demonstrate independent use of home exercise program to maintain progress from in clinic treatments.    Time 3    Period Weeks    Status Achieved    Target Date 01/25/21               PT Long Term Goals - 01/04/21 1106       PT LONG TERM GOAL #1   Title Patient will demonstrate/report pain at worst less than or equal to 2/10 to facilitate minimal limitation in daily activity secondary to pain symptoms.    Time 10    Period Weeks    Status New    Target Date 03/15/21      PT LONG TERM GOAL #2   Title Patient will demonstrate independent use of home exercise program to facilitate ability to maintain/progress functional gains from skilled physical therapy services.    Time 10    Period Weeks    Status New    Target Date 03/15/21      PT LONG TERM GOAL #3   Title Pt. will demonstrate FOTO outcome > or = 65 % to indicated reduced disability due to condition.    Time 10    Period Weeks    Status New    Target Date 03/15/21      PT LONG TERM GOAL #4   Title Pt. will demonstrate Rt knee MMT 5/5 throughout, Rt hip flexion 5/5, Rt hip abduction 4/5 to facilitate stairs, transfers and yard work at [employer].    Time 10    Period Weeks    Status New    Target Date 03/15/21      PT LONG TERM GOAL #5   Title Pt. will demonstrate ability to don/doff Rt shoe s  assistance.    Time 10    Period Weeks    Status New    Target Date 03/15/21                   Plan - 01/25/21 1005     Clinical Impression Statement Some adjusted cues for intervention at home to improve control and technique to improve outcome.  Rt leg strength deficits still noted  but improving as documented.  Recommend continued skilled PT services at this time.    Personal Factors and Comorbidities Comorbidity 3+    Comorbidities History of basal cell cancers, HTN, GERD    Examination-Activity Limitations Sleep;Stairs;Stand;Transfers;Locomotion Level    Examination-Participation Restrictions Community Activity;Yard Work    Stability/Clinical Decision Making Stable/Uncomplicated    Rehab Potential Good    PT Frequency Other (comment)   1-2x/week   PT Duration Other (comment)   10 weeks   PT Treatment/Interventions ADLs/Self Care Home Management;Cryotherapy;Electrical Stimulation;Iontophoresis 4mg /ml Dexamethasone;Moist Heat;Balance training;Therapeutic exercise;Therapeutic activities;Functional mobility training;Stair training;Gait training;DME Instruction;Ultrasound;Neuromuscular re-education;Patient/family education;Passive range of motion;Spinal Manipulations;Joint Manipulations;Dry needling;Taping;Manual techniques    PT Next Visit Plan Lateral side stepping c band, gym activity.    PT Home Exercise Plan [email or web address]    Consulted and Agree with Plan of Care Patient             Patient will benefit from skilled therapeutic intervention in order to improve the following deficits and impairments:  Abnormal gait, Pain, Decreased strength, Decreased activity tolerance, Difficulty walking, Decreased mobility, Decreased balance, Decreased range of motion, Impaired perceived functional ability, Impaired flexibility, Decreased coordination  Visit Diagnosis: Pain in right hip  Muscle weakness (generalized)  Difficulty in walking, not elsewhere classified     Problem List Patient Active Problem List   Diagnosis Date Noted   Primary osteoarthritis of right hip 11/15/2020   Status post total replacement of right hip 11/15/2020   Vertigo    Coronary artery disease due to calcified coronary lesion 07/02/2018   Essential hypertension 07/02/2018   Pure  hypercholesterolemia 07/02/2018    Scot Jun, PT, DPT, OCS, ATC 01/25/21  10:10 AM    [facility] [address] [place], [place], [address] Phone: [phone]   Fax:  [phone]  Name: Adrian [name] MRN: [id number] Date of Birth: [birth year]

## 2021-02-03 ENCOUNTER — Other Ambulatory Visit: Payer: Self-pay

## 2021-02-03 ENCOUNTER — Ambulatory Visit (INDEPENDENT_AMBULATORY_CARE_PROVIDER_SITE_OTHER): Payer: Medicare PPO | Admitting: Physician Assistant

## 2021-02-03 ENCOUNTER — Encounter: Payer: Self-pay | Admitting: Orthopaedic Surgery

## 2021-02-03 DIAGNOSIS — Z96641 Presence of right artificial hip joint: Secondary | ICD-10-CM

## 2021-02-03 NOTE — Progress Notes (Signed)
Post-Op Visit Note   Patient: Adrian [name]           Date of Birth: 1950/09/10           MRN: [id number] Visit Date: 02/03/2021 PCP: Ginger Organ., MD   Assessment & Plan:  Chief Complaint:  Chief Complaint  Patient presents with   Right Hip - Routine Post Op, Pain    Visit Diagnoses:  1. Status post total replacement of right hip     Plan: Patient is a pleasant 70 year old gentleman who comes in today nearly 3 months status post right total hip replacement 11/15/2020.  He has been doing well.  He notes slight soreness at times to the lateral hip.  He has been working on his home exercises with continued improvement of range of motion and strength.  Overall, doing great.  Examination right hip reveals painless range of motion.  Full strength.  He is neurovascular intact distally.  At this point, we will continue to increase activity as tolerated.  Dental prophylaxis reinforced.  Follow-up with [place] in 3 months time for repeat evaluation and AP pelvis x-rays.  Call with concerns or questions.  Follow-Up Instructions: Return in about 3 months (around 05/06/2021).   Orders:  No orders of the defined types were placed in this encounter.  No orders of the defined types were placed in this encounter.   Imaging: No new imaging  PMFS History: Patient Active Problem List   Diagnosis Date Noted   Primary osteoarthritis of right hip 11/15/2020   Status post total replacement of right hip 11/15/2020   Vertigo    Coronary artery disease due to calcified coronary lesion 07/02/2018   Essential hypertension 07/02/2018   Pure hypercholesterolemia 07/02/2018   Past Medical History:  Diagnosis Date   Arthritis    Atypical nevus 04/20/2004   left low back (wider shave)   Basal cell carcinoma 01/16/1995   left back (cx3 84fu exc)   BCC (basal cell carcinoma of skin) 10/13/2002   left supra brow (cx3 fu exc)   BCC (basal cell carcinoma of skin) 04/20/2004   center upper back (cx3  17fu)   BCC (basal cell carcinoma of skin) 04/24/2006   upper mid back (cx3 51fu)   BCC (basal cell carcinoma of skin) 10/28/2012   left upper back (cx3 12fu)   BCC (basal cell carcinoma of skin) 10/28/2012   right back upper (cx3 77fu)   BCC (basal cell carcinoma of skin) 10/28/2012   right deltoid (cx3 14fu)   BCC (basal cell carcinoma of skin) 10/25/2015   right chest no treatment basaliod neoplasm   DJD (degenerative joint disease)    GERD (gastroesophageal reflux disease)    Hydrocele    Hypertension    Right hydrocele 2021   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 No pertinent family history.  Past Surgical History:  Procedure Laterality Date   APPENDECTOMY     hemorrhaged post op   [place] SURGERY  04/03/1993   DIAGNOSTIC LAPAROSCOPY     HERNIA REPAIR     umbilical   HYDROCELE EXCISION Right 07/14/2019   Procedure: HYDROCELECTOMY ADULT;  Surgeon: Kathie Rhodes, MD;  Location: [facility];  Service: Urology;  Laterality: Right;   KNEE ARTHROSCOPY  04/03/2008   lt   LUMBAR LAMINECTOMY  04/03/2006   SEPTOPLASTY  TONSILLECTOMY Bilateral 1970   TOTAL HIP ARTHROPLASTY Right 11/15/2020   Procedure: RIGHT TOTAL HIP ARTHROPLASTY ANTERIOR APPROACH;  Surgeon: Leandrew Koyanagi, MD;  Location: [place];  Service: Orthopedics;  Laterality: Right;  3-C   Social History   Occupational History   Not on file  Tobacco Use   Smoking status: Former    Types: Cigarettes    Start date: 04/03/1972    Quit date: 04/03/1973    Years since quitting: 47.8   Smokeless tobacco: Never  Vaping Use   Vaping Use: Never used  Substance and Sexual Activity   Alcohol use: Not Currently   Drug use: No   Sexual activity: Yes

## 2021-02-11 ENCOUNTER — Encounter: Payer: Self-pay | Admitting: Rehabilitative and Restorative Service Providers"

## 2021-02-11 ENCOUNTER — Ambulatory Visit: Payer: Medicare PPO | Admitting: Rehabilitative and Restorative Service Providers"

## 2021-02-11 ENCOUNTER — Other Ambulatory Visit: Payer: Self-pay

## 2021-02-11 DIAGNOSIS — M6281 Muscle weakness (generalized): Secondary | ICD-10-CM

## 2021-02-11 DIAGNOSIS — M25551 Pain in right hip: Secondary | ICD-10-CM | POA: Diagnosis not present

## 2021-02-11 DIAGNOSIS — R262 Difficulty in walking, not elsewhere classified: Secondary | ICD-10-CM | POA: Diagnosis not present

## 2021-02-11 NOTE — Patient Instructions (Signed)
Access Code: [email or web address] URL: [email or web address] Date: 02/11/2021 Prepared by: Scot Jun  Exercises Supine Bridge - 2 x daily - 7 x weekly - 3 sets - 10 reps - 2 hold Sit to Stand - 1 x daily - 7 x weekly - 3 sets - 10 reps Small Range Straight Leg Raise (Mirrored) - 2 x daily - 7 x weekly - 3 sets - 10 reps Seated Straight Leg Heel Taps - 1 x daily - 7 x weekly - 3 sets - 10 reps Supine Piriformis Stretch Pulling Heel to Hip - 2-3 x daily - 7 x weekly - 1 sets - 5 reps - 20-30 hold Standing Hip Hiking - 2 x daily - 7 x weekly - 1 sets - 10 reps - 5 hold

## 2021-02-11 NOTE — Therapy (Addendum)
[facility] Therapy [address] [place], [place], [address] Phone: [phone]   Fax:  [phone]  Physical Therapy Treatment /Discharge   Patient Details  Name: Adrian [name] MRN: [id number] Date of Birth: 03/28/1951 Referring Provider (PT): Leandrew Koyanagi, MD   Encounter Date: 02/11/2021   PT End of Session - 02/11/21 0838     Visit Number 3    Number of Visits 20    Date for PT Re-Evaluation 03/15/21    Authorization Type [employer] $20 copay.  12 visits 01/04/2021 - 03/06/2021    Authorization - Visit Number 3    Authorization - Number of Visits 12    PT Start Time 0840    PT Stop Time 0918    PT Time Calculation (min) 38 min    Activity Tolerance Patient tolerated treatment well    Behavior During Therapy [facility] for tasks assessed/performed             Past Medical History:  Diagnosis Date   Arthritis    Atypical nevus 04/20/2004   left low back (wider shave)   Basal cell carcinoma 01/16/1995   left back (cx3 21f exc)   BCC (basal cell carcinoma of skin) 10/13/2002   left supra brow (cx3 fu exc)   BCC (basal cell carcinoma of skin) 04/20/2004   center upper back (cx3 518f   BCC (basal cell carcinoma of skin) 04/24/2006   upper mid back (cx3 52f61f  BCC (basal cell carcinoma of skin) 10/28/2012   left upper back (cx3 52fu26f BCC (basal cell carcinoma of skin) 10/28/2012   right back upper (cx3 52fu)47fBCC (basal cell carcinoma of skin) 10/28/2012   right deltoid (cx3 52fu) 40fCC (basal cell carcinoma of skin) 10/25/2015   right chest no treatment basaliod neoplasm   DJD (degenerative joint disease)    GERD (gastroesophageal reflux disease)    Hydrocele    Hypertension    Right hydrocele 2021   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   left temple (cx3 52fu)  42fasonal allergies     Past Surgical History:  Procedure Laterality Date   APPEN[place]CTOMY     hemorrhaged post op   [place]   DIAGNOSTIC LAPAROSCOPY     HERNIA REPAIR     umbilical   HYDROCELE EXCISION Right 07/14/2019   Procedure: HYDROCELECTOMY ADULT;  Surgeon: [place]Kathie Rhodes[place] [place];  Laterality: Right;   KNEE ARTHROSCOPY  04/03/2008   lt   LUMBAR LAMINECTOMY  04/03/2006   SEPTOPLASTY     TONSILLECTOMY Bilateral 1970   TOTAL HIP ARTHROPLASTY Right 11/15/2020   Procedure: RIGHT TOTAL HIP ARTHROPLASTY ANTERIOR APPROACH;  Surgeon: Xu, NaiLeandrew KoyanagiLocation: [facility]; [place] [place];  Laterality: Right;  3-C    There were no vitals filed for this visit.   Subjective Assessment - 02/11/21 0847     Subjective Pt. indicated no complaints from hip today.  Pt. indicated having some lower back pain increase after increase in yard/house work activity.  Pt. indicated he thought he might be ready for transitioning.    Pertinent History History of L4, L5 surgery with after effects changing walking pattern, history of basal cell cancers, HTN, GERD    Limitations Standing;Walking;Other (comment)   lying on rt side at times   Patient Stated Goals Exercises to get stronger  Currently in Pain? No/denies    Pain Score 0-No pain    Pain Location Hip    Pain Orientation Right    Pain Onset More than a month ago                [facility] PT Assessment - 02/11/21 0001       Assessment   Medical Diagnosis Z96.641 (ICD-10-CM) - History of hip replacement, total, right    Referring Provider (PT) Leandrew Koyanagi, MD    Onset Date/Surgical Date 11/15/20    Hand Dominance Left      Observation/Other Assessments   Focus on Therapeutic Outcomes (FOTO)  updated 69%      Strength   Right Hip Flexion 5/5    Right Hip ABduction 4/5                           [facility] Adult PT Treatment/Exercise - 02/11/21 0001       Exercises   Other Exercises  HEP review for trial HEP d/c.      Knee/Hip Exercises: Aerobic   Nustep Lvl 7 10 mins       Knee/Hip Exercises: Standing   Other Standing Knee Exercises doorway hip abd hold 5 sec x 10 bilateral      Knee/Hip Exercises: Seated   Other Seated Knee/Hip Exercises seated Rt SLR 2 x 5    Sit to Sand without UE support;2 sets;10 reps   fast up, slow down                    PT Education - 02/11/21 0905     Education Details HEP for trial d/c.    Person(s) Educated Patient    Methods Verbal cues;Handout;Explanation;Demonstration    Comprehension Verbalized understanding;Returned demonstration              PT Short Term Goals - 01/25/21 9233       PT SHORT TERM GOAL #1   Title Patient will demonstrate independent use of home exercise program to maintain progress from in clinic treatments.    Time 3    Period Weeks    Status Achieved    Target Date 01/25/21               PT Long Term Goals - 02/11/21 0905       PT LONG TERM GOAL #1   Title Patient will demonstrate/report pain at worst less than or equal to 2/10 to facilitate minimal limitation in daily activity secondary to pain symptoms.    Time 10    Period Weeks    Status On-going    Target Date 03/15/21      PT LONG TERM GOAL #2   Title Patient will demonstrate independent use of home exercise program to facilitate ability to maintain/progress functional gains from skilled physical therapy services.    Time 10    Period Weeks    Status Achieved    Target Date 03/15/21      PT LONG TERM GOAL #3   Title Pt. will demonstrate FOTO outcome > or = 65 % to indicated reduced disability due to condition.    Time 10    Period Weeks    Status Achieved    Target Date 03/15/21      PT LONG TERM GOAL #4   Title Pt. will demonstrate Rt knee MMT 5/5 throughout, Rt hip flexion 5/5, Rt hip abduction 4/5 to facilitate  stairs, transfers and yard work at [employer].    Time 10    Period Weeks    Status New    Target Date 03/15/21      PT LONG TERM GOAL #5   Title Pt. will demonstrate ability to don/doff  Rt shoe s assistance.    Time 10    Period Weeks    Status Achieved                   Plan - 02/11/21 0859     Clinical Impression Statement Pt. has demonstrated improvement in FOTO outcome and symptoms related to Rt hip.  Fair to good overall HEP knowledge at this time.  Pt. did continue to present c strength deficits in Rt LE that can impact daily function.  Recommend trial HEP at this time c return prn.    Personal Factors and Comorbidities Comorbidity 3+    Comorbidities History of basal cell cancers, HTN, GERD    Examination-Activity Limitations Sleep;Stairs;Stand;Transfers;Locomotion Level    Examination-Participation Restrictions Community Activity;Yard Work    Stability/Clinical Decision Making Stable/Uncomplicated    Rehab Potential Good    PT Frequency Other (comment)   1-2x/week   PT Duration Other (comment)   10 weeks   PT Treatment/Interventions ADLs/Self Care Home Management;Cryotherapy;Electrical Stimulation;Iontophoresis 22m/ml Dexamethasone;Moist Heat;Balance training;Therapeutic exercise;Therapeutic activities;Functional mobility training;Stair training;Gait training;DME Instruction;Ultrasound;Neuromuscular re-education;Patient/family education;Passive range of motion;Spinal Manipulations;Joint Manipulations;Dry needling;Taping;Manual techniques    PT Next Visit Plan Trial HEP.    PT Home Exercise Plan [id number]    Consulted and Agree with Plan of Care Patient             Patient will benefit from skilled therapeutic intervention in order to improve the following deficits and impairments:  Abnormal gait, Pain, Decreased strength, Decreased activity tolerance, Difficulty walking, Decreased mobility, Decreased balance, Decreased range of motion, Impaired perceived functional ability, Impaired flexibility, Decreased coordination  Visit Diagnosis: Pain in right hip  Muscle weakness (generalized)  Difficulty in walking, not elsewhere  classified     Problem List Patient Active Problem List   Diagnosis Date Noted   Primary osteoarthritis of right hip 11/15/2020   Status post total replacement of right hip 11/15/2020   Vertigo    Coronary artery disease due to calcified coronary lesion 07/02/2018   Essential hypertension 07/02/2018   Pure hypercholesterolemia 07/02/2018   MScot Jun PT, DPT, OCS, ATC 02/11/21  9:20 AM  PHYSICAL THERAPY DISCHARGE SUMMARY  Visits from Start of Care: 3  Current functional level related to goals / functional outcomes: See note   Remaining deficits: See note   Education / Equipment: HEP   Patient agrees to discharge. Patient goals were partially met. Patient is being discharged due to not returning since the last visit.  MScot Jun PT, DPT, OCS, ATC 03/14/21  9:50 AM     [facility] [address] [address]Phone: [phone]  Fax:  [phone] Name: RHANCEL IONMRN: [id number] of Birth: [birth year]

## 2021-02-21 ENCOUNTER — Telehealth: Payer: Self-pay | Admitting: *Deleted

## 2021-02-21 NOTE — Telephone Encounter (Signed)
Yes that's fine.  Thanks for checking

## 2021-02-21 NOTE — Telephone Encounter (Signed)
90 day Ortho bundle call to patient- states he is doing well overall. Wants to know if he can return to swimming in his outdoor-[employer] heated pool? No issues with incision he reports. I told him most likely this is fine at this point, but I'd check first. Thanks.

## 2021-03-29 ENCOUNTER — Encounter: Payer: Self-pay | Admitting: Orthopaedic Surgery

## 2021-04-11 DIAGNOSIS — H40013 Open angle with borderline findings, low risk, bilateral: Secondary | ICD-10-CM | POA: Diagnosis not present

## 2021-04-11 DIAGNOSIS — H524 Presbyopia: Secondary | ICD-10-CM | POA: Diagnosis not present

## 2021-04-11 DIAGNOSIS — H2589 Other age-related cataract: Secondary | ICD-10-CM | POA: Diagnosis not present

## 2021-04-11 DIAGNOSIS — H1045 Other chronic allergic conjunctivitis: Secondary | ICD-10-CM | POA: Diagnosis not present

## 2021-04-11 DIAGNOSIS — H25813 Combined forms of age-related cataract, bilateral: Secondary | ICD-10-CM | POA: Diagnosis not present

## 2021-04-13 DIAGNOSIS — M5412 Radiculopathy, cervical region: Secondary | ICD-10-CM | POA: Diagnosis not present

## 2021-04-13 DIAGNOSIS — M542 Cervicalgia: Secondary | ICD-10-CM | POA: Diagnosis not present

## 2021-04-14 ENCOUNTER — Other Ambulatory Visit: Payer: Self-pay | Admitting: Internal Medicine

## 2021-04-14 DIAGNOSIS — M5412 Radiculopathy, cervical region: Secondary | ICD-10-CM

## 2021-04-21 ENCOUNTER — Encounter: Payer: Self-pay | Admitting: Physician Assistant

## 2021-04-21 ENCOUNTER — Ambulatory Visit: Payer: Medicare PPO | Admitting: Physician Assistant

## 2021-04-21 ENCOUNTER — Other Ambulatory Visit: Payer: Self-pay

## 2021-04-21 DIAGNOSIS — L570 Actinic keratosis: Secondary | ICD-10-CM

## 2021-04-21 DIAGNOSIS — Z86018 Personal history of other benign neoplasm: Secondary | ICD-10-CM | POA: Diagnosis not present

## 2021-04-21 DIAGNOSIS — Z85828 Personal history of other malignant neoplasm of skin: Secondary | ICD-10-CM | POA: Diagnosis not present

## 2021-04-26 ENCOUNTER — Encounter: Payer: Self-pay | Admitting: Physician Assistant

## 2021-04-26 NOTE — Progress Notes (Signed)
° °  New Patient   Subjective  Adrian [name] is a 71 y.o. male who presents for the following: New Patient (Initial Visit) (Patient here today for skin check, per patient he had a crusty lesion that was on his left chest that came off. Personal history of atypical moles, and non mole skin cancer. ).   The following portions of the chart were reviewed this encounter and updated as appropriate:  Tobacco   Allergies   Meds   Problems   Med Hx   Surg Hx   Fam Hx       Objective  Well appearing patient in no apparent distress; mood and affect are within normal limits.  All skin waist up examined.  Left Forearm - Posterior, Left Forehead (2), Left Hand - Posterior (3), Left Upper Back (2), Right Forearm - Posterior (2), Right Temple (3) Erythematous patches with gritty scale.   Assessment & Plan  AK (actinic keratosis) (13) Left Hand - Posterior (3); Left Forearm - Posterior; Right Forearm - Posterior (2); Left Upper Back (2); Right Temple (3); Left Forehead (2)  Destruction of lesion - Left Forearm - Posterior, Left Forehead, Left Hand - Posterior, Left Upper Back, Right Forearm - Posterior, Right Temple Complexity: simple   Destruction method: cryotherapy   Informed consent: discussed and consent obtained   Timeout:  patient name, date of birth, surgical site, and procedure verified Lesion destroyed using liquid nitrogen: Yes   Cryotherapy cycles:  3 Outcome: patient tolerated procedure well with no complications   Post-procedure details: wound care instructions given       I, Joshoa Shawler, PA-C, have reviewed all documentation's for this visit.  The documentation on 04/26/21 for the exam, diagnosis, procedures and orders are all accurate and complete.

## 2021-04-28 ENCOUNTER — Telehealth: Payer: Self-pay | Admitting: Orthopaedic Surgery

## 2021-04-28 NOTE — Telephone Encounter (Signed)
Pt called requesting antibiotics for upcoming dental appt on Tuesday 1/31. Please send to pharmacy on file. Pt phone number is [phone].

## 2021-04-29 ENCOUNTER — Other Ambulatory Visit: Payer: Self-pay | Admitting: Physician Assistant

## 2021-04-29 MED ORDER — AMOXICILLIN 500 MG PO CAPS: ORAL_CAPSULE | ORAL | 2 refills | Status: DC

## 2021-04-29 NOTE — Telephone Encounter (Signed)
Sent in

## 2021-05-06 ENCOUNTER — Ambulatory Visit (INDEPENDENT_AMBULATORY_CARE_PROVIDER_SITE_OTHER): Payer: Medicare PPO

## 2021-05-06 ENCOUNTER — Other Ambulatory Visit: Payer: Self-pay

## 2021-05-06 ENCOUNTER — Encounter: Payer: Self-pay | Admitting: Orthopaedic Surgery

## 2021-05-06 ENCOUNTER — Ambulatory Visit: Payer: Medicare PPO | Admitting: Orthopaedic Surgery

## 2021-05-06 VITALS — Ht 70.5 in | Wt 191.0 lb

## 2021-05-06 DIAGNOSIS — Z96641 Presence of right artificial hip joint: Secondary | ICD-10-CM | POA: Diagnosis not present

## 2021-05-06 DIAGNOSIS — M25562 Pain in left knee: Secondary | ICD-10-CM | POA: Diagnosis not present

## 2021-05-06 DIAGNOSIS — G8929 Other chronic pain: Secondary | ICD-10-CM

## 2021-05-06 NOTE — Progress Notes (Signed)
Office Visit Note   Patient: Adrian [name]           Date of Birth: Aug 16, 1950           MRN: [id number] Visit Date: 05/06/2021              Requested by: Ginger Organ., MD [address] [place],  [place] [address] PCP: Ginger Organ., MD   Assessment & Plan: Visit Diagnoses:  1. Status post total replacement of right hip   2. Chronic pain of left knee     Plan: Adrian [name] has done well from his hip replacement.  Dental prophylaxis was reinforced.  We will see him back in another 6 months for his 1 year follow-up visit.  He will need standing AP pelvis x-rays at that time.  In regards to the left knee he will continue to use Voltaren gel and avoid certain activities.  Over-the-counter NSAIDs.  Will hold off on cortisone injection today.  Follow-Up Instructions: Return in about 6 months (around 11/03/2021).   Orders:  Orders Placed This Encounter  Procedures   XR Pelvis 1-2 Views   XR KNEE 3 VIEW LEFT   No orders of the defined types were placed in this encounter.     Procedures: No procedures performed   Clinical Data: No additional findings.   Subjective: Chief Complaint  Patient presents with   Right Hip - Follow-up    Right total hip arthroplasty 11/15/2020    HPI  Adrian [name] comes in today for 20-month postop check from right total hip replacement.  He is also here to be evaluated for left knee pain.  In terms of the right hip he is doing well and reports some soreness at times.  He has some weakness with seated hip flexion.  In regards to the left knee he has anterior knee pain and has trouble using stairs.  This has been going on for months..  Review of Systems  Constitutional: Negative.   All other systems reviewed and are negative.   Objective: Vital Signs: Ht 5' 10.5" (1.791 m)    Wt 191 lb (86.6 kg)    BMI 27.02 kg/m   Physical Exam Vitals and nursing note reviewed.  Constitutional:      Appearance: He is well-developed.  Pulmonary:     Effort:  Pulmonary effort is normal.  Abdominal:     Palpations: Abdomen is soft.  Skin:    General: Skin is warm.  Neurological:     Mental Status: He is alert and oriented to person, place, and time.  Psychiatric:        Behavior: Behavior normal.        Thought Content: Thought content normal.        Judgment: Judgment normal.    Ortho Exam  Examination of the right hip shows a fully healed surgical scar.  He has a slight limp with ambulation.  He has range of motion without pain.  Examination left knee shows trace effusion.  2+ patellofemoral crepitus with range of motion.  Collaterals and cruciates are stable.  Specialty Comments:  No specialty comments available.  Imaging: XR KNEE 3 VIEW LEFT  Result Date: 05/06/2021 Moderate femoral-tibial compartment osteoarthritis with joint space narrowing.  Advanced patellofemoral arthritis.  XR Pelvis 1-2 Views  Result Date: 05/06/2021 Stable total hip replacement without complications    PMFS History: Patient Active Problem List   Diagnosis Date Noted   Primary osteoarthritis of right hip 11/15/2020  Status post total replacement of right hip 11/15/2020   Vertigo    Coronary artery disease due to calcified coronary lesion 07/02/2018   Essential hypertension 07/02/2018   Pure hypercholesterolemia 07/02/2018   Past Medical History:  Diagnosis Date   Arthritis    Atypical nevus 04/20/2004   left low back (wider shave)   Basal cell carcinoma 01/16/1995   left back (cx3 72fu exc)   BCC (basal cell carcinoma of skin) 10/13/2002   left supra brow (cx3 fu exc)   BCC (basal cell carcinoma of skin) 04/20/2004   center upper back (cx3 51fu)   BCC (basal cell carcinoma of skin) 04/24/2006   upper mid back (cx3 61fu)   BCC (basal cell carcinoma of skin) 10/28/2012   left upper back (cx3 21fu)   BCC (basal cell carcinoma of skin) 10/28/2012   right back upper (cx3 4fu)   BCC (basal cell carcinoma of skin) 10/28/2012   right deltoid (cx3  72fu)   BCC (basal cell carcinoma of skin) 10/25/2015   right chest no treatment basaliod neoplasm   DJD (degenerative joint disease)    GERD (gastroesophageal reflux disease)    Hydrocele    Hypertension    Right hydrocele 2021   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  Past Surgical History:  Procedure Laterality Date   APPENDECTOMY     hemorrhaged post op   [place] SURGERY  04/03/1993   DIAGNOSTIC LAPAROSCOPY     HERNIA REPAIR     umbilical   HYDROCELE EXCISION Right 07/14/2019   Procedure: HYDROCELECTOMY ADULT;  Surgeon: Kathie Rhodes, MD;  Location: [facility];  Service: Urology;  Laterality: Right;   KNEE ARTHROSCOPY  04/03/2008   lt   LUMBAR LAMINECTOMY  04/03/2006   SEPTOPLASTY     TONSILLECTOMY Bilateral 1970   TOTAL HIP ARTHROPLASTY Right 11/15/2020   Procedure: RIGHT TOTAL HIP ARTHROPLASTY ANTERIOR APPROACH;  Surgeon: Leandrew Koyanagi, MD;  Location: [facility];  Service: Orthopedics;  Laterality: Right;  3-C   Social History   Occupational History   Not on file  Tobacco Use   Smoking status: Former    Types: Cigarettes    Start date: 04/03/1972    Quit date: 04/03/1973    Years since quitting: 48.1   Smokeless tobacco: Never  Vaping Use   Vaping Use: Never used  Substance and Sexual Activity   Alcohol use: Not Currently   Drug use: No   Sexual activity: Yes

## 2021-05-10 ENCOUNTER — Telehealth: Payer: Self-pay | Admitting: Orthopaedic Surgery

## 2021-05-10 NOTE — Telephone Encounter (Signed)
Called and advised pt.

## 2021-05-10 NOTE — Telephone Encounter (Signed)
yes

## 2021-05-10 NOTE — Telephone Encounter (Signed)
Patient called. He would like to know if it is safe for him to have a MRI done on his neck? His call back number is [phone]

## 2021-05-18 ENCOUNTER — Other Ambulatory Visit: Payer: Self-pay

## 2021-05-18 ENCOUNTER — Ambulatory Visit
Admission: RE | Admit: 2021-05-18 | Discharge: 2021-05-18 | Disposition: A | Discharge status: Home / Self Care | Payer: Medicare PPO | Source: Ambulatory Visit | Attending: Internal Medicine | Admitting: Internal Medicine

## 2021-05-18 DIAGNOSIS — M2578 Osteophyte, vertebrae: Secondary | ICD-10-CM | POA: Diagnosis not present

## 2021-05-18 DIAGNOSIS — M5412 Radiculopathy, cervical region: Secondary | ICD-10-CM

## 2021-05-18 DIAGNOSIS — R200 Anesthesia of skin: Secondary | ICD-10-CM | POA: Diagnosis not present

## 2021-05-18 DIAGNOSIS — M4802 Spinal stenosis, cervical region: Secondary | ICD-10-CM | POA: Diagnosis not present

## 2021-05-18 DIAGNOSIS — M542 Cervicalgia: Secondary | ICD-10-CM | POA: Diagnosis not present

## 2021-07-09 ENCOUNTER — Ambulatory Visit (INDEPENDENT_AMBULATORY_CARE_PROVIDER_SITE_OTHER): Payer: Medicare PPO

## 2021-07-09 ENCOUNTER — Encounter: Payer: Self-pay | Admitting: Emergency Medicine

## 2021-07-09 ENCOUNTER — Other Ambulatory Visit: Payer: Self-pay

## 2021-07-09 ENCOUNTER — Ambulatory Visit
Admission: EM | Admit: 2021-07-09 | Discharge: 2021-07-09 | Disposition: A | Discharge status: Home / Self Care | Payer: Medicare PPO | Attending: Physician Assistant | Admitting: Physician Assistant

## 2021-07-09 DIAGNOSIS — M19041 Primary osteoarthritis, right hand: Secondary | ICD-10-CM | POA: Diagnosis not present

## 2021-07-09 DIAGNOSIS — M79644 Pain in right finger(s): Secondary | ICD-10-CM

## 2021-07-09 NOTE — ED Triage Notes (Signed)
Pt here for right index finger pain x 3 weeks; no obvious injury noted  ?

## 2021-07-09 NOTE — ED Provider Notes (Signed)
[name]    CSN: [id number] Arrival date & time: 07/09/21  1423      History   Chief Complaint Chief Complaint  Patient presents with   Finger Injury    HPI Adrian [name] is a 71 y.o. male.   Pt complains of right index finger that started about three weeks ago.  Pt reports he does a lot of yard work, but doesn't remember an injury or trauma that may have caused the pain.  He has tried nothing for the sx.  He is getting ready for a trip to a remote area and wanted to have the finger checked out before his travels.    Past Medical History:  Diagnosis Date   Arthritis    Atypical nevus 04/20/2004   left low back (wider shave)   Basal cell carcinoma 01/16/1995   left back (cx3 76fu exc)   BCC (basal cell carcinoma of skin) 10/13/2002   left supra brow (cx3 fu exc)   BCC (basal cell carcinoma of skin) 04/20/2004   center upper back (cx3 35fu)   BCC (basal cell carcinoma of skin) 04/24/2006   upper mid back (cx3 1fu)   BCC (basal cell carcinoma of skin) 10/28/2012   left upper back (cx3 46fu)   BCC (basal cell carcinoma of skin) 10/28/2012   right back upper (cx3 30fu)   BCC (basal cell carcinoma of skin) 10/28/2012   right deltoid (cx3 29fu)   BCC (basal cell carcinoma of skin) 10/25/2015   right chest no treatment basaliod neoplasm   DJD (degenerative joint disease)    GERD (gastroesophageal reflux disease)    Hydrocele    Hypertension    Right hydrocele 2021   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   left temple (cx3 37fu)   Seasonal allergies     Patient Active Problem List   Diagnosis Date Noted   Primary osteoarthritis of right hip 11/15/2020   Status post total replacement of right hip 11/15/2020   Vertigo    Coronary artery disease due to calcified coronary lesion 07/02/2018   Essential hypertension 07/02/2018   Pure hypercholesterolemia 07/02/2018    Past Surgical History:  Procedure Laterality Date   APPENDECTOMY      hemorrhaged post op   BACK SURGERY     CERVICAL DISC SURGERY  04/03/1993   DIAGNOSTIC LAPAROSCOPY     HERNIA REPAIR     umbilical   HYDROCELE EXCISION Right 07/14/2019   Procedure: HYDROCELECTOMY ADULT;  Surgeon: Ihor Gully, MD;  Location: [facility];  Service: Urology;  Laterality: Right;   KNEE ARTHROSCOPY  04/03/2008   lt   LUMBAR LAMINECTOMY  04/03/2006   SEPTOPLASTY     TONSILLECTOMY Bilateral 1970   TOTAL HIP ARTHROPLASTY Right 11/15/2020   Procedure: RIGHT TOTAL HIP ARTHROPLASTY ANTERIOR APPROACH;  Surgeon: Tarry Kos, MD;  Location: [facility];  Service: Orthopedics;  Laterality: Right;  3-C       Home Medications    Prior to Admission medications   Medication Sig Start Date End Date Taking? Authorizing Provider  acetaminophen (TYLENOL) 500 MG tablet Take 500 mg by mouth every 6 (six) hours as needed for moderate pain.    [provider]  amoxicillin (AMOXIL) 500 MG capsule Take 4 pills one hour prior to dental work. 04/29/21   Cristie Hem, PA-C  aspirin EC 81 MG tablet Take 1 tablet (81 mg total) by mouth 2 (two) times daily. To be taken after  surgery 11/08/20   Cristie Hem, PA-C  Cholecalciferol (VITAMIN D) 50 MCG (2000 UT) CAPS Take 2,000 Units by mouth daily.    [provider]  Coenzyme Q10 (COQ10 PO) Take 300 mg by mouth daily.    [provider]  fluticasone (FLONASE) 50 MCG/ACT nasal spray Place 2 sprays into both nostrils daily.    [provider]  ibuprofen (ADVIL) 200 MG tablet Take 200 mg by mouth every 6 (six) hours as needed for moderate pain.    [provider]  rosuvastatin (CRESTOR) 20 MG tablet Take 20 mg by mouth daily.    [provider]    Family History History reviewed. No pertinent family history.  Social History Social History   Tobacco Use   Smoking status: Former    Types: Cigarettes    Start date: 04/03/1972    Quit date: 04/03/1973    Years since quitting: 48.2    Smokeless tobacco: Never  Vaping Use   Vaping Use: Never used  Substance Use Topics   Alcohol use: Not Currently   Drug use: No     Allergies   Patient has no known allergies.   Review of Systems Review of Systems  Constitutional:  Negative for chills and fever.  HENT:  Negative for ear pain and sore throat.   Eyes:  Negative for pain and visual disturbance.  Respiratory:  Negative for cough and shortness of breath.   Cardiovascular:  Negative for chest pain and palpitations.  Gastrointestinal:  Negative for abdominal pain and vomiting.  Genitourinary:  Negative for dysuria and hematuria.  Musculoskeletal:  Positive for arthralgias (right index finger pain). Negative for back pain.  Skin:  Negative for color change and rash.  Neurological:  Negative for seizures and syncope.  All other systems reviewed and are negative.   Physical Exam Triage Vital [name] ED Triage Vitals [07/09/21 1524]  Enc Vitals Group     BP 129/73     Pulse Rate 85     Resp 18     Temp 98.4 F (36.9 C)     Temp Source Oral     SpO2 95 %     Weight      Height      Head Circumference      Peak Flow      Pain Score 3     Pain Loc      Pain Edu?      Excl. in GC?    No data found.  Updated Vital [name] BP 129/73 (BP Location: Left Arm)   Pulse 85   Temp 98.4 F (36.9 C) (Oral)   Resp 18   SpO2 95%   Visual Acuity Right Eye Distance:   Left Eye Distance:   Bilateral Distance:    Right Eye Near:   Left Eye Near:    Bilateral Near:     Physical Exam Vitals and nursing note reviewed.  Constitutional:      General: He is not in acute distress.    Appearance: He is well-developed.  HENT:     Head: Normocephalic and atraumatic.  Eyes:     Conjunctiva/sclera: Conjunctivae normal.  Cardiovascular:     Rate and Rhythm: Normal rate and regular rhythm.     Heart sounds: No murmur heard. Pulmonary:     Effort: Pulmonary effort is normal. No respiratory distress.     Breath sounds:  Normal breath sounds.  Abdominal:     Palpations: Abdomen is soft.  Tenderness: There is no abdominal tenderness.  Musculoskeletal:        General: No swelling.     Cervical back: Neck supple.     Comments: Right index finger DIP enlarged joint with tenderness.  Mild decreased ROM, tenderness to right index finger PIP  Skin:    General: Skin is warm and dry.     Capillary Refill: Capillary refill takes less than 2 seconds.  Neurological:     Mental Status: He is alert.  Psychiatric:        Mood and Affect: Mood normal.     UC Treatments / Results  Labs (all labs ordered are listed, but only abnormal results are displayed) Labs Reviewed - No data to display  EKG   Radiology DG Finger Index Right  Result Date: 07/09/2021 CLINICAL DATA:  Pain.  No known injury. EXAM: RIGHT INDEX FINGER 2+V COMPARISON:  None. FINDINGS: No acute-appearing osseous abnormality. No fracture line or displaced fracture fragment. Degenerative spurring at the DIP joint, at least moderate in degree. Soft tissues about the index finger are unremarkable. IMPRESSION: 1. No acute findings. 2. Degenerative spurring at the DIP joint, at least moderate in degree. Electronically Signed   By: Bary Richard M.D.   On: 07/09/2021 14:59    Procedures Procedures (including critical care time)  Medications Ordered in UC Medications - No data to display  Initial Impression / Assessment and Plan / UC Course  I have reviewed the triage vital [name] and the nursing notes.  Pertinent labs & imaging results that were available during my care of the patient were reviewed by me and considered in my medical decision making (see chart for details).     Right finger pain likely due to OA, bone spurring. Xray negative for fracture, bone spurring noted.  Finger is not red and swollen, no [name] of infection or gout.  Advised tylenol as needed.  Final Clinical Impressions(s) / UC Diagnoses   Final diagnoses:  Finger pain,  right     Discharge Instructions      Xray negative for fracture.  No [name] of infection or gout.  Recommend Tylenol as needed for osteoarthritis.     ED Prescriptions   None    PDMP not reviewed this encounter.   Ward, Tylene Fantasia, PA-C 07/09/21 [phone]

## 2021-07-09 NOTE — Discharge Instructions (Addendum)
Xray negative for fracture.  ?No signs of infection or gout.  ?Recommend Tylenol as needed for osteoarthritis.  ? ?

## 2021-09-30 NOTE — Progress Notes (Unsigned)
HPI: Follow-up coronary artery disease.  Previously followed by Dr. Harrell Gave but transitioning to me.  Calcium score March 2020 2190 which was 95th percentile.  Exercise treadmill October 2020 showed excellent functional capacity with no ST changes.  Since last seen  Current Outpatient Medications  Medication Sig Dispense Refill   acetaminophen (TYLENOL) 500 MG tablet Take 500 mg by mouth every 6 (six) hours as needed for moderate pain.     amoxicillin (AMOXIL) 500 MG capsule Take 4 pills one hour prior to dental work. 8 capsule 2   aspirin EC 81 MG tablet Take 1 tablet (81 mg total) by mouth 2 (two) times daily. To be taken after surgery 84 tablet 0   Cholecalciferol (VITAMIN D) 50 MCG (2000 UT) CAPS Take 2,000 Units by mouth daily.     Coenzyme Q10 (COQ10 PO) Take 300 mg by mouth daily.     fluticasone (FLONASE) 50 MCG/ACT nasal spray Place 2 sprays into both nostrils daily.     ibuprofen (ADVIL) 200 MG tablet Take 200 mg by mouth every 6 (six) hours as needed for moderate pain.     rosuvastatin (CRESTOR) 20 MG tablet Take 20 mg by mouth daily.     No current facility-administered medications for this visit.     Past Medical History:  Diagnosis Date   Arthritis    Atypical nevus 04/20/2004   left low back (wider shave)   Basal cell carcinoma 01/16/1995   left back (cx3 98f exc)   BCC (basal cell carcinoma of skin) 10/13/2002   left supra brow (cx3 fu exc)   BCC (basal cell carcinoma of skin) 04/20/2004   center upper back (cx3 567f   BCC (basal cell carcinoma of skin) 04/24/2006   upper mid back (cx3 81f44f  BCC (basal cell carcinoma of skin) 10/28/2012   left upper back (cx3 81fu11f BCC (basal cell carcinoma of skin) 10/28/2012   right back upper (cx3 81fu)44fBCC (basal cell carcinoma of skin) 10/28/2012   right deltoid (cx3 81fu) 46fCC (basal cell carcinoma of skin) 10/25/2015   right chest no treatment basaliod neoplasm   DJD (degenerative joint disease)    GERD  (gastroesophageal reflux disease)    Hydrocele    Hypertension    Right hydrocele 2021   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   left temple (cx3 81fu)  59fasonal allergies     Past Surgical History:  Procedure Laterality Date   APPENDECTOMY     hemorrhaged post op   BACK SUEmmaY  04/03/1993   DIAGNOSTIC LAPAROSCOPY     HERNIA REPAIR     umbilical   HYDROCELE EXCISION Right 07/14/2019   Procedure: HYDROCELECTOMY ADULT;  Surgeon: OttelinKathie Rhodes[place] [facility];  Laterality: Right;   KNEE ARTHROSCOPY  04/03/2008   lt   LUMBAR LAMINECTOMY  04/03/2006   SEPTOPLASTY     TONSILLECTOMY Bilateral 1970   TOTAL HIP ARTHROPLASTY Right 11/15/2020   Procedure: RIGHT TOTAL HIP ARTHROPLASTY ANTERIOR APPROACH;  Surgeon: Xu, NaiLeandrew KoyanagiLocation: [facility]; [place];  Laterality: Right;  3-C    Social History   Socioeconomic History   Marital status: Married    Spouse name: Not on file   Number of children: Not on file   Years of education: Not on file   Highest education level: Not on  file  Occupational History   Not on file  Tobacco Use   Smoking status: Former    Types: Cigarettes    Start date: 04/03/1972    Quit date: 04/03/1973    Years since quitting: 48.5   Smokeless tobacco: Never  Vaping Use   Vaping Use: Never used  Substance and Sexual Activity   Alcohol use: Not Currently   Drug use: No   Sexual activity: Yes  Other Topics Concern   Not on file  Social History Narrative   Not on file   Social Determinants of Health   Financial Resource Strain: Not on file  Food Insecurity: Not on file  Transportation Needs: Not on file  Physical Activity: Not on file  Stress: Not on file  Social Connections: Not on file  Intimate Partner Violence: Not on file    No family history on file.  ROS: no fevers or chills, productive cough, hemoptysis, dysphasia, odynophagia, melena,  hematochezia, dysuria, hematuria, rash, seizure activity, orthopnea, PND, pedal edema, claudication. Remaining systems are negative.  Physical Exam: Well-developed well-nourished in no acute distress.  Skin is warm and dry.  HEENT is normal.  Neck is supple.  Chest is clear to auscultation with normal expansion.  Cardiovascular exam is regular rate and rhythm.  Abdominal exam nontender or distended. No masses palpated. Extremities show no edema. neuro grossly intact  ECG- personally reviewed  A/P  1 coronary artery disease-based on elevated calcium score.  Previous exercise treadmill negative with good functional capacity.  Plan to continue medical therapy as he denies chest pain.  Continue aspirin 81 mg daily.  Continue statin.  2 hyperlipidemia-given documented coronary disease would favor increasing Crestor to 40 mg daily.  We will check lipids and liver 8 weeks later.  Goal LDL less than 50.  3 hypertension-patient's blood pressure is controlled.  Continue present medical regimen.  Kirk Ruths, MD

## 2021-10-03 ENCOUNTER — Encounter: Payer: Self-pay | Admitting: Cardiology

## 2021-10-03 ENCOUNTER — Ambulatory Visit: Payer: Medicare PPO | Admitting: Cardiology

## 2021-10-03 VITALS — BP 136/68 | HR 86 | Ht 70.0 in | Wt 199.6 lb

## 2021-10-03 DIAGNOSIS — I10 Essential (primary) hypertension: Secondary | ICD-10-CM

## 2021-10-03 DIAGNOSIS — I2584 Coronary atherosclerosis due to calcified coronary lesion: Secondary | ICD-10-CM

## 2021-10-03 DIAGNOSIS — R0989 Other specified symptoms and signs involving the circulatory and respiratory systems: Secondary | ICD-10-CM | POA: Diagnosis not present

## 2021-10-03 DIAGNOSIS — E7800 Pure hypercholesterolemia, unspecified: Secondary | ICD-10-CM | POA: Diagnosis not present

## 2021-10-03 DIAGNOSIS — I2510 Atherosclerotic heart disease of native coronary artery without angina pectoris: Secondary | ICD-10-CM

## 2021-10-03 MED ORDER — ROSUVASTATIN CALCIUM 40 MG PO TABS: 40.0000 mg | ORAL_TABLET | Freq: Every day | ORAL | 3 refills | Status: DC

## 2021-10-03 NOTE — Patient Instructions (Signed)
Medication Instructions:   INCREASE ROSUVASTATIN TO 40 MG ONCE DAILY= 2 OF THE 20 MG TABLETS ONCE DAILY  *If you need a refill on your cardiac medications before your next appointment, please call your pharmacy*   Lab Work:  Your physician recommends that you return for lab work in: [place]  If you have labs (blood work) drawn today and your tests are completely normal, you will receive your results only by: [place] (if you have MyChart) OR A paper copy in the mail If you have any lab test that is abnormal or we need to change your treatment, we will call you to review the results.   Testing/Procedures:  Your physician has requested that you have an abdominal aorta duplex. During this test, an ultrasound is used to evaluate the aorta. Allow 30 minutes for this exam. Do not eat after midnight the day before and avoid carbonated beverages [facility]   Follow-Up: At [facility], you and your health needs are our priority.  As part of our continuing mission to provide you with exceptional heart care, we have created designated Provider Care Teams.  These Care Teams include your primary Cardiologist (physician) and Advanced Practice Providers (APPs -  Physician Assistants and Nurse Practitioners) who all work together to provide you with the care you need, when you need it.  We recommend signing up for the patient portal called "MyChart".  Sign up information is provided on this After Visit Summary.  MyChart is used to connect with patients for Virtual Visits (Telemedicine).  Patients are able to view lab/test results, encounter notes, upcoming appointments, etc.  Non-urgent messages can be sent to your provider as well.   To learn more about what you can do with MyChart, go to [email or web address].    Your next appointment:   6 month(s)  The format for your next appointment:   In Person  Provider:   Kirk Ruths MD      Important Information About  [place]

## 2021-10-12 ENCOUNTER — Ambulatory Visit (HOSPITAL_COMMUNITY)
Admission: RE | Admit: 2021-10-12 | Discharge: 2021-10-12 | Disposition: A | Discharge status: Home / Self Care | Payer: Medicare PPO | Source: Ambulatory Visit | Attending: Cardiovascular Disease | Admitting: Cardiovascular Disease

## 2021-10-12 DIAGNOSIS — Z87891 Personal history of nicotine dependence: Secondary | ICD-10-CM | POA: Insufficient documentation

## 2021-10-12 DIAGNOSIS — I2510 Atherosclerotic heart disease of native coronary artery without angina pectoris: Secondary | ICD-10-CM | POA: Diagnosis not present

## 2021-10-12 DIAGNOSIS — I10 Essential (primary) hypertension: Secondary | ICD-10-CM | POA: Insufficient documentation

## 2021-10-12 DIAGNOSIS — R0989 Other specified symptoms and signs involving the circulatory and respiratory systems: Secondary | ICD-10-CM | POA: Insufficient documentation

## 2021-10-12 DIAGNOSIS — Z136 Encounter for screening for cardiovascular disorders: Secondary | ICD-10-CM | POA: Diagnosis not present

## 2021-10-12 DIAGNOSIS — E785 Hyperlipidemia, unspecified: Secondary | ICD-10-CM | POA: Insufficient documentation

## 2021-10-14 ENCOUNTER — Encounter: Payer: Self-pay | Admitting: *Deleted

## 2021-11-03 DIAGNOSIS — J0110 Acute frontal sinusitis, unspecified: Secondary | ICD-10-CM | POA: Diagnosis not present

## 2021-11-04 ENCOUNTER — Encounter: Payer: Self-pay | Admitting: Physician Assistant

## 2021-11-04 ENCOUNTER — Ambulatory Visit
Admission: EM | Admit: 2021-11-04 | Discharge: 2021-11-04 | Disposition: A | Discharge status: Home / Self Care | Payer: Medicare PPO | Attending: Physician Assistant | Admitting: Physician Assistant

## 2021-11-04 DIAGNOSIS — J0110 Acute frontal sinusitis, unspecified: Secondary | ICD-10-CM | POA: Diagnosis not present

## 2021-11-04 MED ORDER — AMOXICILLIN-POT CLAVULANATE 875-125 MG PO TABS: 1.0000 | ORAL_TABLET | Freq: Two times a day (BID) | ORAL | 0 refills | Status: DC

## 2021-11-04 NOTE — ED Provider Notes (Addendum)
[name]    CSN: [id number] Arrival date & time: 11/04/21  0737      History   Chief Complaint Chief Complaint  Patient presents with   Sinuitis    HPI Adrian [name] is a 71 y.o. male.   Patient here today for evaluation of right ear fullness, frontal sinus pain and congestion that has been present for over a week.  He reports that this has occurred in the past with sinus infections.  He has not had fever.  He reports some cough from drainage but notes this is typical for him at baseline due to allergies.  He has tried nasal rinses and nasal spray without significant relief.  The history is provided by the patient.    Past Medical History:  Diagnosis Date   Arthritis    Atypical nevus 04/20/2004   left low back (wider shave)   Basal cell carcinoma 01/16/1995   left back (cx3 84f exc)   BCC (basal cell carcinoma of skin) 10/13/2002   left supra brow (cx3 fu exc)   BCC (basal cell carcinoma of skin) 04/20/2004   center upper back (cx3 552f   BCC (basal cell carcinoma of skin) 04/24/2006   upper mid back (cx3 53f48f  BCC (basal cell carcinoma of skin) 10/28/2012   left upper back (cx3 53fu60f BCC (basal cell carcinoma of skin) 10/28/2012   right back upper (cx3 53fu)86fBCC (basal cell carcinoma of skin) 10/28/2012   right deltoid (cx3 53fu) 80fCC (basal cell carcinoma of skin) 10/25/2015   right chest no treatment basaliod neoplasm   DJD (degenerative joint disease)    GERD (gastroesophageal reflux disease)    Hydrocele    Hypertension    Right hydrocele 2021   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   left temple (cx3 53fu)  53fasonal allergies     Patient Active Problem List   Diagnosis Date Noted   Primary osteoarthritis of right hip 11/15/2020   Status post total replacement of right hip 11/15/2020   Vertigo    Coronary artery disease due to calcified coronary lesion 07/02/2018   Essential hypertension 07/02/2018   Pure  hypercholesterolemia 07/02/2018    Past Surgical History:  Procedure Laterality Date   APPENDECTOMY     hemorrhaged post op   [place]   DIAGNOSTIC LAPAROSCOPY     HERNIA REPAIR     umbilical   HYDROCELE EXCISION Right 07/14/2019   Procedure: HYDROCELECTOMY ADULT;  Surgeon: OttelinKathie Rhodes[place];  Laterality: Right;   KNEE ARTHROSCOPY  04/03/2008   lt   LUMBAR LAMINECTOMY  04/03/2006   SEPTOPLASTY     TONSILLECTOMY Bilateral 1970   TOTAL HIP ARTHROPLASTY Right 11/15/2020   Procedure: RIGHT TOTAL HIP ARTHROPLASTY ANTERIOR APPROACH;  Surgeon: Xu, NaiLeandrew KoyanagiLocation: [facility]; [place];  Laterality: Right;  3-C       Home Medications    Prior to Admission medications   Medication Sig Start Date End Date Taking? Authorizing Provider  amoxicillin-clavulanate (AUGMENTIN) 875-125 MG tablet Take 1 tablet by mouth every 12 (twelve) hours. 11/04/21  Yes Alanah Sakuma, Francene Finders acetaminophen (TYLENOL) 500 MG tablet Take 500 mg by mouth every 6 (six) hours as needed for moderate pain. Patient not taking: Reported on 10/03/2021    [provider]  amoxicillin (AMOXIL)  500 MG capsule Take 4 pills one hour prior to dental work. Patient not taking: Reported on 10/03/2021 04/29/21   Aundra Dubin, PA-C  aspirin EC 81 MG tablet Take 1 tablet (81 mg total) by mouth 2 (two) times daily. To be taken after surgery Patient not taking: Reported on 10/03/2021 11/08/20   Aundra Dubin, PA-C  Cholecalciferol (VITAMIN D) 50 MCG (2000 UT) CAPS Take 2,000 Units by mouth daily.    [provider]  Coenzyme Q10 (COQ10 PO) Take 300 mg by mouth daily.    [provider]  fluticasone (FLONASE) 50 MCG/ACT nasal spray Place 2 sprays into both nostrils daily.    [provider]  ibuprofen (ADVIL) 200 MG tablet Take 200 mg by mouth every 6 (six) hours as needed for moderate  pain. Patient not taking: Reported on 10/03/2021    [provider]  methocarbamol (ROBAXIN) 500 MG tablet Take 500 mg by mouth as needed. Patient not taking: Reported on 10/03/2021 04/13/21   [provider]  olmesartan (BENICAR) 20 MG tablet Take 20 mg by mouth daily.    [provider]  rosuvastatin (CRESTOR) 40 MG tablet Take 1 tablet (40 mg total) by mouth daily. 10/03/21   Lelon Perla, MD    Family History Family History  Family history unknown: Yes    Social History Social History   Tobacco Use   Smoking status: Former    Types: Cigarettes    Start date: 04/03/1972    Quit date: 04/03/1973    Years since quitting: 48.6   Smokeless tobacco: Never  Vaping Use   Vaping Use: Never used  Substance Use Topics   Alcohol use: Not Currently   Drug use: No     Allergies   Patient has no known allergies.   Review of Systems Review of Systems  Constitutional:  Positive for chills. Negative for fever.  HENT:  Positive for congestion, ear pain, postnasal drip, sinus pressure, sinus pain and sore throat.   Eyes:  Negative for discharge and redness.  Respiratory:  Positive for cough. Negative for shortness of breath.   Gastrointestinal:  Negative for abdominal pain, nausea and vomiting.     Physical Exam Triage Vital Signs ED Triage Vitals  Enc Vitals Group     BP      Pulse      Resp      Temp      Temp src      SpO2      Weight      Height      Head Circumference      Peak Flow      Pain Score      Pain Loc      Pain Edu?      Excl. in [place]?    No data found.  Updated Vital Signs BP 136/73 (BP Location: Left Arm)   Pulse 91   Temp 97.8 F (36.6 C) (Oral)   Resp 17   SpO2 96%   Physical Exam Vitals and nursing note reviewed.  Constitutional:      General: He is not in acute distress.    Appearance: He is well-developed. He is not ill-appearing.  HENT:     Head: Normocephalic and atraumatic.     Ears:     Comments: Bilateral  TMs dull with injection- right worse than left    Nose: Congestion present.     Mouth/Throat:     Mouth: Mucous membranes  are moist.     Pharynx: No oropharyngeal exudate or posterior oropharyngeal erythema.  Eyes:     Conjunctiva/sclera: Conjunctivae normal.  Cardiovascular:     Rate and Rhythm: Normal rate and regular rhythm.     Heart sounds: Normal heart sounds. No murmur heard. Pulmonary:     Effort: Pulmonary effort is normal. No respiratory distress.     Breath sounds: Normal breath sounds. No wheezing, rhonchi or rales.  Skin:    General: Skin is warm and dry.  Neurological:     Mental Status: He is alert.  Psychiatric:        Mood and Affect: Mood normal.        Behavior: Behavior normal.      UC Treatments / Results  Labs (all labs ordered are listed, but only abnormal results are displayed) Labs Reviewed - No data to display  EKG   Radiology No results found.  Procedures Procedures (including critical care time)  Medications Ordered in UC Medications - No data to display  Initial Impression / Assessment and Plan / UC Course  I have reviewed the triage vital signs and the nursing notes.  Pertinent labs & imaging results that were available during my care of the patient were reviewed by me and considered in my medical decision making (see chart for details).    Antibiotics prescribed to cover sinusitis. Encouraged him to continue nasal spray for added relief. Recommended follow-up if no improvement or if symptoms worsen anyway.  Final Clinical Impressions(s) / UC Diagnoses   Final diagnoses:  Acute frontal sinusitis, recurrence not specified   Discharge Instructions   None    ED Prescriptions     Medication Sig Dispense Auth. Provider   amoxicillin-clavulanate (AUGMENTIN) 875-125 MG tablet Take 1 tablet by mouth every 12 (twelve) hours. 14 tablet Francene Finders, PA-C      PDMP not reviewed this encounter.   Francene Finders, PA-C 11/04/21  0839    Francene Finders, PA-C 11/04/21 [phone]

## 2021-11-04 NOTE — ED Triage Notes (Signed)
Pt presents with right ear fullness, sinus pain and congestion for over a week.

## 2021-11-11 DIAGNOSIS — H8110 Benign paroxysmal vertigo, unspecified ear: Secondary | ICD-10-CM | POA: Diagnosis not present

## 2021-11-11 DIAGNOSIS — J302 Other seasonal allergic rhinitis: Secondary | ICD-10-CM | POA: Diagnosis not present

## 2021-11-11 DIAGNOSIS — J0110 Acute frontal sinusitis, unspecified: Secondary | ICD-10-CM | POA: Diagnosis not present

## 2021-11-15 ENCOUNTER — Ambulatory Visit: Payer: Medicare PPO | Admitting: Orthopaedic Surgery

## 2021-11-15 ENCOUNTER — Ambulatory Visit (INDEPENDENT_AMBULATORY_CARE_PROVIDER_SITE_OTHER): Payer: Medicare PPO

## 2021-11-15 DIAGNOSIS — Z96641 Presence of right artificial hip joint: Secondary | ICD-10-CM | POA: Diagnosis not present

## 2021-11-15 NOTE — Progress Notes (Signed)
Post-Op Visit Note   Patient: Adrian [name]           Date of Birth: 1950/07/11           MRN: [id number] Visit Date: 11/15/2021 PCP: Adrian Organ., MD   Assessment & Plan:  Chief Complaint:  Chief Complaint  Patient presents with   Right Hip - Pain   Visit Diagnoses:  1. Status post total replacement of right hip     Plan: Adrian [name] is 1 year status post right hip replacement on 11/15/2020.  Doing really well.  Has been doing tai chi.  Has not taken any pain medication in over 4 months.  He is very happy with how everything is going.  Examination of right hip shows a fully healed surgical scar. Range of motion and circumduction.  Normal gait.  Implant looks stable on x-rays without any evidence of complications.  Adrian [name] has done very well from his surgery.  Continue activity as tolerated.  Continue dental prophylaxis for 1 more year.  Recheck in another year with standing AP pelvis and lateral hip x-rays on return.  Follow-Up Instructions: Return in about 1 year (around 11/16/2022).   Orders:  Orders Placed This Encounter  Procedures   XR Pelvis 1-2 Views   No orders of the defined types were placed in this encounter.   Imaging: XR Pelvis 1-2 Views  Result Date: 11/15/2021 Stable total hip replacement without complications   PMFS History: Patient Active Problem List   Diagnosis Date Noted   Primary osteoarthritis of right hip 11/15/2020   Status post total replacement of right hip 11/15/2020   Vertigo    Coronary artery disease due to calcified coronary lesion 07/02/2018   Essential hypertension 07/02/2018   Pure hypercholesterolemia 07/02/2018   Past Medical History:  Diagnosis Date   Arthritis    Atypical nevus 04/20/2004   left low back (wider shave)   Basal cell carcinoma 01/16/1995   left back (cx3 31f exc)   BCC (basal cell carcinoma of skin) 10/13/2002   left supra brow (cx3 fu exc)   BCC (basal cell carcinoma of skin) 04/20/2004   center upper  back (cx3 560f   BCC (basal cell carcinoma of skin) 04/24/2006   upper mid back (cx3 86f81f  BCC (basal cell carcinoma of skin) 10/28/2012   left upper back (cx3 86fu186f BCC (basal cell carcinoma of skin) 10/28/2012   right back upper (cx3 86fu)61fBCC (basal cell carcinoma of skin) 10/28/2012   right deltoid (cx3 86fu) 386fCC (basal cell carcinoma of skin) 10/25/2015   right chest no treatment basaliod neoplasm   DJD (degenerative joint disease)    GERD (gastroesophageal reflux disease)    Hydrocele    Hypertension    Right hydrocele 2021   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   left temple (cx3 86fu)  21fasonal allergies     Family History  Family history unknown: Yes    Past Surgical History:  Procedure Laterality Date   APPENDECTOMY     hemorrhaged post op   BACK [place]  04/03/1993   DIAGNOSTIC LAPAROSCOPY     HERNIA REPAIR     umbilical   HYDROCELE EXCISION Right 07/14/2019   Procedure: HYDROCELECTOMY ADULT;  Surgeon: Ottelin[place]athie Rhodes[facility] [place];  Laterality: Right;   KNEE ARTHROSCOPY  04/03/2008   lt  LUMBAR LAMINECTOMY  04/03/2006   SEPTOPLASTY     TONSILLECTOMY Bilateral 1970   TOTAL HIP ARTHROPLASTY Right 11/15/2020   Procedure: RIGHT TOTAL HIP ARTHROPLASTY ANTERIOR APPROACH;  Surgeon: Adrian Koyanagi, MD;  Location: [place];  Service: Orthopedics;  Laterality: Right;  3-C   Social History   Occupational History   Not on file  Tobacco Use   Smoking status: Former    Types: Cigarettes    Start date: 04/03/1972    Quit date: 04/03/1973    Years since quitting: 48.6   Smokeless tobacco: Never  Vaping Use   Vaping Use: Never used  Substance and Sexual Activity   Alcohol use: Not Currently   Drug use: No   Sexual activity: Yes

## 2021-12-22 DIAGNOSIS — J329 Chronic sinusitis, unspecified: Secondary | ICD-10-CM | POA: Diagnosis not present

## 2021-12-22 DIAGNOSIS — H9201 Otalgia, right ear: Secondary | ICD-10-CM | POA: Diagnosis not present

## 2021-12-22 DIAGNOSIS — Z1152 Encounter for screening for COVID-19: Secondary | ICD-10-CM | POA: Diagnosis not present

## 2021-12-22 DIAGNOSIS — H6981 Other specified disorders of Eustachian tube, right ear: Secondary | ICD-10-CM | POA: Diagnosis not present

## 2021-12-22 DIAGNOSIS — R42 Dizziness and giddiness: Secondary | ICD-10-CM | POA: Diagnosis not present

## 2021-12-22 DIAGNOSIS — J029 Acute pharyngitis, unspecified: Secondary | ICD-10-CM | POA: Diagnosis not present

## 2021-12-22 DIAGNOSIS — J309 Allergic rhinitis, unspecified: Secondary | ICD-10-CM | POA: Diagnosis not present

## 2022-01-03 DIAGNOSIS — Z23 Encounter for immunization: Secondary | ICD-10-CM | POA: Diagnosis not present

## 2022-01-03 DIAGNOSIS — R42 Dizziness and giddiness: Secondary | ICD-10-CM | POA: Diagnosis not present

## 2022-01-03 DIAGNOSIS — H8110 Benign paroxysmal vertigo, unspecified ear: Secondary | ICD-10-CM | POA: Diagnosis not present

## 2022-01-03 DIAGNOSIS — I2510 Atherosclerotic heart disease of native coronary artery without angina pectoris: Secondary | ICD-10-CM | POA: Diagnosis not present

## 2022-01-03 DIAGNOSIS — M546 Pain in thoracic spine: Secondary | ICD-10-CM | POA: Diagnosis not present

## 2022-01-11 ENCOUNTER — Other Ambulatory Visit: Payer: Self-pay | Admitting: Orthopaedic Surgery

## 2022-01-25 DIAGNOSIS — Z9109 Other allergy status, other than to drugs and biological substances: Secondary | ICD-10-CM | POA: Diagnosis not present

## 2022-01-25 DIAGNOSIS — R42 Dizziness and giddiness: Secondary | ICD-10-CM | POA: Diagnosis not present

## 2022-01-25 DIAGNOSIS — H903 Sensorineural hearing loss, bilateral: Secondary | ICD-10-CM | POA: Diagnosis not present

## 2022-02-03 DIAGNOSIS — J309 Allergic rhinitis, unspecified: Secondary | ICD-10-CM | POA: Diagnosis not present

## 2022-02-03 DIAGNOSIS — I10 Essential (primary) hypertension: Secondary | ICD-10-CM | POA: Diagnosis not present

## 2022-02-03 DIAGNOSIS — J069 Acute upper respiratory infection, unspecified: Secondary | ICD-10-CM | POA: Diagnosis not present

## 2022-02-03 DIAGNOSIS — J329 Chronic sinusitis, unspecified: Secondary | ICD-10-CM | POA: Diagnosis not present

## 2022-02-03 DIAGNOSIS — R051 Acute cough: Secondary | ICD-10-CM | POA: Diagnosis not present

## 2022-02-07 DIAGNOSIS — I10 Essential (primary) hypertension: Secondary | ICD-10-CM | POA: Diagnosis not present

## 2022-02-07 DIAGNOSIS — R946 Abnormal results of thyroid function studies: Secondary | ICD-10-CM | POA: Diagnosis not present

## 2022-02-07 DIAGNOSIS — R7301 Impaired fasting glucose: Secondary | ICD-10-CM | POA: Diagnosis not present

## 2022-02-07 DIAGNOSIS — E785 Hyperlipidemia, unspecified: Secondary | ICD-10-CM | POA: Diagnosis not present

## 2022-02-07 DIAGNOSIS — R7989 Other specified abnormal findings of blood chemistry: Secondary | ICD-10-CM | POA: Diagnosis not present

## 2022-02-07 DIAGNOSIS — Z125 Encounter for screening for malignant neoplasm of prostate: Secondary | ICD-10-CM | POA: Diagnosis not present

## 2022-02-09 DIAGNOSIS — R7301 Impaired fasting glucose: Secondary | ICD-10-CM | POA: Diagnosis not present

## 2022-02-09 DIAGNOSIS — I2510 Atherosclerotic heart disease of native coronary artery without angina pectoris: Secondary | ICD-10-CM | POA: Diagnosis not present

## 2022-02-09 DIAGNOSIS — H8110 Benign paroxysmal vertigo, unspecified ear: Secondary | ICD-10-CM | POA: Diagnosis not present

## 2022-02-09 DIAGNOSIS — R972 Elevated prostate specific antigen [PSA]: Secondary | ICD-10-CM | POA: Diagnosis not present

## 2022-02-09 DIAGNOSIS — Z800 Family history of malignant neoplasm of digestive organs: Secondary | ICD-10-CM | POA: Diagnosis not present

## 2022-02-09 DIAGNOSIS — R42 Dizziness and giddiness: Secondary | ICD-10-CM | POA: Diagnosis not present

## 2022-02-09 DIAGNOSIS — Z0000 Encounter for general adult medical examination without abnormal findings: Secondary | ICD-10-CM | POA: Diagnosis not present

## 2022-02-09 DIAGNOSIS — I10 Essential (primary) hypertension: Secondary | ICD-10-CM | POA: Diagnosis not present

## 2022-02-09 DIAGNOSIS — R82998 Other abnormal findings in urine: Secondary | ICD-10-CM | POA: Diagnosis not present

## 2022-02-09 DIAGNOSIS — E785 Hyperlipidemia, unspecified: Secondary | ICD-10-CM | POA: Diagnosis not present

## 2022-02-09 DIAGNOSIS — Z1331 Encounter for screening for depression: Secondary | ICD-10-CM | POA: Diagnosis not present

## 2022-02-09 DIAGNOSIS — Z1339 Encounter for screening examination for other mental health and behavioral disorders: Secondary | ICD-10-CM | POA: Diagnosis not present

## 2022-04-13 DIAGNOSIS — H04123 Dry eye syndrome of bilateral lacrimal glands: Secondary | ICD-10-CM | POA: Diagnosis not present

## 2022-04-13 DIAGNOSIS — H25813 Combined forms of age-related cataract, bilateral: Secondary | ICD-10-CM | POA: Diagnosis not present

## 2022-04-13 DIAGNOSIS — H40033 Anatomical narrow angle, bilateral: Secondary | ICD-10-CM | POA: Diagnosis not present

## 2022-04-13 DIAGNOSIS — H524 Presbyopia: Secondary | ICD-10-CM | POA: Diagnosis not present

## 2022-04-13 DIAGNOSIS — H40013 Open angle with borderline findings, low risk, bilateral: Secondary | ICD-10-CM | POA: Diagnosis not present

## 2022-05-13 IMAGING — MR MR CERVICAL SPINE W/O CM
5 series · 34 of 48 positions shown · non-contrast
Comparison: 04/29/2011

CLINICAL DATA: Neck pain with bilateral shoulder pain extending
down the right arm with weakness and numbness reaching the fingers.

EXAM:
MRI CERVICAL SPINE WITHOUT CONTRAST
TECHNIQUE: Multiplanar, multisequence MR imaging of the cervical spine was
performed. No intravenous contrast was administered.

[Series 2: T2 · sagittal · 3.0mm · 0.41mm/px · 8 of 17 slices shown (1 of 2)]
[im 1/17]
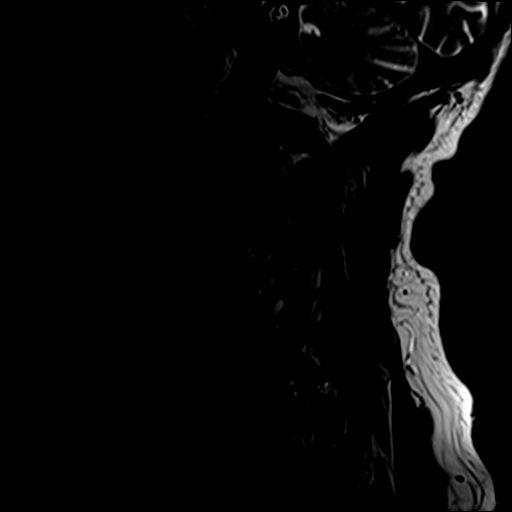
[im 3/17]
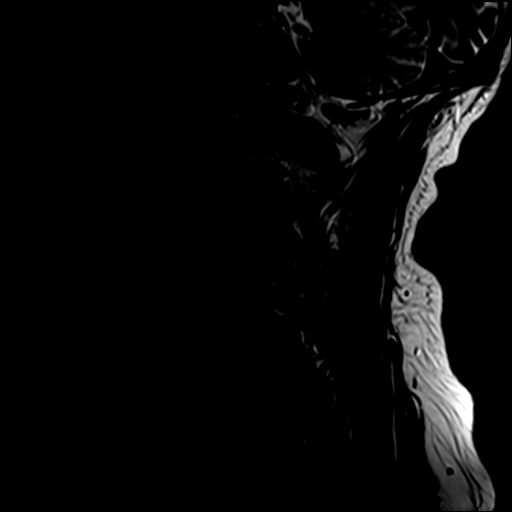
[im 5/17]
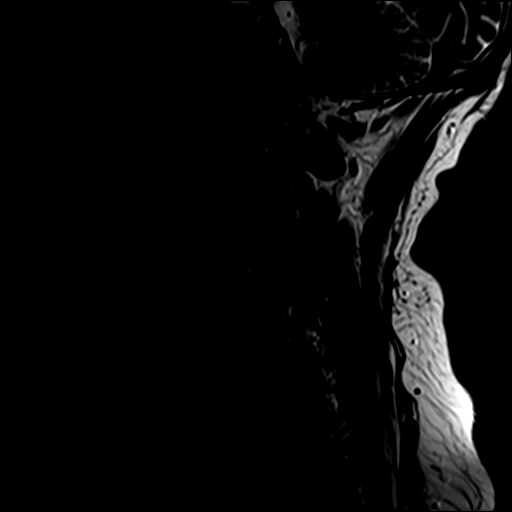
[im 7/17]
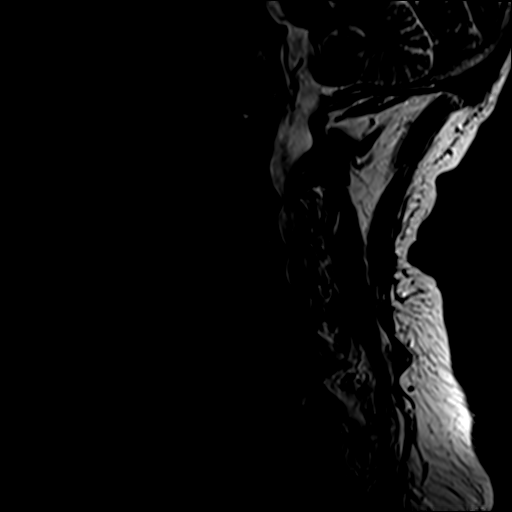
[im 10/17]
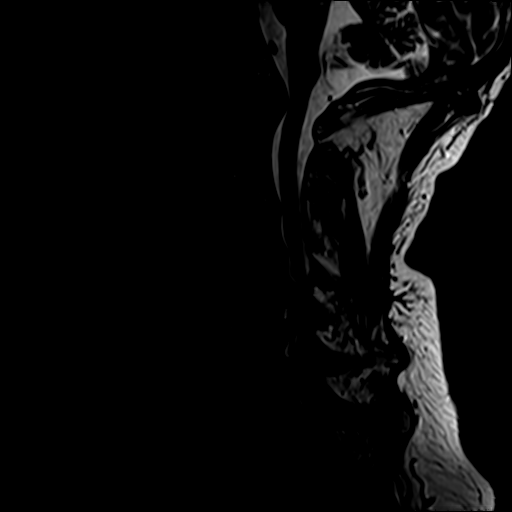
[im 12/17]
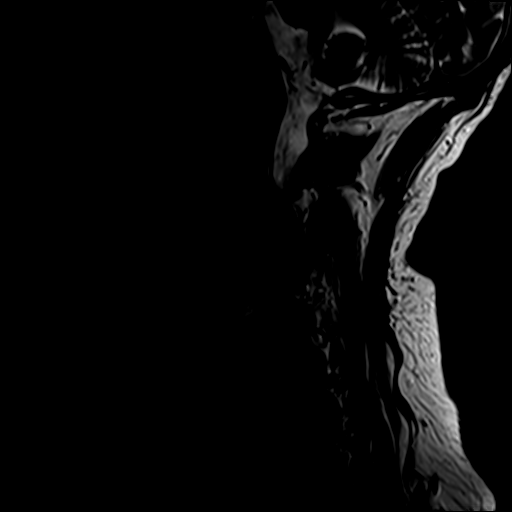
[im 14/17]
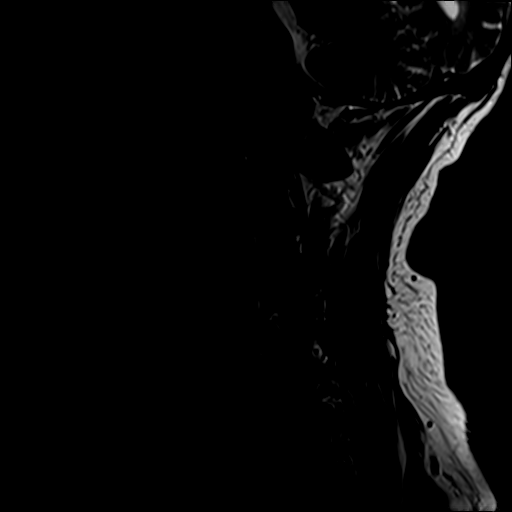
[im 17/17]
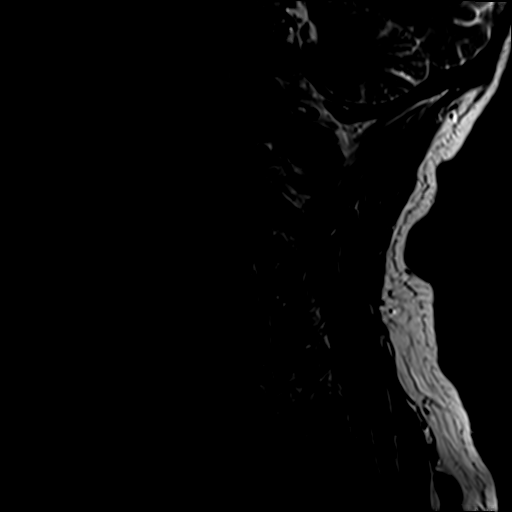

[Series 3: STIR · sagittal · 3.0mm · 0.82mm/px · 7 of 17 slices shown]
[im 1/17]
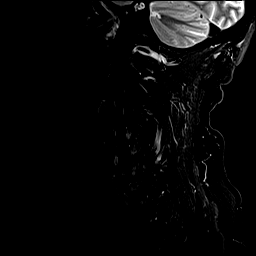
[im 3/17]
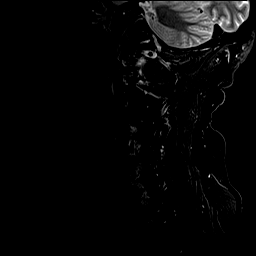
[im 6/17]
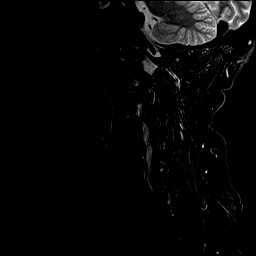
[im 9/17]
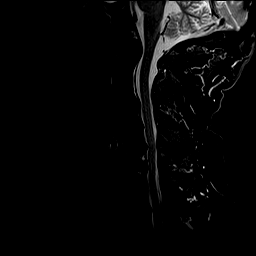
[im 11/17]
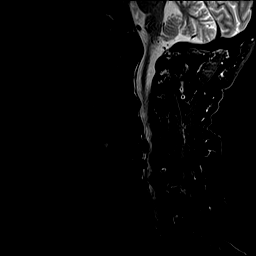
[im 14/17]
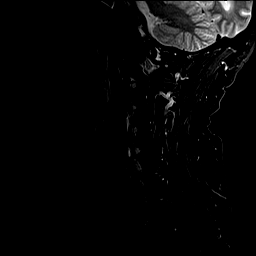
[im 17/17]
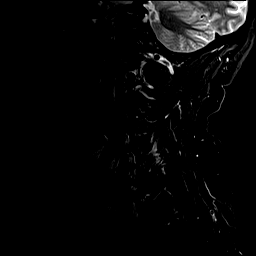

[Series 4: T1 · sagittal · 3.0mm · 0.82mm/px · 7 of 17 slices shown]
[im 1/17]
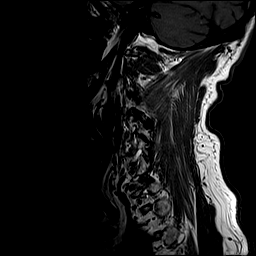
[im 3/17]
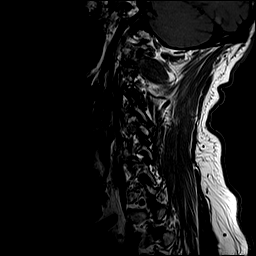
[im 6/17]
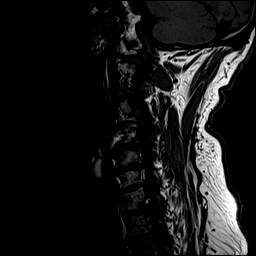
[im 9/17]
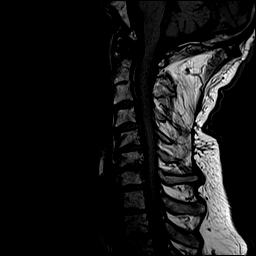
[im 11/17]
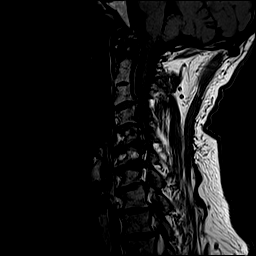
[im 14/17]
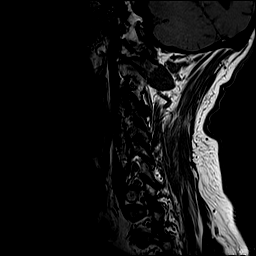
[im 17/17]
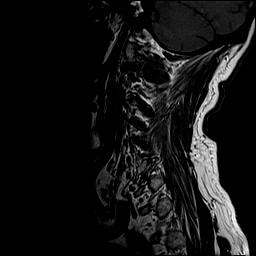

[Series 5: T2 · axial · 3.0mm · 0.70mm/px · z∈[-64,+48]mm · 9 of 32 slices shown (2 of 2)]
[im 1/32]
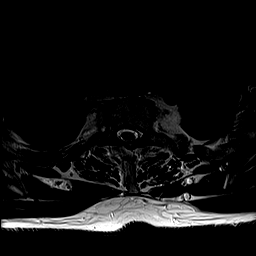
[im 6/32]
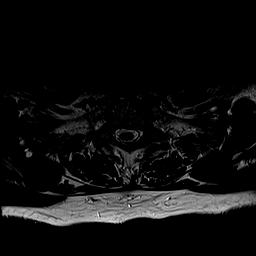
[im 11/32]
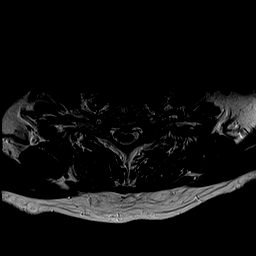
[im 13/32]
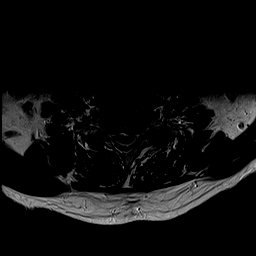
[im 16/32]
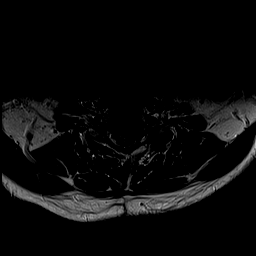
[im 19/32]
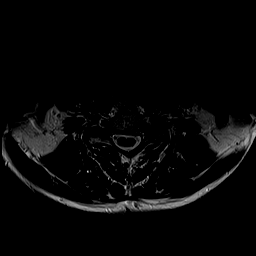
[im 21/32]
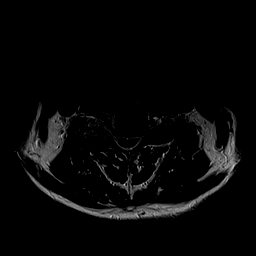
[im 26/32]
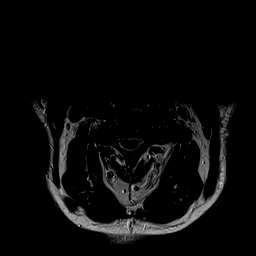
[im 32/32]
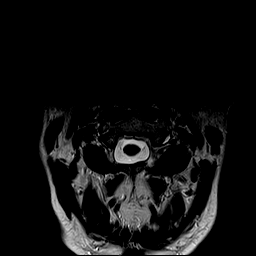

[Series 6: GRE · axial · 3.0mm · 0.35mm/px · z∈[-64,-28]mm · 3 of 32 slices shown]
[im 1/32]
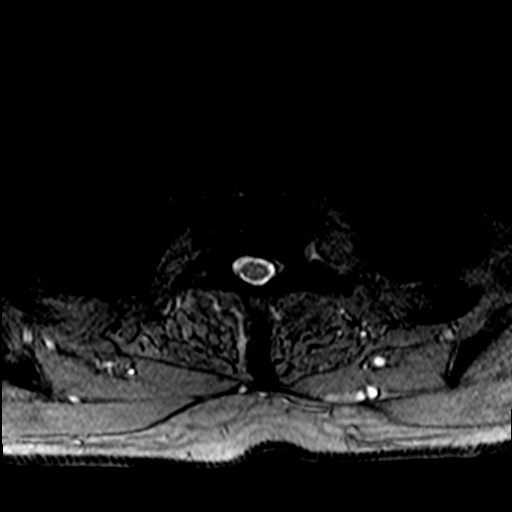
[im 6/32]
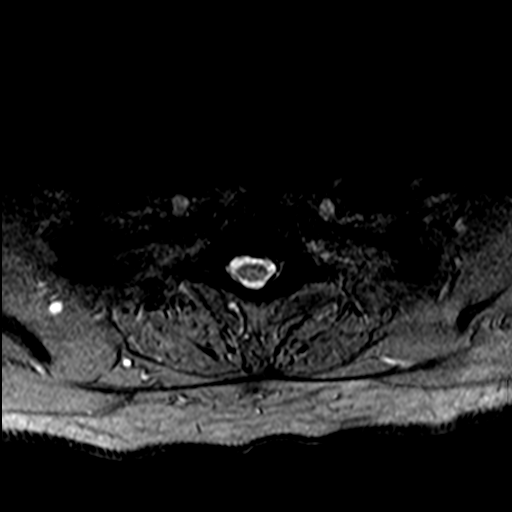
[im 11/32]
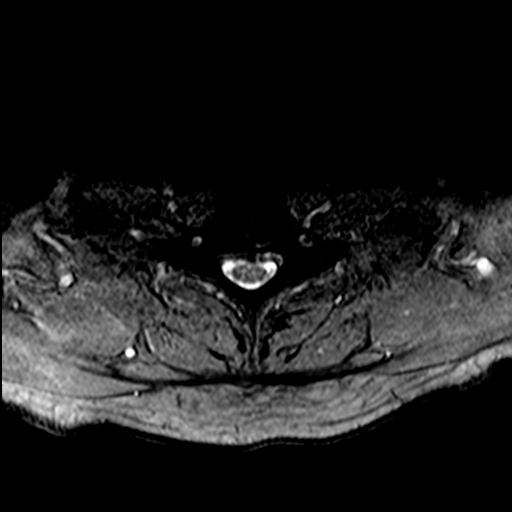

[34 of 48 positions shown; findings below may reference images not displayed]

FINDINGS: Alignment: Straightening of cervical lordosis. Mild degenerative
anterolisthesis at C4-5

Vertebrae: C2-3 non segmentation. No fracture, discitis, or
aggressive bone lesion

Cord: Normal signal and morphology

Posterior Fossa, vertebral arteries, paraspinal tissues: Negative

Disc levels:

C2-3: Non segmentation.  No impingement

C3-4: Bulky degenerative facet spurring especially on the left.
Bulky bilateral uncovertebral spurring. Severe bilateral foraminal
impingement

C4-5: Facet degeneration with spurring and anterolisthesis. The disc
is narrowed and bulging with posterior disc osteophyte complex.
Advanced left foraminal impingement. Patent right foramen

C5-6: Disc narrowing and bulging with left paracentral to foraminal
protrusion. Degenerative facet spurring asymmetric to the left.
Severe left foraminal impingement. Moderate to advanced right
foraminal stenosis. Disc material and osteophyte flattens the left
cord.

C6-7: Disc narrowing and bulging with uncovertebral spurring.
Advanced bilateral foraminal impingement. Mild spinal stenosis

C7-T1:Facet spurring and uncovertebral ridging bilaterally.
Bilateral foraminal impingement

T1-2 and T2-3: Disc narrowing and bulging with facet spurring.
Advanced bilateral foraminal impingement. Mild spinal stenosis.
IMPRESSION: 1. C2-3 non segmentation. Otherwise diffuse and advanced
degeneration with advanced bilateral foraminal impingement from C3-4
to T2-3, only sparing the right foramen at C4-5.
2. Spinal stenosis most notable at C5-6 where there is left-sided
cord mass flattening.

## 2022-06-02 DIAGNOSIS — M9902 Segmental and somatic dysfunction of thoracic region: Secondary | ICD-10-CM | POA: Diagnosis not present

## 2022-06-02 DIAGNOSIS — H814 Vertigo of central origin: Secondary | ICD-10-CM | POA: Diagnosis not present

## 2022-06-02 DIAGNOSIS — M9901 Segmental and somatic dysfunction of cervical region: Secondary | ICD-10-CM | POA: Diagnosis not present

## 2022-06-02 DIAGNOSIS — G4486 Cervicogenic headache: Secondary | ICD-10-CM | POA: Diagnosis not present

## 2022-06-05 DIAGNOSIS — M5383 Other specified dorsopathies, cervicothoracic region: Secondary | ICD-10-CM | POA: Diagnosis not present

## 2022-06-05 DIAGNOSIS — M9905 Segmental and somatic dysfunction of pelvic region: Secondary | ICD-10-CM | POA: Diagnosis not present

## 2022-06-05 DIAGNOSIS — H814 Vertigo of central origin: Secondary | ICD-10-CM | POA: Diagnosis not present

## 2022-06-05 DIAGNOSIS — M9903 Segmental and somatic dysfunction of lumbar region: Secondary | ICD-10-CM | POA: Diagnosis not present

## 2022-06-05 DIAGNOSIS — M9901 Segmental and somatic dysfunction of cervical region: Secondary | ICD-10-CM | POA: Diagnosis not present

## 2022-06-05 DIAGNOSIS — G4486 Cervicogenic headache: Secondary | ICD-10-CM | POA: Diagnosis not present

## 2022-06-05 DIAGNOSIS — M9902 Segmental and somatic dysfunction of thoracic region: Secondary | ICD-10-CM | POA: Diagnosis not present

## 2022-06-08 DIAGNOSIS — H814 Vertigo of central origin: Secondary | ICD-10-CM | POA: Diagnosis not present

## 2022-06-08 DIAGNOSIS — M9902 Segmental and somatic dysfunction of thoracic region: Secondary | ICD-10-CM | POA: Diagnosis not present

## 2022-06-08 DIAGNOSIS — G4486 Cervicogenic headache: Secondary | ICD-10-CM | POA: Diagnosis not present

## 2022-06-08 DIAGNOSIS — M9901 Segmental and somatic dysfunction of cervical region: Secondary | ICD-10-CM | POA: Diagnosis not present

## 2022-06-12 DIAGNOSIS — L821 Other seborrheic keratosis: Secondary | ICD-10-CM | POA: Diagnosis not present

## 2022-06-12 DIAGNOSIS — L570 Actinic keratosis: Secondary | ICD-10-CM | POA: Diagnosis not present

## 2022-06-12 DIAGNOSIS — D492 Neoplasm of unspecified behavior of bone, soft tissue, and skin: Secondary | ICD-10-CM | POA: Diagnosis not present

## 2022-06-12 DIAGNOSIS — L738 Other specified follicular disorders: Secondary | ICD-10-CM | POA: Diagnosis not present

## 2022-06-12 DIAGNOSIS — Z85828 Personal history of other malignant neoplasm of skin: Secondary | ICD-10-CM | POA: Diagnosis not present

## 2022-06-12 DIAGNOSIS — C44619 Basal cell carcinoma of skin of left upper limb, including shoulder: Secondary | ICD-10-CM | POA: Diagnosis not present

## 2022-06-12 DIAGNOSIS — L814 Other melanin hyperpigmentation: Secondary | ICD-10-CM | POA: Diagnosis not present

## 2022-06-12 DIAGNOSIS — Z7189 Other specified counseling: Secondary | ICD-10-CM | POA: Diagnosis not present

## 2022-06-12 DIAGNOSIS — D225 Melanocytic nevi of trunk: Secondary | ICD-10-CM | POA: Diagnosis not present

## 2022-06-12 DIAGNOSIS — Z08 Encounter for follow-up examination after completed treatment for malignant neoplasm: Secondary | ICD-10-CM | POA: Diagnosis not present

## 2022-06-13 DIAGNOSIS — M4606 Spinal enthesopathy, lumbar region: Secondary | ICD-10-CM | POA: Diagnosis not present

## 2022-06-13 DIAGNOSIS — M9901 Segmental and somatic dysfunction of cervical region: Secondary | ICD-10-CM | POA: Diagnosis not present

## 2022-06-13 DIAGNOSIS — M9905 Segmental and somatic dysfunction of pelvic region: Secondary | ICD-10-CM | POA: Diagnosis not present

## 2022-06-13 DIAGNOSIS — H814 Vertigo of central origin: Secondary | ICD-10-CM | POA: Diagnosis not present

## 2022-06-13 DIAGNOSIS — M9902 Segmental and somatic dysfunction of thoracic region: Secondary | ICD-10-CM | POA: Diagnosis not present

## 2022-06-13 DIAGNOSIS — G4486 Cervicogenic headache: Secondary | ICD-10-CM | POA: Diagnosis not present

## 2022-06-13 DIAGNOSIS — M9903 Segmental and somatic dysfunction of lumbar region: Secondary | ICD-10-CM | POA: Diagnosis not present

## 2022-06-16 DIAGNOSIS — M9902 Segmental and somatic dysfunction of thoracic region: Secondary | ICD-10-CM | POA: Diagnosis not present

## 2022-06-16 DIAGNOSIS — M9905 Segmental and somatic dysfunction of pelvic region: Secondary | ICD-10-CM | POA: Diagnosis not present

## 2022-06-16 DIAGNOSIS — M4606 Spinal enthesopathy, lumbar region: Secondary | ICD-10-CM | POA: Diagnosis not present

## 2022-06-16 DIAGNOSIS — M9901 Segmental and somatic dysfunction of cervical region: Secondary | ICD-10-CM | POA: Diagnosis not present

## 2022-06-16 DIAGNOSIS — H814 Vertigo of central origin: Secondary | ICD-10-CM | POA: Diagnosis not present

## 2022-06-16 DIAGNOSIS — M9903 Segmental and somatic dysfunction of lumbar region: Secondary | ICD-10-CM | POA: Diagnosis not present

## 2022-06-16 DIAGNOSIS — M5383 Other specified dorsopathies, cervicothoracic region: Secondary | ICD-10-CM | POA: Diagnosis not present

## 2022-06-16 DIAGNOSIS — G4486 Cervicogenic headache: Secondary | ICD-10-CM | POA: Diagnosis not present

## 2022-06-19 DIAGNOSIS — M9902 Segmental and somatic dysfunction of thoracic region: Secondary | ICD-10-CM | POA: Diagnosis not present

## 2022-06-19 DIAGNOSIS — M9901 Segmental and somatic dysfunction of cervical region: Secondary | ICD-10-CM | POA: Diagnosis not present

## 2022-06-19 DIAGNOSIS — H814 Vertigo of central origin: Secondary | ICD-10-CM | POA: Diagnosis not present

## 2022-06-19 DIAGNOSIS — G4486 Cervicogenic headache: Secondary | ICD-10-CM | POA: Diagnosis not present

## 2022-06-23 DIAGNOSIS — G4486 Cervicogenic headache: Secondary | ICD-10-CM | POA: Diagnosis not present

## 2022-06-23 DIAGNOSIS — M9901 Segmental and somatic dysfunction of cervical region: Secondary | ICD-10-CM | POA: Diagnosis not present

## 2022-06-23 DIAGNOSIS — M9902 Segmental and somatic dysfunction of thoracic region: Secondary | ICD-10-CM | POA: Diagnosis not present

## 2022-06-23 DIAGNOSIS — H814 Vertigo of central origin: Secondary | ICD-10-CM | POA: Diagnosis not present

## 2022-07-05 DIAGNOSIS — G4486 Cervicogenic headache: Secondary | ICD-10-CM | POA: Diagnosis not present

## 2022-07-05 DIAGNOSIS — H814 Vertigo of central origin: Secondary | ICD-10-CM | POA: Diagnosis not present

## 2022-07-05 DIAGNOSIS — M9902 Segmental and somatic dysfunction of thoracic region: Secondary | ICD-10-CM | POA: Diagnosis not present

## 2022-07-05 DIAGNOSIS — M9901 Segmental and somatic dysfunction of cervical region: Secondary | ICD-10-CM | POA: Diagnosis not present

## 2022-07-10 DIAGNOSIS — M9902 Segmental and somatic dysfunction of thoracic region: Secondary | ICD-10-CM | POA: Diagnosis not present

## 2022-07-10 DIAGNOSIS — G4486 Cervicogenic headache: Secondary | ICD-10-CM | POA: Diagnosis not present

## 2022-07-10 DIAGNOSIS — H814 Vertigo of central origin: Secondary | ICD-10-CM | POA: Diagnosis not present

## 2022-07-10 DIAGNOSIS — M9901 Segmental and somatic dysfunction of cervical region: Secondary | ICD-10-CM | POA: Diagnosis not present

## 2022-07-11 DIAGNOSIS — C4441 Basal cell carcinoma of skin of scalp and neck: Secondary | ICD-10-CM | POA: Diagnosis not present

## 2022-07-17 DIAGNOSIS — M9902 Segmental and somatic dysfunction of thoracic region: Secondary | ICD-10-CM | POA: Diagnosis not present

## 2022-07-17 DIAGNOSIS — H814 Vertigo of central origin: Secondary | ICD-10-CM | POA: Diagnosis not present

## 2022-07-17 DIAGNOSIS — M9901 Segmental and somatic dysfunction of cervical region: Secondary | ICD-10-CM | POA: Diagnosis not present

## 2022-07-17 DIAGNOSIS — G4486 Cervicogenic headache: Secondary | ICD-10-CM | POA: Diagnosis not present

## 2022-07-24 DIAGNOSIS — H814 Vertigo of central origin: Secondary | ICD-10-CM | POA: Diagnosis not present

## 2022-07-24 DIAGNOSIS — M9901 Segmental and somatic dysfunction of cervical region: Secondary | ICD-10-CM | POA: Diagnosis not present

## 2022-07-24 DIAGNOSIS — G4486 Cervicogenic headache: Secondary | ICD-10-CM | POA: Diagnosis not present

## 2022-07-24 DIAGNOSIS — M9902 Segmental and somatic dysfunction of thoracic region: Secondary | ICD-10-CM | POA: Diagnosis not present

## 2022-08-07 DIAGNOSIS — H814 Vertigo of central origin: Secondary | ICD-10-CM | POA: Diagnosis not present

## 2022-08-07 DIAGNOSIS — G4486 Cervicogenic headache: Secondary | ICD-10-CM | POA: Diagnosis not present

## 2022-08-07 DIAGNOSIS — M9901 Segmental and somatic dysfunction of cervical region: Secondary | ICD-10-CM | POA: Diagnosis not present

## 2022-08-07 DIAGNOSIS — M9902 Segmental and somatic dysfunction of thoracic region: Secondary | ICD-10-CM | POA: Diagnosis not present

## 2022-08-10 ENCOUNTER — Other Ambulatory Visit (HOSPITAL_COMMUNITY): Payer: Self-pay | Admitting: Adult Health

## 2022-08-10 ENCOUNTER — Ambulatory Visit (HOSPITAL_COMMUNITY)
Admission: RE | Admit: 2022-08-10 | Discharge: 2022-08-10 | Disposition: A | Discharge status: Home / Self Care | Payer: Medicare PPO | Source: Ambulatory Visit | Attending: Adult Health | Admitting: Adult Health

## 2022-08-10 DIAGNOSIS — K921 Melena: Secondary | ICD-10-CM | POA: Diagnosis not present

## 2022-08-10 DIAGNOSIS — D72825 Bandemia: Secondary | ICD-10-CM | POA: Insufficient documentation

## 2022-08-10 DIAGNOSIS — R1031 Right lower quadrant pain: Secondary | ICD-10-CM

## 2022-08-10 DIAGNOSIS — I10 Essential (primary) hypertension: Secondary | ICD-10-CM | POA: Diagnosis not present

## 2022-08-10 MED ORDER — IOHEXOL 350 MG/ML SOLN
75.0000 mL | Freq: Once | INTRAVENOUS | Status: AC | PRN
  Administered 2022-08-10: 75 mL via INTRAVENOUS

## 2022-08-21 DIAGNOSIS — H814 Vertigo of central origin: Secondary | ICD-10-CM | POA: Diagnosis not present

## 2022-08-21 DIAGNOSIS — M9901 Segmental and somatic dysfunction of cervical region: Secondary | ICD-10-CM | POA: Diagnosis not present

## 2022-08-21 DIAGNOSIS — G4486 Cervicogenic headache: Secondary | ICD-10-CM | POA: Diagnosis not present

## 2022-08-21 DIAGNOSIS — M9902 Segmental and somatic dysfunction of thoracic region: Secondary | ICD-10-CM | POA: Diagnosis not present

## 2022-09-25 DIAGNOSIS — H814 Vertigo of central origin: Secondary | ICD-10-CM | POA: Diagnosis not present

## 2022-09-25 DIAGNOSIS — G4486 Cervicogenic headache: Secondary | ICD-10-CM | POA: Diagnosis not present

## 2022-09-25 DIAGNOSIS — M9902 Segmental and somatic dysfunction of thoracic region: Secondary | ICD-10-CM | POA: Diagnosis not present

## 2022-09-25 DIAGNOSIS — M9901 Segmental and somatic dysfunction of cervical region: Secondary | ICD-10-CM | POA: Diagnosis not present

## 2022-11-07 DIAGNOSIS — M9902 Segmental and somatic dysfunction of thoracic region: Secondary | ICD-10-CM | POA: Diagnosis not present

## 2022-11-07 DIAGNOSIS — M9901 Segmental and somatic dysfunction of cervical region: Secondary | ICD-10-CM | POA: Diagnosis not present

## 2022-11-07 DIAGNOSIS — G4486 Cervicogenic headache: Secondary | ICD-10-CM | POA: Diagnosis not present

## 2022-11-07 DIAGNOSIS — H814 Vertigo of central origin: Secondary | ICD-10-CM | POA: Diagnosis not present

## 2022-11-16 ENCOUNTER — Other Ambulatory Visit (INDEPENDENT_AMBULATORY_CARE_PROVIDER_SITE_OTHER): Payer: Medicare PPO

## 2022-11-16 ENCOUNTER — Ambulatory Visit: Payer: Medicare PPO | Admitting: Physician Assistant

## 2022-11-16 ENCOUNTER — Encounter: Payer: Self-pay | Admitting: Physician Assistant

## 2022-11-16 DIAGNOSIS — Z96641 Presence of right artificial hip joint: Secondary | ICD-10-CM | POA: Diagnosis not present

## 2022-11-16 NOTE — Progress Notes (Signed)
Post-Op Visit Note   Patient: Adrian [name]           Date of Birth: Nov 19, 1950           MRN: [id number] Visit Date: 11/16/2022 PCP: Cleatis Polka., MD   Assessment & Plan:  Chief Complaint:  Chief Complaint  Patient presents with   Right Hip - Follow-up    Right total hip arthroplasty 11/15/2020   Visit Diagnoses:  1. Status post total replacement of right hip   2. History of hip replacement, total, right     Plan: Patient is a pleasant 72 year old gentleman who comes in today 2 years status post right total hip replacement 11/15/2020.  He has been doing really well in regards to the right hip.  He is having some right sided back pain however which is relieved with exercise and ibuprofen.  Overall, feeling well.  Examination of his right hip reveals painless hip flexion and logroll.  He is neurovascularly intact distally.  At this point, he will continue to advance with activity as tolerated.  Follow-up in the next 2 years or so for repeat evaluation and x-rays.  Call with concerns or questions.  Follow-Up Instructions: Return in about 2 years (around 11/15/2024).   Orders:  Orders Placed This Encounter  Procedures   XR Pelvis 1-2 Views   No orders of the defined types were placed in this encounter.   Imaging: XR Pelvis 1-2 Views  Result Date: 11/16/2022 Well-seated prosthesis without complication   PMFS History: Patient Active Problem List   Diagnosis Date Noted   Primary osteoarthritis of right hip 11/15/2020   Status post total replacement of right hip 11/15/2020   Vertigo    Coronary artery disease due to calcified coronary lesion 07/02/2018   Essential hypertension 07/02/2018   Pure hypercholesterolemia 07/02/2018   Past Medical History:  Diagnosis Date   Arthritis    Atypical nevus 04/20/2004   left low back (wider shave)   Basal cell carcinoma 01/16/1995   left back (cx3 79fu exc)   BCC (basal cell carcinoma of skin) 10/13/2002   left supra brow  (cx3 fu exc)   BCC (basal cell carcinoma of skin) 04/20/2004   center upper back (cx3 55fu)   BCC (basal cell carcinoma of skin) 04/24/2006   upper mid back (cx3 68fu)   BCC (basal cell carcinoma of skin) 10/28/2012   left upper back (cx3 85fu)   BCC (basal cell carcinoma of skin) 10/28/2012   right back upper (cx3 66fu)   BCC (basal cell carcinoma of skin) 10/28/2012   right deltoid (cx3 41fu)   BCC (basal cell carcinoma of skin) 10/25/2015   right chest no treatment basaliod neoplasm   DJD (degenerative joint disease)    GERD (gastroesophageal reflux disease)    Hydrocele    Hypertension    Right hydrocele 2021   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   left temple (cx3 86fu)   Seasonal allergies     Family History  Family history unknown: Yes    Past Surgical History:  Procedure Laterality Date   APPENDECTOMY     hemorrhaged post op   BACK SURGERY     CERVICAL DISC SURGERY  04/03/1993   DIAGNOSTIC LAPAROSCOPY     HERNIA REPAIR     umbilical   HYDROCELE EXCISION Right 07/14/2019   Procedure: HYDROCELECTOMY ADULT;  Surgeon: Ihor Gully, MD;  Location: [facility];  Service: Urology;  Laterality: Right;  KNEE ARTHROSCOPY  04/03/2008   lt   LUMBAR LAMINECTOMY  04/03/2006   SEPTOPLASTY     TONSILLECTOMY Bilateral 1970   TOTAL HIP ARTHROPLASTY Right 11/15/2020   Procedure: RIGHT TOTAL HIP ARTHROPLASTY ANTERIOR APPROACH;  Surgeon: Tarry Kos, MD;  Location: [facility];  Service: Orthopedics;  Laterality: Right;  3-C   Social History   Occupational History   Not on file  Tobacco Use   Smoking status: Former    Current packs/day: 0.00    Types: Cigarettes    Start date: 04/03/1972    Quit date: 04/03/1973    Years since quitting: 49.6   Smokeless tobacco: Never  Vaping Use   Vaping status: Never Used  Substance and Sexual Activity   Alcohol use: Not Currently   Drug use: No   Sexual activity: Yes

## 2022-12-05 DIAGNOSIS — M9902 Segmental and somatic dysfunction of thoracic region: Secondary | ICD-10-CM | POA: Diagnosis not present

## 2022-12-05 DIAGNOSIS — M9903 Segmental and somatic dysfunction of lumbar region: Secondary | ICD-10-CM | POA: Diagnosis not present

## 2022-12-05 DIAGNOSIS — M9905 Segmental and somatic dysfunction of pelvic region: Secondary | ICD-10-CM | POA: Diagnosis not present

## 2022-12-05 DIAGNOSIS — M9901 Segmental and somatic dysfunction of cervical region: Secondary | ICD-10-CM | POA: Diagnosis not present

## 2022-12-13 DIAGNOSIS — Z08 Encounter for follow-up examination after completed treatment for malignant neoplasm: Secondary | ICD-10-CM | POA: Diagnosis not present

## 2022-12-13 DIAGNOSIS — L821 Other seborrheic keratosis: Secondary | ICD-10-CM | POA: Diagnosis not present

## 2022-12-13 DIAGNOSIS — L814 Other melanin hyperpigmentation: Secondary | ICD-10-CM | POA: Diagnosis not present

## 2022-12-13 DIAGNOSIS — Z85828 Personal history of other malignant neoplasm of skin: Secondary | ICD-10-CM | POA: Diagnosis not present

## 2022-12-13 DIAGNOSIS — L570 Actinic keratosis: Secondary | ICD-10-CM | POA: Diagnosis not present

## 2022-12-13 DIAGNOSIS — L578 Other skin changes due to chronic exposure to nonionizing radiation: Secondary | ICD-10-CM | POA: Diagnosis not present

## 2022-12-13 DIAGNOSIS — Z7189 Other specified counseling: Secondary | ICD-10-CM | POA: Diagnosis not present

## 2022-12-13 DIAGNOSIS — D225 Melanocytic nevi of trunk: Secondary | ICD-10-CM | POA: Diagnosis not present

## 2022-12-13 DIAGNOSIS — L218 Other seborrheic dermatitis: Secondary | ICD-10-CM | POA: Diagnosis not present

## 2023-01-08 DIAGNOSIS — M9903 Segmental and somatic dysfunction of lumbar region: Secondary | ICD-10-CM | POA: Diagnosis not present

## 2023-01-08 DIAGNOSIS — M9901 Segmental and somatic dysfunction of cervical region: Secondary | ICD-10-CM | POA: Diagnosis not present

## 2023-01-08 DIAGNOSIS — M9905 Segmental and somatic dysfunction of pelvic region: Secondary | ICD-10-CM | POA: Diagnosis not present

## 2023-01-08 DIAGNOSIS — M9902 Segmental and somatic dysfunction of thoracic region: Secondary | ICD-10-CM | POA: Diagnosis not present

## 2023-01-09 DIAGNOSIS — Z8719 Personal history of other diseases of the digestive system: Secondary | ICD-10-CM | POA: Diagnosis not present

## 2023-01-09 DIAGNOSIS — Z800 Family history of malignant neoplasm of digestive organs: Secondary | ICD-10-CM | POA: Diagnosis not present

## 2023-01-09 DIAGNOSIS — Z1211 Encounter for screening for malignant neoplasm of colon: Secondary | ICD-10-CM | POA: Diagnosis not present

## 2023-02-05 DIAGNOSIS — M9902 Segmental and somatic dysfunction of thoracic region: Secondary | ICD-10-CM | POA: Diagnosis not present

## 2023-02-05 DIAGNOSIS — H814 Vertigo of central origin: Secondary | ICD-10-CM | POA: Diagnosis not present

## 2023-02-05 DIAGNOSIS — G4486 Cervicogenic headache: Secondary | ICD-10-CM | POA: Diagnosis not present

## 2023-02-05 DIAGNOSIS — M9901 Segmental and somatic dysfunction of cervical region: Secondary | ICD-10-CM | POA: Diagnosis not present

## 2023-02-08 ENCOUNTER — Other Ambulatory Visit: Payer: Self-pay

## 2023-02-08 ENCOUNTER — Ambulatory Visit
Admission: EM | Admit: 2023-02-08 | Discharge: 2023-02-08 | Disposition: A | Discharge status: Home / Self Care | Payer: Medicare PPO | Attending: Internal Medicine | Admitting: Internal Medicine

## 2023-02-08 DIAGNOSIS — J029 Acute pharyngitis, unspecified: Secondary | ICD-10-CM | POA: Diagnosis not present

## 2023-02-08 DIAGNOSIS — H6593 Unspecified nonsuppurative otitis media, bilateral: Secondary | ICD-10-CM | POA: Insufficient documentation

## 2023-02-08 LAB — POCT RAPID STREP A (OFFICE): Rapid Strep A Screen: NEGATIVE

## 2023-02-08 MED ORDER — PREDNISONE 20 MG PO TABS: 40.0000 mg | ORAL_TABLET | Freq: Every day | ORAL | 0 refills | Status: AC

## 2023-02-08 NOTE — ED Provider Notes (Signed)
[name]    CSN: [id number] Arrival date & time: 02/08/23  1447      History   Chief Complaint Chief Complaint  Patient presents with   Otalgia    HPI Adrian [name] is a 72 y.o. male.   Patient presents with bilateral ear pain and sore throat that started about 6 days ago.  Reports minimal nasal congestion but denies coughing, fever, chest pain, shortness of breath.  Reports that he has been using Flonase and daily allergy medicine with no improvement.  Denies any known sick contacts.   Otalgia   Past Medical History:  Diagnosis Date   Arthritis    Atypical nevus 04/20/2004   left low back (wider shave)   Basal cell carcinoma 01/16/1995   left back (cx3 61fu exc)   BCC (basal cell carcinoma of skin) 10/13/2002   left supra brow (cx3 fu exc)   BCC (basal cell carcinoma of skin) 04/20/2004   center upper back (cx3 70fu)   BCC (basal cell carcinoma of skin) 04/24/2006   upper mid back (cx3 26fu)   BCC (basal cell carcinoma of skin) 10/28/2012   left upper back (cx3 64fu)   BCC (basal cell carcinoma of skin) 10/28/2012   right back upper (cx3 37fu)   BCC (basal cell carcinoma of skin) 10/28/2012   right deltoid (cx3 55fu)   BCC (basal cell carcinoma of skin) 10/25/2015   right chest no treatment basaliod neoplasm   DJD (degenerative joint disease)    GERD (gastroesophageal reflux disease)    Hydrocele    Hypertension    Right hydrocele 2021   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   left temple (cx3 38fu)   Seasonal allergies     Patient Active Problem List   Diagnosis Date Noted   Primary osteoarthritis of right hip 11/15/2020   Status post total replacement of right hip 11/15/2020   Vertigo    Coronary artery disease due to calcified coronary lesion 07/02/2018   Essential hypertension 07/02/2018   Pure hypercholesterolemia 07/02/2018    Past Surgical History:  Procedure Laterality Date   APPENDECTOMY     hemorrhaged post op   BACK  SURGERY     CERVICAL DISC SURGERY  04/03/1993   DIAGNOSTIC LAPAROSCOPY     HERNIA REPAIR     umbilical   HYDROCELE EXCISION Right 07/14/2019   Procedure: HYDROCELECTOMY ADULT;  Surgeon: Ihor Gully, MD;  Location: [facility];  Service: Urology;  Laterality: Right;   KNEE ARTHROSCOPY  04/03/2008   lt   LUMBAR LAMINECTOMY  04/03/2006   SEPTOPLASTY     TONSILLECTOMY Bilateral 1970   TOTAL HIP ARTHROPLASTY Right 11/15/2020   Procedure: RIGHT TOTAL HIP ARTHROPLASTY ANTERIOR APPROACH;  Surgeon: Tarry Kos, MD;  Location: [facility];  Service: Orthopedics;  Laterality: Right;  3-C       Home Medications    Prior to Admission medications   Medication Sig Start Date End Date Taking? Authorizing Provider  predniSONE (DELTASONE) 20 MG tablet Take 2 tablets (40 mg total) by mouth daily for 5 days. 02/08/23 02/13/23 Yes Marieta Markov, Acie Fredrickson, FNP  acetaminophen (TYLENOL) 500 MG tablet Take 500 mg by mouth every 6 (six) hours as needed for moderate pain.    [provider]  amoxicillin (AMOXIL) 500 MG capsule Take 4 pills one hour prior to dental work. 04/29/21   Cristie Hem, PA-C  amoxicillin-clavulanate (AUGMENTIN) 875-125 MG tablet Take 1 tablet by mouth every  12 (twelve) hours. 11/04/21   Tomi Bamberger, PA-C  aspirin EC 81 MG tablet Take 1 tablet (81 mg total) by mouth 2 (two) times daily. To be taken after surgery 11/08/20   Cristie Hem, PA-C  Cholecalciferol (VITAMIN D) 50 MCG (2000 UT) CAPS Take 2,000 Units by mouth daily.    [provider]  Coenzyme Q10 (COQ10 PO) Take 300 mg by mouth daily.    [provider]  fluticasone (FLONASE) 50 MCG/ACT nasal spray Place 2 sprays into both nostrils daily.    [provider]  ibuprofen (ADVIL) 200 MG tablet Take 200 mg by mouth every 6 (six) hours as needed for moderate pain.    [provider]  methocarbamol (ROBAXIN) 500 MG tablet Take 500 mg by mouth as needed. 04/13/21   [provider]  olmesartan (BENICAR) 20 MG tablet Take 20 mg by mouth daily.    [provider]  rosuvastatin (CRESTOR) 40 MG tablet Take 1 tablet (40 mg total) by mouth daily. 10/03/21   Lewayne Bunting, MD    Family History Family History  Family history unknown: Yes    Social History Social History   Tobacco Use   Smoking status: Former    Current packs/day: 0.00    Types: Cigarettes    Start date: 04/03/1972    Quit date: 04/03/1973    Years since quitting: 49.8   Smokeless tobacco: Never  Vaping Use   Vaping status: Never Used  Substance Use Topics   Alcohol use: Not Currently   Drug use: No     Allergies   Patient has no known allergies.   Review of Systems Review of Systems Per HPI  Physical Exam Triage Vital Signs ED Triage Vitals  Encounter Vitals Group     BP 02/08/23 1606 117/73     Systolic BP Percentile --      Diastolic BP Percentile --      Pulse Rate 02/08/23 1606 87     Resp 02/08/23 1606 18     Temp 02/08/23 1606 98.8 F (37.1 C)     Temp Source 02/08/23 1606 Oral     SpO2 02/08/23 1606 100 %     Weight 02/08/23 1605 195 lb (88.5 kg)     Height 02/08/23 1605 5\' 10"  (1.778 m)     Head Circumference --      Peak Flow --      Pain Score 02/08/23 1605 5     Pain Loc --      Pain Education --      Exclude from Growth Chart --    No data found.  Updated Vital Signs BP 117/73 (BP Location: Left Arm)   Pulse 87   Temp 98.8 F (37.1 C) (Oral)   Resp 18   Ht 5\' 10"  (1.778 m)   Wt 195 lb (88.5 kg)   SpO2 100%   BMI 27.98 kg/m   Visual Acuity Right Eye Distance:   Left Eye Distance:   Bilateral Distance:    Right Eye Near:   Left Eye Near:    Bilateral Near:     Physical Exam Constitutional:      General: He is not in acute distress.    Appearance: Normal appearance. He is not toxic-appearing or diaphoretic.  HENT:     Head: Normocephalic and atraumatic.     Right Ear: Ear canal normal. No drainage, swelling or  tenderness. A middle ear effusion is present. Tympanic  membrane is not perforated, erythematous or bulging.     Left Ear: Ear canal normal. No drainage or swelling. A middle ear effusion is present. Tympanic membrane is not perforated, erythematous or bulging.     Nose: Congestion present.     Mouth/Throat:     Mouth: Mucous membranes are moist.     Pharynx: Posterior oropharyngeal erythema present.  Eyes:     Extraocular Movements: Extraocular movements intact.     Conjunctiva/sclera: Conjunctivae normal.     Pupils: Pupils are equal, round, and reactive to light.  Cardiovascular:     Rate and Rhythm: Normal rate and regular rhythm.     Pulses: Normal pulses.     Heart sounds: Normal heart sounds.  Pulmonary:     Effort: Pulmonary effort is normal. No respiratory distress.     Breath sounds: Normal breath sounds. No stridor. No wheezing, rhonchi or rales.  Abdominal:     General: Abdomen is flat. Bowel sounds are normal.     Palpations: Abdomen is soft.  Musculoskeletal:        General: Normal range of motion.     Cervical back: Normal range of motion.  Skin:    General: Skin is warm and dry.  Neurological:     General: No focal deficit present.     Mental Status: He is alert and oriented to person, place, and time. Mental status is at baseline.  Psychiatric:        Mood and Affect: Mood normal.        Behavior: Behavior normal.      UC Treatments / Results  Labs (all labs ordered are listed, but only abnormal results are displayed) Labs Reviewed  CULTURE, GROUP A STREP [facility])  POCT RAPID STREP A (OFFICE)    EKG   Radiology No results found.  Procedures Procedures (including critical care time)  Medications Ordered in UC Medications - No data to display  Initial Impression / Assessment and Plan / UC Course  I have reviewed the triage vital signs and the nursing notes.  Pertinent labs & imaging results that were available during my care of the patient were  reviewed by me and considered in my medical decision making (see chart for details).     Strep is negative. throat culture pending.  Patient declined COVID testing.  Differential diagnoses include allergic rhinitis versus viral illness.  Patient has fluid behind TMs and Flonase and Zyrtec have not been helpful so will treat with prednisone steroid burst.  Patient states he has taken this previously and tolerated well.  No obvious contraindications to steroid therapy noted in patient's history.  Advised strict follow-up precautions if any symptoms persist or worsen.  Patient verbalized understanding and was agreeable with plan. Final Clinical Impressions(s) / UC Diagnoses   Final diagnoses:  Fluid level behind tympanic membrane of both ears  Acute pharyngitis, unspecified etiology     Discharge Instructions      Strep is negative.  Throat culture pending.  I will call if it is positive.  I have prescribed you prednisone to help alleviate symptoms.  Please follow-up if any symptoms persist or worsen.     ED Prescriptions     Medication Sig Dispense Auth. Provider   predniSONE (DELTASONE) 20 MG tablet Take 2 tablets (40 mg total) by mouth daily for 5 days. 10 tablet Gustavus Bryant, [place]      PDMP not reviewed this encounter.   Gustavus Bryant, [place] 02/08/23 1714

## 2023-02-08 NOTE — ED Triage Notes (Signed)
Patient presents with bilateral ear pain and sore throat x day 6. Patient states he has been using Flonase and gargling with mouth wash.

## 2023-02-08 NOTE — Discharge Instructions (Signed)
Strep is negative.  Throat culture pending.  I will call if it is positive.  I have prescribed you prednisone to help alleviate symptoms.  Please follow-up if any symptoms persist or worsen.

## 2023-02-12 LAB — CULTURE, GROUP A STREP (THRC)

## 2023-02-28 DIAGNOSIS — R972 Elevated prostate specific antigen [PSA]: Secondary | ICD-10-CM | POA: Diagnosis not present

## 2023-02-28 DIAGNOSIS — R946 Abnormal results of thyroid function studies: Secondary | ICD-10-CM | POA: Diagnosis not present

## 2023-02-28 DIAGNOSIS — R7301 Impaired fasting glucose: Secondary | ICD-10-CM | POA: Diagnosis not present

## 2023-02-28 DIAGNOSIS — I2510 Atherosclerotic heart disease of native coronary artery without angina pectoris: Secondary | ICD-10-CM | POA: Diagnosis not present

## 2023-02-28 DIAGNOSIS — I10 Essential (primary) hypertension: Secondary | ICD-10-CM | POA: Diagnosis not present

## 2023-02-28 DIAGNOSIS — E785 Hyperlipidemia, unspecified: Secondary | ICD-10-CM | POA: Diagnosis not present

## 2023-03-05 DIAGNOSIS — M9903 Segmental and somatic dysfunction of lumbar region: Secondary | ICD-10-CM | POA: Diagnosis not present

## 2023-03-05 DIAGNOSIS — M9902 Segmental and somatic dysfunction of thoracic region: Secondary | ICD-10-CM | POA: Diagnosis not present

## 2023-03-05 DIAGNOSIS — M9905 Segmental and somatic dysfunction of pelvic region: Secondary | ICD-10-CM | POA: Diagnosis not present

## 2023-03-05 DIAGNOSIS — M9901 Segmental and somatic dysfunction of cervical region: Secondary | ICD-10-CM | POA: Diagnosis not present

## 2023-03-07 DIAGNOSIS — Z0000 Encounter for general adult medical examination without abnormal findings: Secondary | ICD-10-CM | POA: Diagnosis not present

## 2023-03-07 DIAGNOSIS — R82998 Other abnormal findings in urine: Secondary | ICD-10-CM | POA: Diagnosis not present

## 2023-03-07 DIAGNOSIS — Z23 Encounter for immunization: Secondary | ICD-10-CM | POA: Diagnosis not present

## 2023-03-07 DIAGNOSIS — R972 Elevated prostate specific antigen [PSA]: Secondary | ICD-10-CM | POA: Diagnosis not present

## 2023-03-07 DIAGNOSIS — Z1331 Encounter for screening for depression: Secondary | ICD-10-CM | POA: Diagnosis not present

## 2023-03-07 DIAGNOSIS — I10 Essential (primary) hypertension: Secondary | ICD-10-CM | POA: Diagnosis not present

## 2023-03-07 DIAGNOSIS — E785 Hyperlipidemia, unspecified: Secondary | ICD-10-CM | POA: Diagnosis not present

## 2023-03-07 DIAGNOSIS — Z1339 Encounter for screening examination for other mental health and behavioral disorders: Secondary | ICD-10-CM | POA: Diagnosis not present

## 2023-03-07 DIAGNOSIS — J309 Allergic rhinitis, unspecified: Secondary | ICD-10-CM | POA: Diagnosis not present

## 2023-03-07 DIAGNOSIS — R7301 Impaired fasting glucose: Secondary | ICD-10-CM | POA: Diagnosis not present

## 2023-03-07 DIAGNOSIS — I2510 Atherosclerotic heart disease of native coronary artery without angina pectoris: Secondary | ICD-10-CM | POA: Diagnosis not present

## 2023-03-07 DIAGNOSIS — H8110 Benign paroxysmal vertigo, unspecified ear: Secondary | ICD-10-CM | POA: Diagnosis not present

## 2023-03-07 DIAGNOSIS — R946 Abnormal results of thyroid function studies: Secondary | ICD-10-CM | POA: Diagnosis not present

## 2023-04-09 DIAGNOSIS — M9901 Segmental and somatic dysfunction of cervical region: Secondary | ICD-10-CM | POA: Diagnosis not present

## 2023-04-09 DIAGNOSIS — M9903 Segmental and somatic dysfunction of lumbar region: Secondary | ICD-10-CM | POA: Diagnosis not present

## 2023-04-09 DIAGNOSIS — M9905 Segmental and somatic dysfunction of pelvic region: Secondary | ICD-10-CM | POA: Diagnosis not present

## 2023-04-09 DIAGNOSIS — M9902 Segmental and somatic dysfunction of thoracic region: Secondary | ICD-10-CM | POA: Diagnosis not present

## 2023-04-10 ENCOUNTER — Other Ambulatory Visit (INDEPENDENT_AMBULATORY_CARE_PROVIDER_SITE_OTHER): Payer: Medicare PPO

## 2023-04-10 ENCOUNTER — Ambulatory Visit: Payer: Medicare PPO | Admitting: Physician Assistant

## 2023-04-10 DIAGNOSIS — M1712 Unilateral primary osteoarthritis, left knee: Secondary | ICD-10-CM

## 2023-04-10 NOTE — Progress Notes (Signed)
 Office Visit Note   Patient: Adrian [name]           Date of Birth: 08/07/1950           MRN: [id number] Visit Date: 04/10/2023              Requested by: Loreli Elsie [name], MD [address] [place],  [place] [address] PCP: Loreli Elsie [name], MD   Assessment & Plan: Advance Visit Diagnoses:  1. Unilateral primary osteoarthritis, left knee     Plan: Impression is advanced left knee osteoarthritis.  We have discussed various treatment options to include activity modification versus topical and oral NSAIDs versus cortisone injection versus knee replacement surgery.  He would like to try activity modification for now.  He has topical Voltaren at home for which she will try next needed.  He will follow-up as needed.  Follow-Up Instructions: Return if symptoms worsen or fail to improve.   Orders:  Orders Placed This Encounter  Procedures   XR KNEE 3 VIEW LEFT   No orders of the defined types were placed in this encounter.     Procedures: No procedures performed   Clinical Data: No additional findings.   Subjective: Chief Complaint  Patient presents with   Left Knee - Pain    HPI patient is a very pleasant 73 year old gentleman who comes in today with left knee pain.  History of underlying left knee osteoarthritis.  He was seen in our office a couple years ago for this.  Activity modification and topical NSAIDs were recommended.  He has done well until recently.  He started walking with his wife about a week or so ago when his symptom review turned.  The pain is described as a sharp pain to the lateral knee with a constant ache to the anterior medial joint line and patella.  Symptoms occur when he is walking as well as after activity.  He has not taken any medication for this.  Review of Systems as detailed in HPI.  All other review and are negative.   Objective: Vital Signs: There were no vitals taken for this visit.  Physical Exam well-developed well-nourished  gentleman in no acute distress.  Alert and oriented x 3.  Ortho Exam left knee exam shows no effusion.  Range of motion 0 to 120 degrees.  Medial and lateral joint line tenderness.  He is neurovascular intact distally.  Specialty Comments:  No specialty comments available.  Imaging: XR KNEE 3 VIEW LEFT Result Date: 04/10/2023 Degenerative changes.  Moderate medial compartment degenerative changes with para-articular osteophyte formation.    PMFS History: Patient Active Problem List   Diagnosis Date Noted   Primary osteoarthritis of right hip 11/15/2020   Status post total replacement of right hip 11/15/2020   Vertigo    Coronary artery disease due to calcified coronary lesion 07/02/2018   Essential hypertension 07/02/2018   Pure hypercholesterolemia 07/02/2018   Past Medical History:  Diagnosis Date   Arthritis    Atypical nevus 04/20/2004   left low back (wider shave)   Basal cell carcinoma 01/16/1995   left back (cx3 33fu exc)   BCC (basal cell carcinoma of skin) 10/13/2002   left supra brow (cx3 fu exc)   BCC (basal cell carcinoma of skin) 04/20/2004   center upper back (cx3 71fu)   BCC (basal cell carcinoma of skin) 04/24/2006   upper mid back (cx3 5fu)   BCC (basal cell carcinoma of skin) 10/28/2012   left  upper back (cx3 89fu)   BCC (basal cell carcinoma of skin) 10/28/2012   right back upper (cx3 43fu)   BCC (basal cell carcinoma of skin) 10/28/2012   right deltoid (cx3 55fu)   BCC (basal cell carcinoma of skin) 10/25/2015   right chest no treatment basaliod neoplasm   DJD (degenerative joint disease)    GERD (gastroesophageal reflux disease)    Hydrocele    Hypertension    Right hydrocele 2021   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   left temple (cx3 52fu)   Seasonal allergies     Family History  Family history unknown: Yes    Past Surgical History:  Procedure Laterality Date   APPENDECTOMY     hemorrhaged post op   BACK SURGERY     CERVICAL  DISC SURGERY  04/03/1993   DIAGNOSTIC LAPAROSCOPY     HERNIA REPAIR     umbilical   HYDROCELE EXCISION Right 07/14/2019   Procedure: HYDROCELECTOMY ADULT;  Surgeon: Ottelin, Mark, MD;  Location: [facility];  Service: Urology;  Laterality: Right;   KNEE ARTHROSCOPY  04/03/2008   lt   LUMBAR LAMINECTOMY  04/03/2006   SEPTOPLASTY     TONSILLECTOMY Bilateral 1970   TOTAL HIP ARTHROPLASTY Right 11/15/2020   Procedure: RIGHT TOTAL HIP ARTHROPLASTY ANTERIOR APPROACH;  Surgeon: Jerri Kay HERO, MD;  Location: [facility];  Service: Orthopedics;  Laterality: Right;  3-C   Social History   Occupational History   Not on file  Tobacco Use   Smoking status: Former    Current packs/day: 0.00    Types: Cigarettes    Start date: 04/03/1972    Quit date: 04/03/1973    Years since quitting: 50.0   Smokeless tobacco: Never  Vaping Use   Vaping status: Never Used  Substance and Sexual Activity   Alcohol  use: Not Currently   Drug use: No   Sexual activity: Yes

## 2023-04-19 DIAGNOSIS — H40013 Open angle with borderline findings, low risk, bilateral: Secondary | ICD-10-CM | POA: Diagnosis not present

## 2023-04-19 DIAGNOSIS — H2589 Other age-related cataract: Secondary | ICD-10-CM | POA: Diagnosis not present

## 2023-04-19 DIAGNOSIS — H25813 Combined forms of age-related cataract, bilateral: Secondary | ICD-10-CM | POA: Diagnosis not present

## 2023-04-19 DIAGNOSIS — H524 Presbyopia: Secondary | ICD-10-CM | POA: Diagnosis not present

## 2023-04-19 DIAGNOSIS — H40033 Anatomical narrow angle, bilateral: Secondary | ICD-10-CM | POA: Diagnosis not present

## 2023-05-02 DIAGNOSIS — N401 Enlarged prostate with lower urinary tract symptoms: Secondary | ICD-10-CM | POA: Diagnosis not present

## 2023-05-02 DIAGNOSIS — R972 Elevated prostate specific antigen [PSA]: Secondary | ICD-10-CM | POA: Diagnosis not present

## 2023-05-02 DIAGNOSIS — R351 Nocturia: Secondary | ICD-10-CM | POA: Diagnosis not present

## 2023-06-18 DIAGNOSIS — B353 Tinea pedis: Secondary | ICD-10-CM | POA: Diagnosis not present

## 2023-06-18 DIAGNOSIS — D225 Melanocytic nevi of trunk: Secondary | ICD-10-CM | POA: Diagnosis not present

## 2023-06-18 DIAGNOSIS — Z7189 Other specified counseling: Secondary | ICD-10-CM | POA: Diagnosis not present

## 2023-06-18 DIAGNOSIS — L578 Other skin changes due to chronic exposure to nonionizing radiation: Secondary | ICD-10-CM | POA: Diagnosis not present

## 2023-06-18 DIAGNOSIS — L821 Other seborrheic keratosis: Secondary | ICD-10-CM | POA: Diagnosis not present

## 2023-06-18 DIAGNOSIS — Z08 Encounter for follow-up examination after completed treatment for malignant neoplasm: Secondary | ICD-10-CM | POA: Diagnosis not present

## 2023-06-18 DIAGNOSIS — L814 Other melanin hyperpigmentation: Secondary | ICD-10-CM | POA: Diagnosis not present

## 2023-06-18 DIAGNOSIS — Z85828 Personal history of other malignant neoplasm of skin: Secondary | ICD-10-CM | POA: Diagnosis not present

## 2023-07-05 DIAGNOSIS — M9902 Segmental and somatic dysfunction of thoracic region: Secondary | ICD-10-CM | POA: Diagnosis not present

## 2023-07-05 DIAGNOSIS — M9905 Segmental and somatic dysfunction of pelvic region: Secondary | ICD-10-CM | POA: Diagnosis not present

## 2023-07-05 DIAGNOSIS — M9901 Segmental and somatic dysfunction of cervical region: Secondary | ICD-10-CM | POA: Diagnosis not present

## 2023-07-05 DIAGNOSIS — M9903 Segmental and somatic dysfunction of lumbar region: Secondary | ICD-10-CM | POA: Diagnosis not present

## 2023-07-23 DIAGNOSIS — R972 Elevated prostate specific antigen [PSA]: Secondary | ICD-10-CM | POA: Diagnosis not present

## 2023-07-30 DIAGNOSIS — R351 Nocturia: Secondary | ICD-10-CM | POA: Diagnosis not present

## 2023-07-30 DIAGNOSIS — R972 Elevated prostate specific antigen [PSA]: Secondary | ICD-10-CM | POA: Diagnosis not present

## 2023-07-30 DIAGNOSIS — N401 Enlarged prostate with lower urinary tract symptoms: Secondary | ICD-10-CM | POA: Diagnosis not present

## 2023-08-15 DIAGNOSIS — D120 Benign neoplasm of cecum: Secondary | ICD-10-CM | POA: Diagnosis not present

## 2023-08-15 DIAGNOSIS — Z79899 Other long term (current) drug therapy: Secondary | ICD-10-CM | POA: Diagnosis not present

## 2023-08-15 DIAGNOSIS — Z7982 Long term (current) use of aspirin: Secondary | ICD-10-CM | POA: Diagnosis not present

## 2023-08-15 DIAGNOSIS — Z8719 Personal history of other diseases of the digestive system: Secondary | ICD-10-CM | POA: Diagnosis not present

## 2023-08-15 DIAGNOSIS — Z87891 Personal history of nicotine dependence: Secondary | ICD-10-CM | POA: Diagnosis not present

## 2023-08-15 DIAGNOSIS — K635 Polyp of colon: Secondary | ICD-10-CM | POA: Diagnosis not present

## 2023-08-15 DIAGNOSIS — K5730 Diverticulosis of large intestine without perforation or abscess without bleeding: Secondary | ICD-10-CM | POA: Diagnosis not present

## 2023-08-15 DIAGNOSIS — Z1211 Encounter for screening for malignant neoplasm of colon: Secondary | ICD-10-CM | POA: Diagnosis not present

## 2023-08-15 DIAGNOSIS — D122 Benign neoplasm of ascending colon: Secondary | ICD-10-CM | POA: Diagnosis not present

## 2023-08-15 DIAGNOSIS — Z800 Family history of malignant neoplasm of digestive organs: Secondary | ICD-10-CM | POA: Diagnosis not present

## 2023-10-26 ENCOUNTER — Telehealth: Payer: Self-pay | Admitting: Cardiology

## 2023-10-26 NOTE — Telephone Encounter (Signed)
 Pt c/o of Chest Pain: STAT if active (IN THIS MOMENT) CP, including tightness, pressure, jaw pain, shoulder/upper arm/back pain, SOB, nausea, and vomiting.  1. Are you having CP right now (tightness, pressure, or discomfort)? No 2. Are you experiencing any other symptoms (ex. SOB, nausea, vomiting, sweating)? Lightheaded yesterday, but stated he was taking his BP medication and tumeric and thinks that could be the cause of the lightheadedness.  3. How long have you been experiencing CP? Been on going  4. Is your CP continuous or coming and going? Coming and going  5. Have you taken Nitroglycerin? No 6. If CP returns before callback, please consider calling 911. ?    Patient is scheduled for an overdue follow up on 11/19/23.

## 2023-10-26 NOTE — Telephone Encounter (Signed)
 Call to patient to discuss symptoms.   Patient reports he has had right sided chest pain associated with radiating spinal pain (and history of several spine surgeries) for several years. This is documented in his office visit note from 10/03/21. He says that usually he can alleviate this pain with physical therapy exercises but for the past 2 days he has been feeling not great.  He says that he has been more active than usual and due to travel has not been able to do his physical therapy exercises. He says he also started taking a large dose of supplemental turmeric and has noticed that he's felt lightheaded and just bad the last 2 days, though he reports he felt better after rest. He states he has a history of vertigo so he doesn't know if the lightheaded feeling is related to his vertigo, which is usually relieved with eppley maneuvers.   He endorses only right sided chest pain, alleviated with position changes. He denies SOB, leg swelling. BP today is 140/77 and HR was 77. He states he will stop taking turmeric until upcoming appointment with APP 11/19/23. He states he is ok to wait for this appointment. He also states he has appt with physical therapist 11/02/23 to work on alleviating spinal pain in low back. Advised patient to go to ED or call 911 if chest pain worsens or is accompanied by SOB. Patient verbalizes understanding.

## 2023-11-02 DIAGNOSIS — R269 Unspecified abnormalities of gait and mobility: Secondary | ICD-10-CM | POA: Diagnosis not present

## 2023-11-02 DIAGNOSIS — M545 Low back pain, unspecified: Secondary | ICD-10-CM | POA: Diagnosis not present

## 2023-11-17 NOTE — Progress Notes (Unsigned)
 Cardiology Office Note   Date:  11/19/2023  ID:  Adrian [name], DOB 04-12-1950, MRN [id number] PCP: Adrian [name]., MD  [facility] Providers Cardiologist:  None   History of Present Illness Adrian [name] is a 73 y.o. male with a past medical history of coronary artery disease here for follow-up appointment.  History includes calcium  score March in 2020 of 2190 which was 95th percentile.  Exercise treadmill October 2020 showed excellent functional capacity with no ST changes.  Since then, he has struggled with some dyspnea with more vigorous activities but not routine activities.  No orthopnea, PND, pedal edema or syncope.  Does not have any exertional chest pain but he does exercise routinely by walking 2 miles without any symptoms.  Occasionally feels pain in his back radiating to his chest if he sits at his computer wrong.  Has had intermittent pain for years.  No changes in this.  Today, he presents with a high calcium  score with chest pain.  Chest pain occurs during stress or physical exertion, located across the bottom of the rib cage and sometimes at the center of the sternum. The pain is associated with back muscle tension and is not continuous. There is no shortness of breath or sweating, but occasional lightheadedness occurs.  He takes Crestor  20 mg for cholesterol management, as a higher dose was intolerable. Recent lab work shows an LDL of 73, triglycerides of 45, and an A1c of 6.1. He maintains an active lifestyle with walking and swimming.  He recently experienced relief from stressful responsibilities, which he found burdensome.  Reports no shortness of breath nor dyspnea on exertion. No edema, orthopnea, PND. Reports no palpitations.   Discussed the use of AI scribe software for clinical note transcription with the patient, who gave verbal consent to proceed.   ROS: Pertinent ROS in HPI  Studies Reviewed     CT calcium  score, 2020 TECHNIQUE: CT heart was  performed using prospective ECG gating.   A non-contrast exam for calcium  scoring was performed.   Note that this exam targets the heart and the chest was not imaged in its entirety.   COMPARISON:  None.   FINDINGS: Technical quality: Image quality degraded by heart rate of 92 beats per minute, higher than optimal for the study.   CORONARY CALCIUM    Total Agatston Score: 2190 with calcifications most notable in the left anterior descending coronary artery and right coronary artery, also noted in the left circumflex coronary artery.   MESA database percentile:  95   OTHER FINDINGS:   Cardiovascular: Heart is normal size.  Aorta is normal caliber.   Mediastinum/Nodes: No adenopathy in the lower mediastinum or hila.   Lungs/Pleura: Visualized lungs clear.   Upper Abdomen: Imaging into the upper abdomen shows no acute findings.   Musculoskeletal: Chest wall soft tissues are unremarkable. No acute bony abnormality.   IMPRESSION: The observed calcium  score of 2190 is at the percentile 95 for subjects of the same age, gender and race/ethnicity who are free of clinical cardiovascular disease and treated diabetes.   No acute or significant extracardiac abnormality     Physical Exam VS:  BP 132/66 (BP Location: Left Arm, Patient Position: Sitting, Cuff Size: Large)   Pulse 86   Ht 5' 10 (1.778 m)   Wt 200 lb (90.7 kg)   SpO2 95%   BMI 28.70 kg/m        Wt Readings from Last 3 Encounters:  11/19/23 200 lb (  90.7 kg)  02/08/23 195 lb (88.5 kg)  10/03/21 199 lb 9.6 oz (90.5 kg)    GEN: Well nourished, well developed in no acute distress NECK: No JVD; No carotid bruits CARDIAC: RRR, no murmurs, rubs, gallops RESPIRATORY:  Clear to auscultation without rales, wheezing or rhonchi  ABDOMEN: Soft, non-tender, non-distended EXTREMITIES:  No edema; No deformity   ASSESSMENT AND PLAN  Coronary artery disease with elevated coronary calcium  score Intermittent chest pain.  High calcium  score in 95th percentile. Discussed CT coronary angiogram benefits for detailed coronary artery imaging with less risk than cardiac catheterization. - Order CT coronary angiogram with calcium  score. - Review results post-CT scan for further management.  Hyperlipidemia LDL at 73 mg/dL, close to target. Triglycerides and HDL satisfactory. Current Crestor  20 mg regimen effective without symptoms. - Continue Crestor  20 mg daily. - Schedule follow-up lab work in January with Dr. Loreli.  Hypertension -well controlled today - Continue Benicar 20 mg daily  Bruit -Not identified on exam today        Dispo: He can follow-up in 6 weeks to review testing  Signed, Adrian LOISE Fabry, PA-C

## 2023-11-17 NOTE — H&P (View-Only) (Signed)
 Cardiology Office Note   Date:  11/19/2023  ID:  Adrian [name], DOB 03/20/1951, MRN [id number] PCP: Adrian [name]., MD  [facility] Providers Cardiologist:  None   History of Present Illness Adrian [name] is a 73 y.o. male with a past medical history of coronary artery disease here for follow-up appointment.  History includes calcium  score March in 2020 of 2190 which was 95th percentile.  Exercise treadmill October 2020 showed excellent functional capacity with no ST changes.  Since then, he has struggled with some dyspnea with more vigorous activities but not routine activities.  No orthopnea, PND, pedal edema or syncope.  Does not have any exertional chest pain but he does exercise routinely by walking 2 miles without any symptoms.  Occasionally feels pain in his back radiating to his chest if he sits at his computer wrong.  Has had intermittent pain for years.  No changes in this.  Today, he presents with a high calcium  score with chest pain.  Chest pain occurs during stress or physical exertion, located across the bottom of the rib cage and sometimes at the center of the sternum. The pain is associated with back muscle tension and is not continuous. There is no shortness of breath or sweating, but occasional lightheadedness occurs.  He takes Crestor  20 mg for cholesterol management, as a higher dose was intolerable. Recent lab work shows an LDL of 73, triglycerides of 45, and an A1c of 6.1. He maintains an active lifestyle with walking and swimming.  He recently experienced relief from stressful responsibilities, which he found burdensome.  Reports no shortness of breath nor dyspnea on exertion. No edema, orthopnea, PND. Reports no palpitations.   Discussed the use of AI scribe software for clinical note transcription with the patient, who gave verbal consent to proceed.   ROS: Pertinent ROS in HPI  Studies Reviewed     CT calcium  score, 2020 TECHNIQUE: CT heart was  performed using prospective ECG gating.   A non-contrast exam for calcium  scoring was performed.   Note that this exam targets the heart and the chest was not imaged in its entirety.   COMPARISON:  None.   FINDINGS: Technical quality: Image quality degraded by heart rate of 92 beats per minute, higher than optimal for the study.   CORONARY CALCIUM    Total Agatston Score: 2190 with calcifications most notable in the left anterior descending coronary artery and right coronary artery, also noted in the left circumflex coronary artery.   MESA database percentile:  95   OTHER FINDINGS:   Cardiovascular: Heart is normal size.  Aorta is normal caliber.   Mediastinum/Nodes: No adenopathy in the lower mediastinum or hila.   Lungs/Pleura: Visualized lungs clear.   Upper Abdomen: Imaging into the upper abdomen shows no acute findings.   Musculoskeletal: Chest wall soft tissues are unremarkable. No acute bony abnormality.   IMPRESSION: The observed calcium  score of 2190 is at the percentile 95 for subjects of the same age, gender and race/ethnicity who are free of clinical cardiovascular disease and treated diabetes.   No acute or significant extracardiac abnormality     Physical Exam VS:  BP 132/66 (BP Location: Left Arm, Patient Position: Sitting, Cuff Size: Large)   Pulse 86   Ht 5' 10 (1.778 m)   Wt 200 lb (90.7 kg)   SpO2 95%   BMI 28.70 kg/m        Wt Readings from Last 3 Encounters:  11/19/23 200 lb (  90.7 kg)  02/08/23 195 lb (88.5 kg)  10/03/21 199 lb 9.6 oz (90.5 kg)    GEN: Well nourished, well developed in no acute distress NECK: No JVD; No carotid bruits CARDIAC: RRR, no murmurs, rubs, gallops RESPIRATORY:  Clear to auscultation without rales, wheezing or rhonchi  ABDOMEN: Soft, non-tender, non-distended EXTREMITIES:  No edema; No deformity   ASSESSMENT AND PLAN  Coronary artery disease with elevated coronary calcium  score Intermittent chest pain.  High calcium  score in 95th percentile. Discussed CT coronary angiogram benefits for detailed coronary artery imaging with less risk than cardiac catheterization. - Order CT coronary angiogram with calcium  score. - Review results post-CT scan for further management.  Hyperlipidemia LDL at 73 mg/dL, close to target. Triglycerides and HDL satisfactory. Current Crestor  20 mg regimen effective without symptoms. - Continue Crestor  20 mg daily. - Schedule follow-up lab work in January with Dr. Loreli.  Hypertension -well controlled today - Continue Benicar 20 mg daily  Bruit -Not identified on exam today        Dispo: He can follow-up in 6 weeks to review testing  Signed, [name] Fabry, PA-C

## 2023-11-19 ENCOUNTER — Ambulatory Visit: Attending: Cardiovascular Disease | Admitting: Physician Assistant

## 2023-11-19 VITALS — BP 132/66 | HR 86 | Ht 70.0 in | Wt 200.0 lb

## 2023-11-19 DIAGNOSIS — I2510 Atherosclerotic heart disease of native coronary artery without angina pectoris: Secondary | ICD-10-CM

## 2023-11-19 DIAGNOSIS — E7800 Pure hypercholesterolemia, unspecified: Secondary | ICD-10-CM

## 2023-11-19 DIAGNOSIS — R0989 Other specified symptoms and signs involving the circulatory and respiratory systems: Secondary | ICD-10-CM

## 2023-11-19 DIAGNOSIS — I10 Essential (primary) hypertension: Secondary | ICD-10-CM

## 2023-11-19 DIAGNOSIS — E782 Mixed hyperlipidemia: Secondary | ICD-10-CM

## 2023-11-19 DIAGNOSIS — I2583 Coronary atherosclerosis due to lipid rich plaque: Secondary | ICD-10-CM

## 2023-11-19 MED ORDER — METOPROLOL TARTRATE 100 MG PO TABS: 100.0000 mg | ORAL_TABLET | Freq: Once | ORAL | 0 refills | Status: DC

## 2023-11-19 MED ORDER — ROSUVASTATIN CALCIUM 20 MG PO TABS: 40.0000 mg | ORAL_TABLET | Freq: Every day | ORAL | Status: AC

## 2023-11-19 NOTE — Addendum Note (Signed)
 Addended by: Meira Wahba on: 11/19/2023 10:12 AM   Modules accepted: Orders

## 2023-11-19 NOTE — Patient Instructions (Signed)
 Medication Instructions:  Your physician recommends that you continue on your current medications as directed. Please refer to the Current Medication list given to you today.  *If you need a refill on your cardiac medications before your next appointment, please call your pharmacy*  Lab Work: TODAY: BMET If you have labs (blood work) drawn today and your tests are completely normal, you will receive your results only by: MyChart Message (if you have MyChart) OR A paper copy in the mail If you have any lab test that is abnormal or we need to change your treatment, we will call you to review the results.  Testing/Procedures: CORONARY CT-A  Follow-Up: At [facility], you and your health needs are our priority.  As part of our continuing mission to provide you with exceptional heart care, our providers are all part of one team.  This team includes your primary Cardiologist (physician) and Advanced Practice Providers or APPs (Physician Assistants and Nurse Practitioners) who all work together to provide you with the care you need, when you need it.  Your next appointment:   6 week(s)  Provider:   ORREN FABRY, PA-C  We recommend signing up for the patient portal called MyChart.  Sign up information is provided on this After Visit Summary.  MyChart is used to connect with patients for Virtual Visits (Telemedicine).  Patients are able to view lab/test results, encounter notes, upcoming appointments, etc.  Non-urgent messages can be sent to your provider as well.   To learn more about what you can do with MyChart, go to [email or web address].   Other Instructions   Your cardiac CT will be scheduled at one of the below locations:   [facility] [address] [place], [place] [address] [phone]  OR   [facility] [address]  [place], [place] [address]  If scheduled at [facility], please arrive at the [facility] and  Children's Entrance (Entrance C2) of [facility] 30 minutes prior to test start time. You can use the FREE valet parking offered at entrance C (encouraged to control the heart rate for the test)  Proceed to the [facility] Radiology Department (first floor) to check-in and test prep.  All radiology patients and guests should use entrance C2 at [facility], accessed from [facility], even though the hospital's physical address listed is [address].  If scheduled at the Heart and Vascular Tower at [address], please enter the parking lot using the [address] entrance and use the FREE valet service at the patient drop-off area. Enter the building and check-in with registration on the main floor.  Please follow these instructions carefully (unless otherwise directed):  An IV will be required for this test and Nitroglycerin  will be given.  Hold all erectile dysfunction medications at least 3 days (72 hrs) prior to test. (Ie viagra, cialis, sildenafil, tadalafil, etc)   On the Night Before the Test: Be sure to Drink plenty of water . Do not consume any caffeinated/decaffeinated beverages or chocolate 12 hours prior to your test. Do not take any antihistamines 12 hours prior to your test  On the Day of the Test: Drink plenty of water  until 1 hour prior to the test. Do not eat any food 1 hour prior to test. You may take your regular medications prior to the test.  Take metoprolol  (Lopressor ) 100 MG two hours prior to test. If you take Furosemide/Hydrochlorothiazide/Spironolactone/Chlorthalidone, please HOLD on the  morning of the test. Patients who wear a continuous glucose monitor MUST remove the device prior to scanning.      After the Test: Drink plenty of water . After receiving IV contrast, you may experience a mild flushed feeling. This is normal. On occasion, you may experience a mild rash up to 24 hours after the test. This is not dangerous.  If this occurs, you can take Benadryl  25 mg, Zyrtec, Claritin, or Allegra and increase your fluid intake. (Patients taking Tikosyn should avoid Benadryl , and may take Zyrtec, Claritin, or Allegra) If you experience trouble breathing, this can be serious. If it is severe call 911 IMMEDIATELY. If it is mild, please call our office.  We will call to schedule your test 2-4 weeks out understanding that some insurance companies will need an authorization prior to the service being performed.   For more information and frequently asked questions, please visit our website : [email or web address]  For non-scheduling related questions, please contact the cardiac imaging nurse navigator should you have any questions/concerns: Cardiac Imaging Nurse Navigators Direct Office Dial: [phone]   For scheduling needs, including cancellations and rescheduling, please call [place], [phone].

## 2023-11-20 ENCOUNTER — Ambulatory Visit: Payer: Self-pay | Admitting: Physician Assistant

## 2023-11-20 LAB — BASIC METABOLIC PANEL WITH GFR
BUN/Creatinine Ratio: 22 (ref 10–24)
BUN: 18 mg/dL (ref 8–27)
CO2: 23 mmol/L (ref 20–29)
Calcium: 9.6 mg/dL (ref 8.6–10.2)
Chloride: 102 mmol/L (ref 96–106)
Creatinine, Ser: 0.83 mg/dL (ref 0.76–1.27)
Glucose: 92 mg/dL (ref 70–99)
Potassium: 4.7 mmol/L (ref 3.5–5.2)
Sodium: 140 mmol/L (ref 134–144)
eGFR: 92 mL/min/1.73 (ref >59)

## 2023-11-22 ENCOUNTER — Ambulatory Visit (HOSPITAL_COMMUNITY)
Admission: RE | Admit: 2023-11-22 | Discharge: 2023-11-22 | Disposition: A | Discharge status: Home / Self Care | Source: Ambulatory Visit | Attending: Physician Assistant | Admitting: Physician Assistant

## 2023-11-22 ENCOUNTER — Other Ambulatory Visit: Payer: Self-pay | Admitting: Internal Medicine

## 2023-11-22 ENCOUNTER — Ambulatory Visit (HOSPITAL_BASED_OUTPATIENT_CLINIC_OR_DEPARTMENT_OTHER)
Admission: RE | Admit: 2023-11-22 | Discharge: 2023-11-22 | Disposition: A | Discharge status: Home / Self Care | Payer: Self-pay | Source: Ambulatory Visit | Attending: Internal Medicine | Admitting: Internal Medicine

## 2023-11-22 DIAGNOSIS — R931 Abnormal findings on diagnostic imaging of heart and coronary circulation: Secondary | ICD-10-CM

## 2023-11-22 DIAGNOSIS — I2510 Atherosclerotic heart disease of native coronary artery without angina pectoris: Secondary | ICD-10-CM | POA: Diagnosis not present

## 2023-11-22 DIAGNOSIS — I2583 Coronary atherosclerosis due to lipid rich plaque: Secondary | ICD-10-CM | POA: Insufficient documentation

## 2023-11-22 DIAGNOSIS — Q2112 Patent foramen ovale: Secondary | ICD-10-CM | POA: Diagnosis not present

## 2023-11-22 DIAGNOSIS — R079 Chest pain, unspecified: Secondary | ICD-10-CM | POA: Diagnosis not present

## 2023-11-22 DIAGNOSIS — I2584 Coronary atherosclerosis due to calcified coronary lesion: Secondary | ICD-10-CM | POA: Diagnosis not present

## 2023-11-22 MED ORDER — NITROGLYCERIN 0.4 MG SL SUBL
0.8000 mg | SUBLINGUAL_TABLET | Freq: Once | SUBLINGUAL | Status: AC
  Administered 2023-11-22: 0.8 mg via SUBLINGUAL

## 2023-11-22 MED ORDER — IOHEXOL 350 MG/ML SOLN
100.0000 mL | Freq: Once | INTRAVENOUS | Status: AC | PRN
  Administered 2023-11-22: 100 mL via INTRAVENOUS

## 2023-11-23 ENCOUNTER — Ambulatory Visit: Payer: Self-pay | Admitting: Physician Assistant

## 2023-11-27 ENCOUNTER — Other Ambulatory Visit: Payer: Self-pay | Admitting: Medical Genetics

## 2023-11-27 NOTE — Telephone Encounter (Signed)
   Patient Name: Adrian [name]  DOB: April 08, 1950 MRN: [id number]  The patient understands that risks include but are not limited to stroke (1 in 1000), death (1 in 1000), kidney failure [usually temporary] (1 in 500), bleeding (1 in 200), allergic reaction [possibly serious] (1 in 200), and agrees to proceed.    He prefers Tuesday with Dr. Wonda.    [name] Fabry, PA-C 11/27/2023, 11:51 AM

## 2023-11-28 ENCOUNTER — Telehealth: Payer: Self-pay | Admitting: Cardiology

## 2023-11-28 NOTE — Telephone Encounter (Signed)
  Patient said, he is returning call from [facility] nurse. Also he needs a new bottle of nitroglycerin  (not on med list) sent to his new pharmacy [facility] Address: [address] [place], [place], [place] [address] Phone: [phone]

## 2023-11-29 ENCOUNTER — Other Ambulatory Visit: Payer: Self-pay | Admitting: *Deleted

## 2023-11-29 ENCOUNTER — Telehealth: Payer: Self-pay | Admitting: *Deleted

## 2023-11-29 DIAGNOSIS — Z01812 Encounter for preprocedural laboratory examination: Secondary | ICD-10-CM

## 2023-11-29 DIAGNOSIS — R931 Abnormal findings on diagnostic imaging of heart and coronary circulation: Secondary | ICD-10-CM

## 2023-11-29 MED ORDER — NITROGLYCERIN 0.4 MG SL SUBL: 0.4000 mg | SUBLINGUAL_TABLET | SUBLINGUAL | 3 refills | Status: AC | PRN

## 2023-11-29 NOTE — Telephone Encounter (Addendum)
 Cardiac Catheterization scheduled at [facility] for: Tuesday December 04, 2023 7:30 AM Arrival time [facility] Main Entrance A at: 5:30 AM  Diet: -Nothing to eat after midnight.  Hydration: -May drink clear liquids until 2 hours before the procedure.  Approved liquids: Water , clear tea, black coffee, fruit juices-non-citric and without pulp,Gatorade, plain Jello/popsicles.   -Please drink 16 oz bottle of water  2 hours before procedure.  Medication instructions: -Usual morning medications can be taken including aspirin  81 mg.  Plan to go home the same day, you will only stay overnight if medically necessary.  You must have responsible adult to drive you home.  Someone must be with you the first 24 hours after you arrive home.  Reviewed procedure instructions with patient.  Patient tells me he will go to the [facility] today for pre-procedure CBC.  Patient requesting NTG prescription -okay per Orren Fabry, PA to send in prescription for NTG 0.4 SL prn chest pain.

## 2023-11-29 NOTE — Addendum Note (Signed)
 Addended by: Laporche Martelle M on: 11/29/2023 11:26 AM   Modules accepted: Orders

## 2023-11-30 DIAGNOSIS — R931 Abnormal findings on diagnostic imaging of heart and coronary circulation: Secondary | ICD-10-CM | POA: Diagnosis not present

## 2023-11-30 DIAGNOSIS — Z01812 Encounter for preprocedural laboratory examination: Secondary | ICD-10-CM | POA: Diagnosis not present

## 2023-11-30 DIAGNOSIS — Z01810 Encounter for preprocedural cardiovascular examination: Secondary | ICD-10-CM | POA: Diagnosis not present

## 2023-12-01 LAB — CBC
Hematocrit: 45.2 % (ref 37.5–51.0)
Hemoglobin: 14.6 g/dL (ref 13.0–17.7)
MCH: 28.2 pg (ref 26.6–33.0)
MCHC: 32.3 g/dL (ref 31.5–35.7)
MCV: 87 fL (ref 79–97)
Platelets: 263 x10E3/uL (ref 150–450)
RBC: 5.18 x10E6/uL (ref 4.14–5.80)
RDW: 13.4 % (ref 11.6–15.4)
WBC: 6.6 x10E3/uL (ref 3.4–10.8)

## 2023-12-04 ENCOUNTER — Encounter (HOSPITAL_COMMUNITY)
Admission: RE | Disposition: A | Discharge status: Home / Self Care | Payer: Self-pay | Source: Home / Self Care | Attending: Cardiovascular Disease

## 2023-12-04 ENCOUNTER — Encounter (HOSPITAL_COMMUNITY): Payer: Self-pay | Admitting: Cardiovascular Disease

## 2023-12-04 ENCOUNTER — Other Ambulatory Visit (HOSPITAL_COMMUNITY): Payer: Self-pay

## 2023-12-04 ENCOUNTER — Ambulatory Visit: Payer: Self-pay | Admitting: Physician Assistant

## 2023-12-04 ENCOUNTER — Other Ambulatory Visit: Payer: Self-pay

## 2023-12-04 ENCOUNTER — Ambulatory Visit (HOSPITAL_COMMUNITY)
Admission: RE | Admit: 2023-12-04 | Discharge: 2023-12-04 | Disposition: A | Discharge status: Home / Self Care | Attending: Cardiovascular Disease | Admitting: Cardiovascular Disease

## 2023-12-04 DIAGNOSIS — I10 Essential (primary) hypertension: Secondary | ICD-10-CM | POA: Diagnosis not present

## 2023-12-04 DIAGNOSIS — I2584 Coronary atherosclerosis due to calcified coronary lesion: Secondary | ICD-10-CM | POA: Insufficient documentation

## 2023-12-04 DIAGNOSIS — I200 Unstable angina: Secondary | ICD-10-CM | POA: Diagnosis present

## 2023-12-04 DIAGNOSIS — Z955 Presence of coronary angioplasty implant and graft: Secondary | ICD-10-CM

## 2023-12-04 DIAGNOSIS — R0989 Other specified symptoms and signs involving the circulatory and respiratory systems: Secondary | ICD-10-CM | POA: Insufficient documentation

## 2023-12-04 DIAGNOSIS — I25110 Atherosclerotic heart disease of native coronary artery with unstable angina pectoris: Secondary | ICD-10-CM | POA: Diagnosis not present

## 2023-12-04 DIAGNOSIS — E785 Hyperlipidemia, unspecified: Secondary | ICD-10-CM | POA: Insufficient documentation

## 2023-12-04 DIAGNOSIS — Z79899 Other long term (current) drug therapy: Secondary | ICD-10-CM | POA: Diagnosis not present

## 2023-12-04 DIAGNOSIS — I2510 Atherosclerotic heart disease of native coronary artery without angina pectoris: Secondary | ICD-10-CM | POA: Diagnosis present

## 2023-12-04 HISTORY — PX: LEFT HEART CATH AND CORONARY ANGIOGRAPHY: CATH118249

## 2023-12-04 HISTORY — PX: CORONARY PRESSURE/FFR WITH 3D MAPPING: CATH118309

## 2023-12-04 HISTORY — PX: CORONARY STENT INTERVENTION: CATH118234

## 2023-12-04 HISTORY — PX: CORONARY LITHOTRIPSY: CATH118330

## 2023-12-04 LAB — POCT ACTIVATED CLOTTING TIME
Activated Clotting Time: 233 s
Activated Clotting Time: 285 s

## 2023-12-04 SURGERY — LEFT HEART CATH AND CORONARY ANGIOGRAPHY
Anesthesia: LOCAL

## 2023-12-04 MED ORDER — NITROGLYCERIN 0.4 MG SL SUBL: 0.4000 mg | SUBLINGUAL_TABLET | SUBLINGUAL | Status: DC | PRN

## 2023-12-04 MED ORDER — HEPARIN SODIUM (PORCINE) 1000 UNIT/ML IJ SOLN
INTRAMUSCULAR | Status: AC
Start: 2023-12-04 — End: 2023-12-04
  Filled 2023-12-04: qty 10

## 2023-12-04 MED ORDER — SODIUM CHLORIDE 0.9 % IV SOLN
INTRAVENOUS | Status: DC | PRN
  Administered 2023-12-04: 10 mL/h via INTRAVENOUS

## 2023-12-04 MED ORDER — LABETALOL HCL 5 MG/ML IV SOLN: 10.0000 mg | INTRAVENOUS | Status: DC | PRN

## 2023-12-04 MED ORDER — SODIUM CHLORIDE 0.9% FLUSH: 3.0000 mL | INTRAVENOUS | Status: DC | PRN

## 2023-12-04 MED ORDER — IOHEXOL 350 MG/ML SOLN
INTRAVENOUS | Status: DC | PRN
  Administered 2023-12-04: 175 mL

## 2023-12-04 MED ORDER — FREE WATER: 500.0000 mL | Freq: Once | Status: DC

## 2023-12-04 MED ORDER — VERAPAMIL HCL 2.5 MG/ML IV SOLN
INTRAVENOUS | Status: AC
  Filled 2023-12-04: qty 2

## 2023-12-04 MED ORDER — FREE WATER
500.0000 mL | Freq: Once | Status: AC
  Administered 2023-12-04: 500 mL via ORAL

## 2023-12-04 MED ORDER — CLOPIDOGREL BISULFATE 300 MG PO TABS
ORAL_TABLET | ORAL | Status: AC
  Filled 2023-12-04: qty 2

## 2023-12-04 MED ORDER — HEPARIN (PORCINE) IN NACL 1000-0.9 UT/500ML-% IV SOLN
INTRAVENOUS | Status: DC | PRN
  Administered 2023-12-04 (×2): 500 mL

## 2023-12-04 MED ORDER — ASPIRIN 81 MG PO CHEW
81.0000 mg | CHEWABLE_TABLET | ORAL | Status: AC
  Administered 2023-12-04: 81 mg via ORAL
  Filled 2023-12-04: qty 1

## 2023-12-04 MED ORDER — IRBESARTAN 75 MG PO TABS: 75.0000 mg | ORAL_TABLET | Freq: Every day | ORAL | Status: DC

## 2023-12-04 MED ORDER — SODIUM CHLORIDE 0.9% FLUSH: 3.0000 mL | Freq: Two times a day (BID) | INTRAVENOUS | Status: DC

## 2023-12-04 MED ORDER — FENTANYL CITRATE (PF) 100 MCG/2ML IJ SOLN
INTRAMUSCULAR | Status: DC | PRN
  Administered 2023-12-04: 50 ug via INTRAVENOUS

## 2023-12-04 MED ORDER — CLOPIDOGREL BISULFATE 75 MG PO TABS: 75.0000 mg | ORAL_TABLET | Freq: Every day | ORAL | Status: DC

## 2023-12-04 MED ORDER — ONDANSETRON HCL 4 MG/2ML IJ SOLN: 4.0000 mg | Freq: Four times a day (QID) | INTRAMUSCULAR | Status: DC | PRN

## 2023-12-04 MED ORDER — LIDOCAINE HCL (PF) 1 % IJ SOLN
INTRAMUSCULAR | Status: DC | PRN
  Administered 2023-12-04: 2 mL via INTRADERMAL

## 2023-12-04 MED ORDER — HYDRALAZINE HCL 20 MG/ML IJ SOLN: 10.0000 mg | INTRAMUSCULAR | Status: DC | PRN

## 2023-12-04 MED ORDER — FAMOTIDINE IN NACL 20-0.9 MG/50ML-% IV SOLN
INTRAVENOUS | Status: AC
  Filled 2023-12-04: qty 50

## 2023-12-04 MED ORDER — VERAPAMIL HCL 2.5 MG/ML IV SOLN
INTRAVENOUS | Status: DC | PRN
  Administered 2023-12-04: 10 mL via INTRA_ARTERIAL

## 2023-12-04 MED ORDER — ROSUVASTATIN CALCIUM 20 MG PO TABS: 20.0000 mg | ORAL_TABLET | Freq: Every day | ORAL | Status: DC

## 2023-12-04 MED ORDER — ACETAMINOPHEN 325 MG PO TABS: 650.0000 mg | ORAL_TABLET | ORAL | Status: DC | PRN

## 2023-12-04 MED ORDER — FENTANYL CITRATE (PF) 100 MCG/2ML IJ SOLN
INTRAMUSCULAR | Status: AC
  Filled 2023-12-04: qty 2

## 2023-12-04 MED ORDER — MIDAZOLAM HCL 2 MG/2ML IJ SOLN
INTRAMUSCULAR | Status: DC | PRN
  Administered 2023-12-04: 2 mg via INTRAVENOUS

## 2023-12-04 MED ORDER — HEPARIN SODIUM (PORCINE) 1000 UNIT/ML IJ SOLN
INTRAMUSCULAR | Status: AC
  Filled 2023-12-04: qty 10

## 2023-12-04 MED ORDER — SODIUM CHLORIDE 0.9 % IV SOLN: 250.0000 mL | INTRAVENOUS | Status: DC | PRN

## 2023-12-04 MED ORDER — CLOPIDOGREL BISULFATE 75 MG PO TABS: 75.0000 mg | ORAL_TABLET | Freq: Every day | ORAL | 1 refills | Status: DC

## 2023-12-04 MED ORDER — MIDAZOLAM HCL 2 MG/2ML IJ SOLN
INTRAMUSCULAR | Status: AC
  Filled 2023-12-04: qty 2

## 2023-12-04 MED ORDER — HEPARIN SODIUM (PORCINE) 1000 UNIT/ML IJ SOLN
INTRAMUSCULAR | Status: DC | PRN
  Administered 2023-12-04: 4000 [IU] via INTRAVENOUS
  Administered 2023-12-04 (×2): 5000 [IU] via INTRAVENOUS

## 2023-12-04 MED ORDER — CLOPIDOGREL BISULFATE 75 MG PO TABS
75.0000 mg | ORAL_TABLET | Freq: Every day | ORAL | 1 refills | Status: AC
  Filled 2023-12-04: qty 90, 90d supply, fill #0

## 2023-12-04 MED ORDER — SODIUM CHLORIDE 0.9 % IV SOLN
250.0000 mL | INTRAVENOUS | Status: DC | PRN
Start: 2023-12-04 — End: 2023-12-04

## 2023-12-04 MED ORDER — CLOPIDOGREL BISULFATE 300 MG PO TABS
ORAL_TABLET | ORAL | Status: DC | PRN
  Administered 2023-12-04: 600 mg via ORAL

## 2023-12-04 SURGICAL SUPPLY — 17 items
BALLOON EMERGE MR 2.0X15 (BALLOONS) IMPLANT
BALLOON ~~LOC~~ EMERGE MR 2.5X15 (BALLOONS) IMPLANT
BALLOON ~~LOC~~ EUPHORA RX 3.0X12 (BALLOONS) IMPLANT
CATH INFINITI 5 FR JL3.5 (CATHETERS) IMPLANT
CATH INFINITI JR4 5F (CATHETERS) IMPLANT
CATH SHOCKWAVE 2.5X12 (CATHETERS) IMPLANT
CATH VISTA GUIDE 6FR XBLD 3.5 (CATHETERS) IMPLANT
DEVICE RAD COMP TR BAND LRG (VASCULAR PRODUCTS) IMPLANT
GLIDESHEATH SLEND SS 6F .021 (SHEATH) IMPLANT
GUIDEWIRE INQWIRE 1.5J.035X260 (WIRE) IMPLANT
GUIDEWIRE PRESSURE X 175 (WIRE) IMPLANT
KIT ENCORE 26 ADVANTAGE (KITS) IMPLANT
KIT ESSENTIALS PG (KITS) IMPLANT
PACK CARDIAC CATHETERIZATION (CUSTOM PROCEDURE TRAY) ×2 IMPLANT
SET ATX-X65L (MISCELLANEOUS) IMPLANT
STENT SYNERGY XD 2.75X24 (Permanent Stent) IMPLANT
WIRE ASAHI PROWATER 180CM (WIRE) IMPLANT

## 2023-12-04 NOTE — Interval H&P Note (Signed)
 History and Physical Interval Note:  12/04/2023 7:59 AM  Adrian [name]  has presented today for surgery, with the diagnosis of abnormal cta.  The various methods of treatment have been discussed with the patient and family. After consideration of risks, benefits and other options for treatment, the patient has consented to  Procedure(s): LEFT HEART CATH AND CORONARY ANGIOGRAPHY (N/A) as a surgical intervention.  The patient's history has been reviewed, patient examined, no change in status, stable for surgery.  I have reviewed the patient's chart and labs.  Questions were answered to the patient's satisfaction.    Cath Lab Visit (complete for each Cath Lab visit)  Clinical Evaluation Leading to the Procedure:   ACS: No.  Non-ACS:    Anginal Classification: CCS III  Anti-ischemic medical therapy: No Therapy  Non-Invasive Test Results: High-risk stress test findings: cardiac mortality >3%/year (Coronary CTA with severe LAD stenosis)  Prior CABG: No previous CABG        [name]

## 2023-12-04 NOTE — Discharge Instructions (Addendum)
Drink plenty of fluid for 48 hours and keep wrist elevated at heart level for 24 hours  Radial Site Care   This sheet gives you information about how to care for yourself after your procedure. Your health care provider may also give you more specific instructions. If you have problems or questions, contact your health care provider. What can I expect after the procedure? After the procedure, it is common to have: Bruising and tenderness at the catheter insertion area. Follow these instructions at home: Medicines Take over-the-counter and prescription medicines only as told by your health care provider. Insertion site care Follow instructions from your health care provider about how to take care of your insertion site. Make sure you: Wash your hands with soap and water before you change your bandage (dressing). If soap and water are not available, use hand sanitizer. remove your dressing as told by your health care provider. In 24 hours Check your insertion site every day for signs of infection. Check for: Redness, swelling, or pain. Fluid or blood. Pus or a bad smell. Warmth. Do not take baths, swim, or use a hot tub until your health care provider approves. You may shower 24-48 hours after the procedure, or as directed by your health care provider. Remove the dressing and gently wash the site with plain soap and water. Pat the area dry with a clean towel. Do not rub the site. That could cause bleeding. Do not apply powder or lotion to the site. Activity   For 24 hours after the procedure, or as directed by your health care provider: Do not flex or bend the affected arm. Do not push or pull heavy objects with the affected arm. Do not drive yourself home from the hospital or clinic. You may drive 24 hours after the procedure unless your health care provider tells you not to. Do not operate machinery or power tools. Do not lift anything that is heavier than 10 lb (4.5 kg), or the  limit that you are told, until your health care provider says that it is safe. For 4 days Ask your health care provider when it is okay to: Return to work or school. Resume usual physical activities or sports. Resume sexual activity. General instructions If the catheter site starts to bleed, raise your arm and put firm pressure on the site. If the bleeding does not stop, get help right away. This is a medical emergency. If you went home on the same day as your procedure, a responsible adult should be with you for the first 24 hours after you arrive home. Keep all follow-up visits as told by your health care provider. This is important. Contact a health care provider if: You have a fever. You have redness, swelling, or yellow drainage around your insertion site. Get help right away if: You have unusual pain at the radial site. The catheter insertion area swells very fast. The insertion area is bleeding, and the bleeding does not stop when you hold steady pressure on the area. Your arm or hand becomes pale, cool, tingly, or numb. These symptoms may represent a serious problem that is an emergency. Do not wait to see if the symptoms will go away. Get medical help right away. Call your local emergency services (911 in the [place] Do not drive yourself to the hospital. Summary After the procedure, it is common to have bruising and tenderness at the site. Follow instructions from your health care provider about how to take care of your radial   site wound. Check the wound every day for signs of infection. Do not lift anything that is heavier than 10 lb (4.5 kg), or the limit that you are told, until your health care provider says that it is safe. This information is not intended to replace advice given to you by your health care provider. Make sure you discuss any questions you have with your health care provider. Document Revised: 04/25/2017 Document Reviewed: 04/25/2017 Elsevier Patient Education   2020 [employer].    Information about your medication: Plavix (anti-platelet agent)  Generic Name (Brand): clopidogrel (Plavix), once daily medication  PURPOSE: You are taking this medication along with aspirin to lower your chance of having a heart attack, stroke, or blood clots in your heart stent. These can be fatal. Plavix and aspirin help prevent platelets from sticking together and forming a clot that can block an artery or your stent.   Common SIDE EFFECTS you may experience include: bruising or bleeding more easily, shortness of breath  Do not stop taking PLAVIX without talking to the doctor who prescribes it for you. People who are treated with a stent and stop taking Plavix too soon, have a higher risk of getting a blood clot in the stent, having a heart attack, or dying. If you stop Plavix because of bleeding, or for other reasons, your risk of a heart attack or stroke may increase.   Avoid taking NSAID agents or anti-inflammatory medications such as ibuprofen, naproxen given increased bleed risk with plavix - can use acetaminophen (Tylenol) if needed for pain.  Avoid taking over the counter stomach medications omeprazole (Prilosec) or esomeprazole (Nexium) since these do interact and make plavix less effective - ask your pharmacist or doctor for alterative agents if needed for heartburn or GERD.   Tell all of your doctors and dentists that you are taking Plavix. They should talk to the doctor who prescribed Plavix for you before you have any surgery or invasive procedure.   Contact your health care provider if you experience: severe or uncontrollable bleeding, pink/red/brown urine, vomiting blood or vomit that looks like "coffee grounds", red or black stools (looks like tar), coughing up blood or blood clots ----------------------------------------------------------------------------------------------------------------------

## 2023-12-04 NOTE — Progress Notes (Signed)
 CARDIAC REHAB PHASE I     Post stent education including site care, restrictions, risk factors, exercise guidelines, NTG use, antiplatelet therapy importance, heart healthy diet and CRP2 reviewed. All questions and concerns addressed. Will refer to [facility] for CRP2. Plan for home later today.    [id number] Vaughn Asberry Hacking, RN BSN 12/04/2023 12:52 PM

## 2023-12-04 NOTE — Discharge Summary (Signed)
 Discharge Summary for Same Day PCI   Patient ID: MERL BOMMARITO MRN: [id number]; DOB: [birth year]  Admit date: 12/04/2023 Discharge date: 12/04/2023  Primary Care Provider: Loreli Elsie [name], MD  Primary Cardiologist: Redell Shallow, MD  Primary Electrophysiologist:  None   Discharge Diagnoses    Active Problems:   Coronary artery disease due to calcified coronary lesion    Diagnostic Studies/Procedures    Cardiac Catheterization 12/04/2023:   Prox RCA lesion is 50% stenosed.   Ost RCA to Prox RCA lesion is 20% stenosed.   RPDA lesion is 50% stenosed.   Mid Cx lesion is 40% stenosed.   Mid LAD lesion is 80% stenosed.   A drug-eluting stent was successfully placed using a STENT SYNERGY XD [id number].   Post intervention, there is a 0% residual stenosis.   The left ventricular systolic function is normal.   LV end diastolic pressure is mildly elevated.   The left ventricular ejection fraction is 55-65% by visual estimate.   Severe, heavily calcified mid LAD stenosis.  Successful PTCA/intracoronary lithotripsy/DES x 1 mid LAD Mild non flow limiting stenosis in the proximal to mid Circumflex Moderate, calcific,  non flow limiting stenosis in the mid RCA.  LVEDP=16 mmHg   Recommendations: Continue DAPT with ASA and Plavix  for at least six months. Continue statin. Same day post PCI discharge.  _____________   History of Present Illness     Adrian [name] is a 73 y.o. male with history of CAD, hyperlipidemia, hypertension.   Outpatient today had been complaining of intermittent chest pain.  Coronary CTA was ordered that demonstrated significant stenosis with flow-limiting lesion in the mid LAD.  Cardiac catheterization was arranged for further evaluation.  Hospital Course     The patient underwent cardiac cath as noted above with heavily calcified mid LAD stenosis status post PTCA/intracoronary lithotripsy with DES x 1 to the mid LAD Plan for DAPT with ASA/Plavix  for at least 6  months.  The patient was seen by cardiac rehab while in short stay. There were no observed complications post cath. Radial cath site was re-evaluated prior to discharge and found to be stable without any complications. Instructions/precautions regarding cath site care were given prior to discharge.  Adrian [name] was seen by Dr. Verlin and determined stable for discharge home. Follow up with our office has been arranged. Medications are listed below. Pertinent changes includes  Stopping ibuprofen Encouraged to check blood pressure and log this for the next week and bring to upcoming appointment  Patient seen and examined by myself and Dr. Verlin deemed stable for discharge.  The Plavix  prescription was sent to [facility]. Follow-up has been arranged.  Post cath instructions in discharge.   _____________  Cath/PCI Registry Performance & Quality Measures: Aspirin  prescribed? - Yes ADP Receptor Inhibitor (Plavix /Clopidogrel , Brilinta/Ticagrelor or Effient/Prasugrel) prescribed (includes medically managed patients)? - Yes High Intensity Statin (Lipitor 40-80mg  or Crestor  20-40mg ) prescribed? - Yes For EF <40%, was ACEI/ARB prescribed? - Not Applicable (EF >/= 40%) For EF <40%, Aldosterone Antagonist (Spironolactone or Eplerenone) prescribed? - Not Applicable (EF >/= 40%) Cardiac Rehab Phase II ordered (Included Medically managed Patients)? - Yes  _____________   Discharge Vitals Blood pressure 122/67, pulse 80, temperature 97.7 F (36.5 C), temperature source Oral, resp. rate 16, height 5' 10 (1.778 m), weight 88.5 kg, SpO2 98%.  Filed Weights   12/04/23 0549  Weight: 88.5 kg    Last Labs & Radiologic Studies    CBC No results for  input(s): WBC, NEUTROABS, HGB, HCT, MCV, PLT in the last 72 hours. Basic Metabolic Panel No results for input(s): NA, K, CL, CO2, GLUCOSE, BUN, CREATININE, CALCIUM , MG, PHOS in the last 72 hours. Liver Function Tests No results  for input(s): AST, ALT, ALKPHOS, BILITOT, PROT, ALBUMIN in the last 72 hours. No results for input(s): LIPASE, AMYLASE in the last 72 hours. High Sensitivity Troponin:   No results for input(s): TROPONINIHS in the last 720 hours.  BNP Invalid input(s): POCBNP D-Dimer No results for input(s): DDIMER in the last 72 hours. Hemoglobin A1C No results for input(s): HGBA1C in the last 72 hours. Fasting Lipid Panel No results for input(s): CHOL, HDL, LDLCALC, TRIG, CHOLHDL, LDLDIRECT in the last 72 hours. Thyroid Function Tests No results for input(s): TSH, T4TOTAL, T3FREE, THYROIDAB in the last 72 hours.  Invalid input(s): FREET3 _____________  CARDIAC CATHETERIZATION Result Date: 12/04/2023   Prox RCA lesion is 50% stenosed.   Ost RCA to Prox RCA lesion is 20% stenosed.   RPDA lesion is 50% stenosed.   Mid Cx lesion is 40% stenosed.   Mid LAD lesion is 80% stenosed.   A drug-eluting stent was successfully placed using a STENT SYNERGY XD [id number].   Post intervention, there is a 0% residual stenosis.   The left ventricular systolic function is normal.   LV end diastolic pressure is mildly elevated.   The left ventricular ejection fraction is 55-65% by visual estimate. Severe, heavily calcified mid LAD stenosis. Successful PTCA/intracoronary lithotripsy/DES x 1 mid LAD Mild non flow limiting stenosis in the proximal to mid Circumflex Moderate, calcific,  non flow limiting stenosis in the mid RCA. LVEDP=16 mmHg Recommendations: Continue DAPT with ASA and Plavix  for at least six months. Continue statin. Same day post PCI discharge.   CT CORONARY MORPH W/CTA COR W/SCORE W/CA W/CM &/OR WO/CM Addendum Date: 11/23/2023 ADDENDUM REPORT: 11/23/2023 09:59 CLARIFICATION TO IMPRESSIONS: CLARIFICATION TO IMPRESSIONS 1. Severe CAD in the mid LAD, 70-99 % stenosis, CADRADS 4 . CT FFR will be performed and reported separately. 2. Total plaque volume 1587 mm3 (calcified  plaque 536 mm3; non-calcified plaque 1051 mm3), which is 89th percentile for age- and sex-matched controls. TPV is extensive . 3. Coronary calcium  score is 3327 , which places the patient in the 96th percentile for age and sex matched control. 4. Normal coronary origins with right dominance. 5. 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   On: 11/23/2023 09:59   Result Date: 11/23/2023 CLINICAL DATA:  Chest Pain EXAM: Cardiac/Coronary CTA TECHNIQUE: A non-contrast, gated CT scan was obtained with axial slices of 2.5 mm through the heart for calcium  scoring. Calcium  scoring was performed using the Agatston method. A 120 kV prospective, gated, contrast cardiac CT scan was obtained. Gantry rotation speed was 230 msec and collimation was 0.63 mm. Two sublingual nitroglycerin  tablets (0.8 mg) were given. The 3D data set was reconstructed with motion correction for the best systolic or diastolic phase. Images were analyzed on a dedicated workstation using MPR, MIP, and VRT modes. The patient received 95 cc of contrast. FINDINGS: Image quality: Good Noise artifact is: Limited Coronary calcium  score is 3327, which places the patient in the 96th percentile for age and sex matched control. Coronary arteries: Normal coronary origins.  Right dominance. Right Coronary Artery: Moderate plaque proximal RCA, 50-69% stenosis. Mild plaque mid RCA, 25-49% stenosis. Left Main Coronary Artery: No detectable plaque or stenosis. Left Anterior Descending Coronary Artery: Moderate  plaque proximal LAD, 50-69% stenosis. Severe plaque mid LAD, long segment lesion (29 mm), 70-99% stenosis. Small first diagonal, normal caliber second diagonal with no obstructive disease seen. Left Circumflex Artery: Moderate plaque proximal LCx, 50-69% stenosis. Grossly normal OM 1 and 2. Aorta: Normal size, 33 mm at the mid ascending aorta (level of the PA bifurcation) measured double oblique. Aortic Valve: Trivial  calcifications. AV calcium  score 87. Other findings: Normal variant pulmonary vein drainage into the left atrium, common antrum of left sided veins. Normal left atrial appendage without thrombus. Normal size of the pulmonary artery. 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 Please see separate report from [facility] for non-cardiac findings. IMPRESSION: 1. Severe CAD, 70-99% stenosis, CADRADS 4. CT FFR will be performed and reported separately. 2. Total plaque volume 1587 mm3 (calcified plaque 536 mm3; non-calcified plaque 1051 mm3), which is 89th percentile for age- and sex-matched controls. TPV is extensive. 3. Coronary calcium  score is 3327, which places the patient in the 96th percentile for age and sex matched control. 4. Normal coronary origins with right dominance. 5. 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 RECOMMENDATIONS: CAD-RADS 4 Severe stenosis. (70-99% or > 50% left main). Cardiac catheterization or CT FFR is recommended. Consider symptom-guided anti-ischemic pharmacotherapy as well as risk factor modification per guideline directed care. Electronically Signed: By: Soyla Merck M.D. On: 11/23/2023 09:54   CT CORONARY FRACTIONAL FLOW RESERVE FLUID ANALYSIS Result Date: 11/23/2023 EXAM: CT FFR analysis was performed on the original cardiac CTA dataset. Diagrammatic representation of the CT FFR analysis is provided in a separate PDF document in PACS. This dictation was created using the PDF document and an interactive 3D model of the results. The 3D model is not available in the EMR/PACS. INTERPRETATION: CT FFR provides simultaneous calculation of pressure and flow across the entire coronary tree. For clinical decision making, CT FFR values should be obtained 1-2 cm distal to the lower border of each stenosis measured. Coronary CTA-related artifacts may impair the diagnostic accuracy of the original cardiac CTA and FFR CT results. *Due to the fact that CT FFR represents a mathematically-derived  analysis, it is recommended that the results be interpreted as follows: 1. CT FFR >0.80: Low likelihood of hemodynamic significance. 2. CT FFR 0.76-0.80: Borderline likelihood of hemodynamic significance. 3. CT FFR =< 0.75: High likelihood of hemodynamic significance. *Coronary CT Angiography-derived Fractional Flow Reserve Testing in Patients with Stable Coronary Artery Disease: Recommendations on Interpretation and Reporting. Radiology: Cardiothoracic Imaging. 2019;1(5):e190050 FINDINGS: 1. Left Main: Low likelihood of hemodynamic significance. 2. LAD: High likelihood of hemodynamic significance in mid LAD lesion, FFR 0.68, delta FFR 0.27 3. LCX: Low likelihood of hemodynamic significance. 4. RCA: Low likelihood of hemodynamic significance. IMPRESSION: 1. CT FFR analysis showed hemodynamically significant stenosis. High likelihood of hemodynamic significance in mid LAD lesion, FFR 0.68, delta FFR 0.27 RECOMMENDATIONS: Consider cardiac catheterization. Guideline directed medical therapy for secondary prevention of CAD. Electronically Signed   By: Soyla Merck M.D.   On: 11/23/2023 09:56    Disposition   Pt is being discharged home today in good condition.  Follow-up Plans & Appointments     Follow-up Information     [name], PA-C Follow up.   Specialty: Cardiology Why: Tuesday Dec 18, 2023 Arrive by 8:30 AM Appt at 8:50 AM (25 min) Contact information: [address] [place] [address] [phone]                Discharge Instructions  Amb Referral to Cardiac Rehabilitation   Complete by: As directed    Diagnosis: Coronary Stents   After initial evaluation and assessments completed: Virtual Based Care may be provided alone or in conjunction with Phase 2 Cardiac Rehab based on patient barriers.: Yes   Intensive Cardiac Rehabilitation (ICR) [facility] location only OR Traditional Cardiac Rehabilitation (TCR) *If criteria for ICR are not met will enroll in TCR [facility]  only): Yes   Diet - low sodium heart healthy   Complete by: As directed    Discharge instructions   Complete by: As directed    Radial Site Care Refer to this sheet in the next few weeks. These instructions provide you with information on caring for yourself after your procedure. Your caregiver may also give you more specific instructions. Your treatment has been planned according to current medical practices, but problems sometimes occur. Call your caregiver if you have any problems or questions after your procedure. HOME CARE INSTRUCTIONS You may shower the day after the procedure. Remove the bandage (dressing) and gently wash the site with plain soap and water . Gently pat the site dry.  Do not apply powder or lotion to the site.  Do not submerge the affected site in water  for 3 to 5 days.  Inspect the site at least twice daily.  Do not flex or bend the affected arm for 24 hours.  No lifting over 5 pounds (2.3 kg) for 5 days after your procedure.  Do not drive home if you are discharged the same day of the procedure. Have someone else drive you.  You may drive 24 hours after the procedure unless otherwise instructed by your caregiver.  What to expect: Any bruising will usually fade within 1 to 2 weeks.  Blood that collects in the tissue (hematoma) may be painful to the touch. It should usually decrease in size and tenderness within 1 to 2 weeks.  SEEK IMMEDIATE MEDICAL CARE IF: You have unusual pain at the radial site.  You have redness, warmth, swelling, or pain at the radial site.  You have drainage (other than a small amount of blood on the dressing).  You have chills.  You have a fever or persistent symptoms for more than 72 hours.  You have a fever and your symptoms suddenly get worse.  Your arm becomes pale, cool, tingly, or numb.  You have heavy bleeding from the site. Hold pressure on the site.   Increase activity slowly   Complete by: As directed         Discharge  Medications   Allergies as of 12/04/2023   No Known Allergies      Medication List     STOP taking these medications    ibuprofen 200 MG tablet Commonly known as: ADVIL       TAKE these medications    acetaminophen  500 MG tablet Commonly known as: TYLENOL  Take 500 mg by mouth every 6 (six) hours as needed for moderate pain.   aspirin  EC 81 MG tablet Take 1 tablet (81 mg total) by mouth 2 (two) times daily. To be taken after surgery What changed: when to take this   clopidogrel  75 MG tablet Commonly known as: Plavix  Take 1 tablet (75 mg total) by mouth daily.   CoQ10 100 MG Caps Take 100 mg by mouth daily.   fluticasone  50 MCG/ACT nasal spray Commonly known as: FLONASE  Place 2 sprays into both nostrils daily as needed for allergies.   ketoconazole 2 % cream Commonly  known as: NIZORAL Apply 1 Application topically daily as needed for irritation.   methocarbamol  500 MG tablet Commonly known as: ROBAXIN  Take 500 mg by mouth every 8 (eight) hours as needed for muscle spasms.   nitroGLYCERIN  0.4 MG SL tablet Commonly known as: Nitrostat  Place 1 tablet (0.4 mg total) under the tongue every 5 (five) minutes as needed for chest pain.   olmesartan 20 MG tablet Commonly known as: BENICAR Take 20 mg by mouth daily.   rosuvastatin  20 MG tablet Commonly known as: CRESTOR  Take 2 tablets (40 mg total) by mouth daily. What changed: how much to take   Vitamin D 50 MCG (2000 UT) Caps Take 2,000 Units by mouth daily.           Allergies No Known Allergies  Outstanding Labs/Studies    Duration of Discharge Encounter   Greater than 30 minutes including physician time.  Signed, Thom LITTIE Sluder, PA-C 12/04/2023, 1:42 PM

## 2023-12-05 ENCOUNTER — Telehealth (HOSPITAL_COMMUNITY): Payer: Self-pay | Admitting: *Deleted

## 2023-12-05 ENCOUNTER — Ambulatory Visit: Admitting: Physician Assistant

## 2023-12-05 NOTE — Telephone Encounter (Signed)
 Received referral from Dr Pietro for this pt to participate in Cardiac rehab phase II s/p 12/04/23 DES to MLAD.  Called and spoke to pt who is very interested and excited to participate in CR.  Advised pt will be contacted after the successful completion of his follow up appt on 9/16.  Adrian [name] PEAK, BSN Cardiac and Emergency planning/management officer

## 2023-12-06 ENCOUNTER — Other Ambulatory Visit

## 2023-12-06 MED FILL — Famotidine in NaCl 0.9% IV Soln 20 MG/50ML: INTRAVENOUS | Qty: 50 | Status: AC

## 2023-12-12 ENCOUNTER — Emergency Department (HOSPITAL_COMMUNITY)
Admission: EM | Admit: 2023-12-12 | Discharge: 2023-12-12 | Disposition: A | Discharge status: Home / Self Care | Attending: Emergency Medicine | Admitting: Emergency Medicine

## 2023-12-12 ENCOUNTER — Encounter (HOSPITAL_COMMUNITY): Payer: Self-pay

## 2023-12-12 ENCOUNTER — Emergency Department (HOSPITAL_COMMUNITY)

## 2023-12-12 ENCOUNTER — Other Ambulatory Visit: Payer: Self-pay

## 2023-12-12 DIAGNOSIS — I10 Essential (primary) hypertension: Secondary | ICD-10-CM

## 2023-12-12 DIAGNOSIS — Z79899 Other long term (current) drug therapy: Secondary | ICD-10-CM | POA: Diagnosis not present

## 2023-12-12 DIAGNOSIS — R079 Chest pain, unspecified: Secondary | ICD-10-CM

## 2023-12-12 DIAGNOSIS — Z7982 Long term (current) use of aspirin: Secondary | ICD-10-CM | POA: Diagnosis not present

## 2023-12-12 DIAGNOSIS — Z7901 Long term (current) use of anticoagulants: Secondary | ICD-10-CM | POA: Insufficient documentation

## 2023-12-12 DIAGNOSIS — R0789 Other chest pain: Secondary | ICD-10-CM | POA: Insufficient documentation

## 2023-12-12 DIAGNOSIS — E785 Hyperlipidemia, unspecified: Secondary | ICD-10-CM | POA: Diagnosis not present

## 2023-12-12 DIAGNOSIS — I2510 Atherosclerotic heart disease of native coronary artery without angina pectoris: Secondary | ICD-10-CM

## 2023-12-12 DIAGNOSIS — I771 Stricture of artery: Secondary | ICD-10-CM | POA: Diagnosis not present

## 2023-12-12 LAB — CBC
HCT: 43.9 % (ref 39.0–52.0)
Hemoglobin: 14.5 g/dL (ref 13.0–17.0)
MCH: 28 pg (ref 26.0–34.0)
MCHC: 33 g/dL (ref 30.0–36.0)
MCV: 84.7 fL (ref 80.0–100.0)
Platelets: 251 K/uL (ref 150–400)
RBC: 5.18 MIL/uL (ref 4.22–5.81)
RDW: 13.5 % (ref 11.5–15.5)
WBC: 7.6 K/uL (ref 4.0–10.5)
nRBC: 0 % (ref 0.0–0.2)

## 2023-12-12 LAB — COMPREHENSIVE METABOLIC PANEL WITH GFR
ALT: 20 U/L (ref 0–44)
AST: 26 U/L (ref 15–41)
Albumin: 3.8 g/dL (ref 3.5–5.0)
Alkaline Phosphatase: 54 U/L (ref 38–126)
Anion gap: 6 (ref 5–15)
BUN: 15 mg/dL (ref 8–23)
CO2: 23 mmol/L (ref 22–32)
Calcium: 9.1 mg/dL (ref 8.9–10.3)
Chloride: 108 mmol/L (ref 98–111)
Creatinine, Ser: 0.79 mg/dL (ref 0.61–1.24)
GFR, Estimated: >60 mL/min (ref >60)
Glucose, Bld: 117 mg/dL — ABNORMAL HIGH (ref 70–99)
Potassium: 4.6 mmol/L (ref 3.5–5.1)
Sodium: 137 mmol/L (ref 135–145)
Total Bilirubin: 1.6 mg/dL — ABNORMAL HIGH (ref 0.0–1.2)
Total Protein: 6.7 g/dL (ref 6.5–8.1)

## 2023-12-12 LAB — I-STAT CHEM 8, ED
BUN: 18 mg/dL (ref 8–23)
Calcium, Ion: 1.2 mmol/L (ref 1.15–1.40)
Chloride: 107 mmol/L (ref 98–111)
Creatinine, Ser: 0.8 mg/dL (ref 0.61–1.24)
Glucose, Bld: 117 mg/dL — ABNORMAL HIGH (ref 70–99)
HCT: 43 % (ref 39.0–52.0)
Hemoglobin: 14.6 g/dL (ref 13.0–17.0)
Potassium: 4.6 mmol/L (ref 3.5–5.1)
Sodium: 140 mmol/L (ref 135–145)
TCO2: 26 mmol/L (ref 22–32)

## 2023-12-12 LAB — TROPONIN I (HIGH SENSITIVITY)
Troponin I (High Sensitivity): 5 ng/L (ref <18)
Troponin I (High Sensitivity): 7 ng/L (ref <18)

## 2023-12-12 NOTE — Consult Note (Signed)
 Cardiology Consultation   Patient ID: Adrian [name] MRN: [id number]; DOB: [birth year]  Admit date: 12/12/2023 Date of Consult: 12/12/2023  PCP:  Loreli Elsie [name], MD   [facility] Providers Cardiologist:  Redell Shallow, MD       Patient Profile: Adrian [name] is a 73 y.o. male with a hx of CAD s/p PCI to LAD 12/04/2023, HTN, HLD who is being seen 12/12/2023 for the evaluation of chest pain at the request of Dr. Levander.  History of Present Illness: Mr. Adrian [name] is a 73 yo male with PMH noted above.  Calcium  score in March 2020 of 2190 which was 95th percentile.  Underwent exercise treadmill October 2020 which was notable for great functional capacity and no ST changes.  Seen in the office 11/2023 with Orren Fabry, PA and reported chest pain with exertion.  Underwent coronary CTA with CT FFR showing FFR of 0.68 and mid LAD with recommendations for outpatient cardiac catheterization.  Underwent outpatient cardiac catheterization on 9/2 with severe heavily calcified mid LAD stenosis treated with intracoronary lithotripsy and DES x 1.  Did have mild nonflow-limiting stenosis of the proximal/mid circumflex and moderate calcific 9 flow-limiting stenosis of the mid RCA to be treated medically.  Was placed on DAPT with aspirin /Plavix  with recommendations for at least 6 months.  Presented to the ED on 9/10 with recurrent chest pain.  States this morning he got up and was in his usual state of health.  He was doing laundry and was pulling a large load of king-size sheets out of the washing machine, estimates about 40 pounds in weight whenever he experienced some centralized chest pain.  Afterwards took the dogs for a walk.  Did experience some centralized chest pain.  Was able to down take a nap and woke up with centralized chest pain with some radiation over over into his right armpit.  States he also has degenerative disc disease and has a lot of upper back pain that typically radiates into his chest as  well.  Sometimes this is difficult for him to differentiate what is musculoskeletal versus anginal.  Take 1 sublingual nitroglycerin  at home prior to EMS arrival with improvement.  Labs in the ED showed sodium 137, potassium 4.6, creatinine 0.79, high-sensitivity troponin negative x 1, WBC 7.6, hemoglobin 14.5.  EKG showed sinus rhythm, 85 bpm with no acute ST/T wave abnormalities.  Currently pain-free in the ED.  Past Medical History:  Diagnosis Date   Arthritis    Atypical nevus 04/20/2004   left low back (wider shave)   Basal cell carcinoma 01/16/1995   left back (cx3 53fu exc)   BCC (basal cell carcinoma of skin) 10/13/2002   left supra brow (cx3 fu exc)   BCC (basal cell carcinoma of skin) 04/20/2004   center upper back (cx3 1fu)   BCC (basal cell carcinoma of skin) 04/24/2006   upper mid back (cx3 32fu)   BCC (basal cell carcinoma of skin) 10/28/2012   left upper back (cx3 47fu)   BCC (basal cell carcinoma of skin) 10/28/2012   right back upper (cx3 41fu)   BCC (basal cell carcinoma of skin) 10/28/2012   right deltoid (cx3 53fu)   BCC (basal cell carcinoma of skin) 10/25/2015   right chest no treatment basaliod neoplasm   DJD (degenerative joint disease)    GERD (gastroesophageal reflux disease)    Hydrocele    Hypertension    Right hydrocele 2021   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  left temple (cx3 68fu)   Seasonal allergies     Past Surgical History:  Procedure Laterality Date   APPENDECTOMY     hemorrhaged post op   BACK SURGERY     CERVICAL DISC SURGERY  04/03/1993   CORONARY LITHOTRIPSY N/A 12/04/2023   Procedure: CORONARY LITHOTRIPSY;  Surgeon: Verlin Lonni BIRCH, MD;  Location: [facility];  Service: Cardiovascular;  Laterality: N/A;   CORONARY PRESSURE/FFR WITH 3D MAPPING N/A 12/04/2023   Procedure: Coronary Pressure/FFR w/3D Mapping;  Surgeon: Verlin Lonni BIRCH, MD;  Location: [facility];  Service: Cardiovascular;   Laterality: N/A;   CORONARY STENT INTERVENTION N/A 12/04/2023   Procedure: CORONARY STENT INTERVENTION;  Surgeon: Verlin Lonni BIRCH, MD;  Location: [facility];  Service: Cardiovascular;  Laterality: N/A;   DIAGNOSTIC LAPAROSCOPY     HERNIA REPAIR     umbilical   HYDROCELE EXCISION Right 07/14/2019   Procedure: HYDROCELECTOMY ADULT;  Surgeon: Ottelin, Mark, MD;  Location: [facility];  Service: Urology;  Laterality: Right;   KNEE ARTHROSCOPY  04/03/2008   lt   LEFT HEART CATH AND CORONARY ANGIOGRAPHY N/A 12/04/2023   Procedure: LEFT HEART CATH AND CORONARY ANGIOGRAPHY;  Surgeon: Verlin Lonni BIRCH, MD;  Location: [facility];  Service: Cardiovascular;  Laterality: N/A;   LUMBAR LAMINECTOMY  04/03/2006   SEPTOPLASTY     TONSILLECTOMY Bilateral 1970   TOTAL HIP ARTHROPLASTY Right 11/15/2020   Procedure: RIGHT TOTAL HIP ARTHROPLASTY ANTERIOR APPROACH;  Surgeon: Jerri Kay HERO, MD;  Location: [facility];  Service: Orthopedics;  Laterality: Right;  3-C    Scheduled Meds:  Continuous Infusions:  PRN Meds:   Allergies:   No Known Allergies  Social History:   Social History   Socioeconomic History   Marital status: Married    Spouse name: Not on file   Number of children: Not on file   Years of education: Not on file   Highest education level: Not on file  Occupational History   Not on file  Tobacco Use   Smoking status: Former    Current packs/day: 0.00    Types: Cigarettes    Start date: 04/03/1972    Quit date: 04/03/1973    Years since quitting: 50.7   Smokeless tobacco: Never  Vaping Use   Vaping status: Never Used  Substance and Sexual Activity   Alcohol  use: Not Currently   Drug use: No   Sexual activity: Yes  Other Topics Concern   Not on file  Social History Narrative   Not on file   Social Drivers of Health   Financial Resource Strain: Not on file  Food Insecurity: Not on file  Transportation Needs: Not on file  Physical  Activity: Not on file  Stress: Not on file  Social Connections: Not on file  Intimate Partner Violence: Not on file    Family History:    Family History  Family history unknown: Yes     ROS:  Please see the history of present illness.   All other ROS reviewed and negative.     Physical Exam/Data: Vitals:   12/12/23 1430 12/12/23 1445 12/12/23 1500 12/12/23 1515  BP: 124/69 118/71 123/68 137/76  Pulse: 79 78 78 83  Resp: 10 12 13 18   Temp:      TempSrc:      SpO2: 97% 94% 96% 99%  Weight:      Height:       No intake or output  data in the 24 hours ending 12/12/23 1605    12/12/2023   12:18 PM 12/04/2023    5:49 AM 11/19/2023    9:07 AM  Last 3 Weights  Weight (lbs) 195 lb 195 lb 200 lb  Weight (kg) 88.451 kg 88.451 kg 90.719 kg     Body mass index is 27.98 kg/m.  General:  Well nourished, well developed, in no acute distress HEENT: normal Neck: no JVD Vascular: No carotid bruits; Distal pulses 2+ bilaterally Cardiac:  normal S1, S2; RRR; no murmur  Lungs:  clear to auscultation bilaterally, no wheezing, rhonchi or rales  Abd: soft, nontender, no hepatomegaly  Ext: no edema Musculoskeletal:  No deformities, BUE and BLE strength normal and equal Skin: warm and dry  Neuro: no focal abnormalities noted Psych:  Normal affect   EKG:  The EKG was personally reviewed and demonstrates:  85 bpm with no acute ST/T wave abnormalities Telemetry:  Telemetry was personally reviewed and demonstrates:  Sinus Rhythm  Relevant CV Studies:  Cath: 12/04/2023    Prox RCA lesion is 50% stenosed.   Ost RCA to Prox RCA lesion is 20% stenosed.   RPDA lesion is 50% stenosed.   Mid Cx lesion is 40% stenosed.   Mid LAD lesion is 80% stenosed.   A drug-eluting stent was successfully placed using a STENT SYNERGY XD [id number].   Post intervention, there is a 0% residual stenosis.   The left ventricular systolic function is normal.   LV end diastolic pressure is mildly elevated.   The left  ventricular ejection fraction is 55-65% by visual estimate.   Severe, heavily calcified mid LAD stenosis.  Successful PTCA/intracoronary lithotripsy/DES x 1 mid LAD Mild non flow limiting stenosis in the proximal to mid Circumflex Moderate, calcific,  non flow limiting stenosis in the mid RCA.  LVEDP=16 mmHg   Recommendations: Continue DAPT with ASA and Plavix  for at least six months. Continue statin. Same day post PCI discharge.    Diagnostic Dominance: Right  Intervention    Laboratory Data: High Sensitivity Troponin:   Recent Labs  Lab 12/12/23 1250  TROPONINIHS 5     Chemistry Recent Labs  Lab 12/12/23 1250 12/12/23 1314  NA 137 140  K 4.6 4.6  CL 108 107  CO2 23  --   GLUCOSE 117* 117*  BUN 15 18  CREATININE 0.79 0.80  CALCIUM  9.1  --   GFRNONAA >60  --   ANIONGAP 6  --     Recent Labs  Lab 12/12/23 1250  PROT 6.7  ALBUMIN 3.8  AST 26  ALT 20  ALKPHOS 54  BILITOT 1.6*   Lipids No results for input(s): CHOL, TRIG, HDL, LABVLDL, LDLCALC, CHOLHDL in the last 168 hours.  Hematology Recent Labs  Lab 12/12/23 1250 12/12/23 1314  WBC 7.6  --   RBC 5.18  --   HGB 14.5 14.6  HCT 43.9 43.0  MCV 84.7  --   MCH 28.0  --   MCHC 33.0  --   RDW 13.5  --   PLT 251  --    Thyroid No results for input(s): TSH, FREET4 in the last 168 hours.  BNPNo results for input(s): BNP, PROBNP in the last 168 hours.  DDimer No results for input(s): DDIMER in the last 168 hours.  Radiology/Studies:  DG Chest Port 1 View Result Date: 12/12/2023 CLINICAL DATA:  chest pain EXAM: PORTABLE CHEST - 1 VIEW COMPARISON:  11/05/2020 FINDINGS: No focal airspace consolidation, pleural effusion,  or pneumothorax. No cardiomegaly. Tortuous aorta. No acute fracture or destructive lesions. Multilevel thoracic osteophytosis. IMPRESSION: No acute cardiopulmonary abnormality. Electronically Signed   By: Rogelia Myers M.D.   On: 12/12/2023 13:10     Assessment and  Plan:  JACORION KLEM is a 73 y.o. male with a hx of CAD s/p PCI to LAD 12/04/2023, HTN, HLD who is being seen 12/12/2023 for the evaluation of chest pain at the request of Dr. Levander.  Chest pain CAD s/p recent PCI 12/04/2023 -- Presented with chest pain after lifting heavy laundry today.  States symptoms are different than what he experienced with his previous angina.  EKG is nonischemic, initial high-sensitivity troponin negative.  He is currently pain-free in the ED.  Suspect this is more musculoskeletal related.  He has been compliant with his DAPT. -- If second troponin is negative then okay to discharge home, follow-up as been arranged.  HTN -- On Benicar -- BP elevated in the ED but reports it is normally well-controlled at home  HLD -- On Crestor  40 mg daily, recently increased.  Does report he has not tolerated this well.  May need to consider PCSK9 as an outpatient if continue to have trouble with high-dose statin   For questions or updates, please contact [facility] Please consult [email or web address] for contact info under    Signed, Manuelita Rummer, NP  12/12/2023 4:05 PM

## 2023-12-12 NOTE — ED Notes (Signed)
 Pt given discharge instructions and verbalized understanding. Able to get self dressed and ambulate to the front lobby to wait on his ride.

## 2023-12-12 NOTE — ED Provider Notes (Signed)
 Pt signed out by Dr. Levander pending cards consult and 2nd trop.  Cardiology did come and see patient and feel cp is msk in nature.  They are ok with d/c if 2nd trop normal, which it is.  Pt is happy to go home.  He knows to return if worse.  F/u with pcp/cards.   Dean Clarity, MD 12/12/23 [phone]

## 2023-12-12 NOTE — ED Triage Notes (Signed)
 Per EMS, Pt, from home, c/o central chest pain radiating into R side and R upper back/arm and SOB starting this morning.  Pt reports he was walking his dog when pain began.  Pt had cardiac stents placed on 9/2.  Pain score 1/10 since medications and rest.      324mg  Aspirin  and Nitroglycerin  x2

## 2023-12-12 NOTE — ED Notes (Signed)
 Cardiology at bedside.

## 2023-12-12 NOTE — ED Provider Notes (Addendum)
  EMERGENCY DEPARTMENT AT [facility] Provider Note   CSN: [id number] Arrival date & time: 12/12/23  1206     Patient presents with: Chest Pain and Post-op Problem   Adrian [name] is a 73 y.o. male.   HPI 73 year old male history of recent stent placement, disease, hypertension, hyperlipidemia, presents today complaining of chest pain.  Patient states he had stent placed 8 days ago and has not had any problems until today.  With walking the dog he began having some pain on the right side of the chest radiating under the right rib cage.  This is similar to the pain he had prior to stent placement.  He states it resolved after he rested.  However then returned at rest.  It resolved after 2 sublingual nitroglycerin .  He is not currently having any pain.  He had some mild dyspnea with that.    Prior to Admission medications   Medication Sig Start Date End Date Taking? Authorizing Provider  acetaminophen  (TYLENOL ) 500 MG tablet Take 500 mg by mouth every 6 (six) hours as needed for moderate pain.    [provider]  aspirin  EC 81 MG tablet Take 1 tablet (81 mg total) by mouth 2 (two) times daily. To be taken after surgery Patient taking differently: Take 81 mg by mouth daily. To be taken after surgery 11/08/20   Adrian Ronal CROME, PA-C  Cholecalciferol (VITAMIN D) 50 MCG (2000 UT) CAPS Take 2,000 Units by mouth daily.    [provider]  clopidogrel  (PLAVIX ) 75 MG tablet Take 1 tablet (75 mg total) by mouth daily. 12/04/23   Verlin Lonni BIRCH, MD  Coenzyme Q10 (COQ10) 100 MG CAPS Take 100 mg by mouth daily.    [provider]  fluticasone  (FLONASE ) 50 MCG/ACT nasal spray Place 2 sprays into both nostrils daily as needed for allergies.    [provider]  ketoconazole (NIZORAL) 2 % cream Apply 1 Application topically daily as needed for irritation.    [provider]  methocarbamol  (ROBAXIN ) 500 MG tablet Take 500 mg by mouth every  8 (eight) hours as needed for muscle spasms. 04/13/21   [provider]  nitroGLYCERIN  (NITROSTAT ) 0.4 MG SL tablet Place 1 tablet (0.4 mg total) under the tongue every 5 (five) minutes as needed for chest pain. 11/29/23 02/27/24  Adrian Orren SAILOR, PA-C  olmesartan (BENICAR) 20 MG tablet Take 20 mg by mouth daily.    [provider]  rosuvastatin  (CRESTOR ) 20 MG tablet Take 2 tablets (40 mg total) by mouth daily. Patient taking differently: Take 20 mg by mouth daily. 11/19/23   Adrian Orren SAILOR, PA-C    Allergies: Patient has no known allergies.    Review of Systems  Updated Vital Signs BP 123/73   Pulse 80   Temp 97.6 F (36.4 C) (Oral)   Resp 10   Ht 1.778 m (5' 10)   Wt 88.5 kg   SpO2 96%   BMI 27.98 kg/m   Physical Exam Vitals reviewed.  Constitutional:      Appearance: He is well-developed. He is obese.  HENT:     Head: Normocephalic and atraumatic.  Eyes:     Pupils: Pupils are equal, round, and reactive to light.  Cardiovascular:     Rate and Rhythm: Normal rate and regular rhythm.     Heart sounds: Normal heart sounds.  Pulmonary:     Effort: Pulmonary effort is normal.     Breath sounds: Normal  breath sounds.  Abdominal:     General: Bowel sounds are normal.     Palpations: Abdomen is soft.  Musculoskeletal:        General: Normal range of motion.     Cervical back: Normal range of motion and neck supple.     Right lower leg: No tenderness. No edema.     Left lower leg: No tenderness. No edema.  Skin:    General: Skin is warm and dry.     Capillary Refill: Capillary refill takes less than 2 seconds.  Neurological:     General: No focal deficit present.     Mental Status: He is alert.     (all labs ordered are listed, but only abnormal results are displayed) Labs Reviewed  COMPREHENSIVE METABOLIC PANEL WITH GFR - Abnormal; Notable for the following components:      Result Value   Glucose, Bld 117 (*)    Total Bilirubin 1.6 (*)    All  other components within normal limits  I-STAT CHEM 8, ED - Abnormal; Notable for the following components:   Glucose, Bld 117 (*)    All other components within normal limits  CBC  TROPONIN I (HIGH SENSITIVITY)  TROPONIN I (HIGH SENSITIVITY)    EKG: EKG Interpretation Date/Time:  Wednesday December 12 2023 12:18:27 EDT Ventricular Rate:  85 PR Interval:  168 QRS Duration:  105 QT Interval:  372 QTC Calculation: 443 R Axis:   47  Text Interpretation: Sinus rhythm RSR' in V1 or V2, right VCD or RVH Confirmed by Levander Houston [phone]) on 12/12/2023 12:28:07 PM  Radiology: [facility] Chest Port 1 View Result Date: 12/12/2023 CLINICAL DATA:  chest pain EXAM: PORTABLE CHEST - 1 VIEW COMPARISON:  11/05/2020 FINDINGS: No focal airspace consolidation, pleural effusion, or pneumothorax. No cardiomegaly. Tortuous aorta. No acute fracture or destructive lesions. Multilevel thoracic osteophytosis. IMPRESSION: No acute cardiopulmonary abnormality. Electronically Signed   By: Rogelia Myers M.D.   On: 12/12/2023 13:10     Procedures   Medications Ordered in the ED - No data to display                                  Medical Decision Making Amount and/or Complexity of Data Reviewed Labs: ordered. Radiology: ordered.  73 year old male with recent coronary stent presents today with new onset of chest pain.  Pain lasted total about 20 minutes with 2 episodes. EKG without acute ischemic changes. Patient is now pain-free. First troponin is normal. Second troponin is pending .  Care was discussed with [facility] cardiology and they will see in consultation and make recommendations. Disposition pending cardiology recommendations     Final diagnoses:  Chest pain, unspecified type    ED Discharge Orders     None          Levander Houston, MD 12/12/23 1447    Levander Houston, MD 12/12/23 1500

## 2023-12-17 NOTE — Progress Notes (Unsigned)
 Cardiology Office Note   Date:  12/18/2023  ID:  Adrian [name], DOB January 15, 1951, MRN [id number] PCP: Loreli Elsie [name], MD  [facility] Providers Cardiologist:  Redell Shallow, MD   History of Present Illness Adrian [name] is a 73 y.o. male with a past medical history of coronary artery disease here for follow-up appointment.  History includes calcium  score March in 2020 of 2190 which was 95th percentile.  Exercise treadmill October 2020 showed excellent functional capacity with no ST changes.  Since then, he has struggled with some dyspnea with more vigorous activities but not routine activities.  No orthopnea, PND, pedal edema or syncope.  Does not have any exertional chest pain but he does exercise routinely by walking 2 miles without any symptoms.  Occasionally feels pain in his back radiating to his chest if he sits at his computer wrong.  Has had intermittent pain for years.  No changes in this.  Today, he presents with a high calcium  score with chest pain.  Chest pain occurs during stress or physical exertion, located across the bottom of the rib cage and sometimes at the center of the sternum. The pain is associated with back muscle tension and is not continuous. There is no shortness of breath or sweating, but occasional lightheadedness occurs.  He takes Crestor  20 mg for cholesterol management, as a higher dose was intolerable. Recent lab work shows an LDL of 73, triglycerides of 45, and an A1c of 6.1. He maintains an active lifestyle with walking and swimming.  He recently experienced relief from stressful responsibilities, which he found burdensome.  Reports no shortness of breath nor dyspnea on exertion. No edema, orthopnea, PND. Reports no palpitations.   Discussed the use of AI scribe software for clinical note transcription with the patient, who gave verbal consent to proceed.   Today, he returned to discuss his cardiac cath and PCI. He has improved exercise  tolerance, now walking 3.6 miles daily and recently 6.2 miles with inclines. Post-procedure, he experienced chest pain and lightheadedness after exertion with household chores (unraveling some laundry that was twisted, wet, and heavy). He has a history of thoracic disc issues, which he believes contributed to the chest pain. Troponin levels were negative, and EKG showed normal sinus rhythm. No current chest pain is present, and previous morning chest pain has decreased significantly. He had a couple of purple spots post-procedure, which resolved without issue. Cath site without bruising and healing well.   Reports no shortness of breath nor dyspnea on exertion. Reports no chest pain, pressure, or tightness. No edema, orthopnea, PND. Reports no palpitations.   Discussed the use of AI scribe software for clinical note transcription with the patient, who gave verbal consent to proceed.  ROS: Pertinent ROS in HPI  Studies Reviewed  Cardiac cath 12/04/2023  Left Anterior Descending  Vessel is large.  Mid LAD lesion is 80% stenosed. The lesion is calcified. RFR 0.87    First Diagonal Branch  Vessel is small in size.    Second Diagonal Branch  Vessel is small in size.    Left Circumflex  Vessel is large.  Mid Cx lesion is 40% stenosed.    Right Coronary Artery  Vessel is large.  Ost RCA to Prox RCA lesion is 20% stenosed. The lesion is calcified.  Prox RCA lesion is 50% stenosed. The lesion is calcified.    Right Posterior Descending Artery  RPDA lesion is 50% stenosed.    Intervention   Mid  LAD lesion  Stent  [place] GUIDE 6FR XBLD 3.5 guide catheter was inserted. Lesion crossed with guidewire using a WIRE ASAHI PROWATER 180CM. Pre-stent angioplasty was performed using a BALLOON Swifton EMERGE MR 2.5X15. A drug-eluting stent was successfully placed using a STENT SYNERGY XD [id number]. Stent strut is well apposed. Post-stent angioplasty was performed using a BALLOON Dubach EUPHORA RX 3.0X12.   Post-Intervention Lesion Assessment  The intervention was successful. Pre-interventional TIMI flow is 3. Post-intervention TIMI flow is 3. No complications occurred at this lesion.  There is a 0% residual stenosis post intervention.     Wall Motion              Left Heart  Left Ventricle The left ventricular size is normal. The left ventricular systolic function is normal. LV end diastolic pressure is mildly elevated. The left ventricular ejection fraction is 55-65% by visual estimate. No regional wall motion abnormalities.   Coronary Diagrams  Diagnostic Dominance: Right  Intervention    Implants      CT calcium  score, 2020 TECHNIQUE: CT heart was performed using prospective ECG gating.   A non-contrast exam for calcium  scoring was performed.   Note that this exam targets the heart and the chest was not imaged in its entirety.   COMPARISON:  None.   FINDINGS: Technical quality: Image quality degraded by heart rate of 92 beats per minute, higher than optimal for the study.   CORONARY CALCIUM    Total Agatston Score: 2190 with calcifications most notable in the left anterior descending coronary artery and right coronary artery, also noted in the left circumflex coronary artery.   MESA database percentile:  95   OTHER FINDINGS:   Cardiovascular: Heart is normal size.  Aorta is normal caliber.   Mediastinum/Nodes: No adenopathy in the lower mediastinum or hila.   Lungs/Pleura: Visualized lungs clear.   Upper Abdomen: Imaging into the upper abdomen shows no acute findings.   Musculoskeletal: Chest wall soft tissues are unremarkable. No acute bony abnormality.   IMPRESSION: The observed calcium  score of 2190 is at the percentile 95 for subjects of the same age, gender and race/ethnicity who are free of clinical cardiovascular disease and treated diabetes.   No acute or significant extracardiac abnormality     Physical Exam VS:  BP 126/86   Pulse 84    Ht 5' 10 (1.778 m)   Wt 192 lb (87.1 kg)   SpO2 97%   BMI 27.55 kg/m        Wt Readings from Last 3 Encounters:  12/18/23 192 lb (87.1 kg)  12/12/23 195 lb (88.5 kg)  12/04/23 195 lb (88.5 kg)    GEN: Well nourished, well developed in no acute distress NECK: No JVD; faint R carotid bruit CARDIAC: RRR, no murmurs, rubs, gallops RESPIRATORY:  Clear to auscultation without rales, wheezing or rhonchi  ABDOMEN: Soft, non-tender, non-distended EXTREMITIES:  No edema; No deformity   ASSESSMENT AND PLAN  Coronary artery disease with recent PCI to LAD Post-intervention with improved exercise tolerance and reduced chest pain. Recent symptoms likely due to overexertion and thoracic nerve inflammation. Negative troponins and normal EKG confirmed no acute cardiac event. - Clear for cardiac rehabilitation for one month to monitor improvement and obtain metrics such as blood pressure, heart rate, oxygen saturation, and glucose. - Advise moderate physical activity, including walking and moderate lifting, avoiding heavy lifting.  Hyperlipidemia LDL at 73 mg/dL, close to target. Triglycerides and HDL satisfactory. Current Crestor  20 mg regimen effective without  symptoms. - Continue Crestor  20 mg daily. - Schedule follow-up lab work, ordered lipid panel today  Hypertension -well controlled today - Continue Benicar 20 mg daily  Bruit -right sided  -US  of carotids ordered today    Cardiac Rehabilitation Eligibility Assessment  The patient is ready to start cardiac rehabilitation from a cardiac standpoint.        Dispo: He can follow-up in 3 months with Dr. Pietro  Signed, [name] Fabry, PA-C

## 2023-12-18 ENCOUNTER — Other Ambulatory Visit

## 2023-12-18 ENCOUNTER — Ambulatory Visit: Attending: Physician Assistant | Admitting: Physician Assistant

## 2023-12-18 VITALS — BP 126/86 | HR 84 | Ht 70.0 in | Wt 192.0 lb

## 2023-12-18 DIAGNOSIS — I2583 Coronary atherosclerosis due to lipid rich plaque: Secondary | ICD-10-CM | POA: Diagnosis not present

## 2023-12-18 DIAGNOSIS — I10 Essential (primary) hypertension: Secondary | ICD-10-CM

## 2023-12-18 DIAGNOSIS — R0989 Other specified symptoms and signs involving the circulatory and respiratory systems: Secondary | ICD-10-CM

## 2023-12-18 DIAGNOSIS — E782 Mixed hyperlipidemia: Secondary | ICD-10-CM | POA: Diagnosis not present

## 2023-12-18 DIAGNOSIS — I2510 Atherosclerotic heart disease of native coronary artery without angina pectoris: Secondary | ICD-10-CM

## 2023-12-18 DIAGNOSIS — Z006 Encounter for examination for normal comparison and control in clinical research program: Secondary | ICD-10-CM

## 2023-12-18 NOTE — Patient Instructions (Addendum)
 Medication Instructions:   Your physician recommends that you continue on your current medications as directed. Please refer to the Current Medication list given to you today.   *If you need a refill on your cardiac medications before your next appointment, please call your pharmacy*   Lab Work:  PLEASE GO DOWN STAIRS  [facility]  FIRST FLOOR   ( GET OFF ELEVATORS WALK TOWARDS WAITING AREA LAB LOCATED BY PHARMACY):  RETURN WHEN FASTING FOR LIPID     If you have labs (blood work) drawn today and your tests are completely normal, you will receive your results only by: MyChart Message (if you have MyChart) OR A paper copy in the mail If you have any lab test that is abnormal or we need to change your treatment, we will call you to review the results.   Testing/Procedures: Your physician has requested that you have a carotid duplex. This test is an ultrasound of the carotid arteries in your neck. It looks at blood flow through these arteries that supply the brain with blood. Allow one hour for this exam. There are no restrictions or special instructions.     Follow-Up:  At [facility], you and your health needs are our priority.  As part of our continuing mission to provide you with exceptional heart care, our providers are all part of one team.  This team includes your primary Cardiologist (physician) and Advanced Practice Providers or APPs (Physician Assistants and Nurse Practitioners) who all work together to provide you with the care you need, when you need it.   Your next appointment:  CANCEL APPOINTMENT WITH CONTE  AND  3 month(s)  Provider:   Redell Shallow, MD      You have been referred to Cardiac Rehab ( Someone will contact you back from that department with further steps and appointment)       We recommend signing up for the patient portal called MyChart.  Sign up information is provided on this After Visit Summary.  MyChart is used to connect with patients for  Virtual Visits (Telemedicine).  Patients are able to view lab/test results, encounter notes, upcoming appointments, etc.  Non-urgent messages can be sent to your provider as well.   To learn more about what you can do with MyChart, go to [email or web address].   Other Instructions

## 2023-12-19 ENCOUNTER — Telehealth (HOSPITAL_COMMUNITY): Payer: Self-pay

## 2023-12-19 NOTE — Telephone Encounter (Signed)
 Attempted to call patient regarding cardiac rehab- no answer, left message. Sent MyChart message.

## 2023-12-19 NOTE — Telephone Encounter (Signed)
 Pt insurance is active and benefits verified through [facility]. Co-pay $20, DED $0/$0 met, out of pocket $4,000/$362.37 met, co-insurance 0%. No pre-authorization required. Passport, 12/19/2023 @ 9:15am, REF# [id number].  TCR/ICR? ICR Visit(date of service)limitation? No Can multiple codes be used on the same date of service/visit?(IF ITS A LIMIT) N/A  Is this a lifetime maximum or an annual maximum? Annual Has the member used any of these services to date? No Is there a time limit (weeks/months) on start of program and/or program completion? No

## 2023-12-19 NOTE — Telephone Encounter (Signed)
 Called patient to see if he was interested in participating in the Cardiac Rehab Program. Patient will come in for orientation on 9/19 and will attend the 8:15 exercise class.  Sent MyChart message.

## 2023-12-21 ENCOUNTER — Encounter (HOSPITAL_COMMUNITY)
Admission: RE | Admit: 2023-12-21 | Discharge: 2023-12-21 | Disposition: A | Discharge status: Home / Self Care | Source: Ambulatory Visit | Attending: Cardiology | Admitting: Cardiology

## 2023-12-21 VITALS — BP 116/68 | HR 77 | Ht 70.0 in | Wt 188.5 lb

## 2023-12-21 DIAGNOSIS — Z955 Presence of coronary angioplasty implant and graft: Secondary | ICD-10-CM | POA: Diagnosis not present

## 2023-12-21 DIAGNOSIS — I2583 Coronary atherosclerosis due to lipid rich plaque: Secondary | ICD-10-CM | POA: Diagnosis not present

## 2023-12-21 DIAGNOSIS — E782 Mixed hyperlipidemia: Secondary | ICD-10-CM | POA: Diagnosis not present

## 2023-12-21 DIAGNOSIS — I2510 Atherosclerotic heart disease of native coronary artery without angina pectoris: Secondary | ICD-10-CM | POA: Diagnosis not present

## 2023-12-21 LAB — LIPID PANEL
Chol/HDL Ratio: 2.1 ratio (ref 0.0–5.0)
Cholesterol, Total: 111 mg/dL (ref 100–199)
HDL: 52 mg/dL (ref >39)
LDL Chol Calc (NIH): 48 mg/dL (ref 0–99)
Triglycerides: 43 mg/dL (ref 0–149)
VLDL Cholesterol Cal: 11 mg/dL (ref 5–40)

## 2023-12-21 NOTE — Progress Notes (Signed)
 Cardiac Rehab Medication Review   Does the patient  feel that his/her medications are working for him/her? Yes    Has the patient been experiencing any side effects to the medications prescribed? Yes  Does the patient measure his/her own blood pressure or blood glucose at home?   Yes  Does the patient have any problems obtaining medications due to transportation or finances?   No  Understanding of regimen: good Understanding of indications: good Potential of compliance: good    Comments: Adrian [name] has a good understanding of his medications and regime. He checks his BP 5 days/week. He shared that sometimes he feels out of it since starting plavix  and rosuvastin 40mg .   Con KATHEE Pereyra, MS, ACSM-CEP 12/21/2023 11:03 AM

## 2023-12-21 NOTE — Progress Notes (Signed)
 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 To be taken after surgery (Patient taking differently: Take 81 mg by mouth daily. To be taken after surgery), Disp: 84 tablet, Rfl: 0   Cholecalciferol (VITAMIN D) 50 MCG (2000 UT) CAPS, Take 2,000 Units by mouth daily., Disp: , Rfl:    clopidogrel  (PLAVIX ) 75 MG tablet, Take 1 tablet (75 mg total) by mouth daily., Disp: 90 tablet, Rfl: 1   Coenzyme Q10 (COQ10) 100 MG CAPS, Take 100 mg by mouth daily., Disp: , Rfl:    fluticasone  (FLONASE ) 50 MCG/ACT nasal spray, Place 2 sprays into both nostrils daily as needed for allergies., Disp: , Rfl:    ketoconazole (NIZORAL) 2 % cream, Apply 1 Application topically daily as needed for irritation., Disp: , Rfl:    methocarbamol  (ROBAXIN ) 500 MG tablet, Take 500 mg by mouth every 8 (eight) hours as needed for muscle spasms., Disp: , Rfl:    nitroGLYCERIN  (NITROSTAT ) 0.4 MG SL tablet, Place 1 tablet (0.4 mg total) under the tongue every 5 (five) minutes as needed for chest pain., Disp: 25 tablet, Rfl: 3   olmesartan (BENICAR) 20 MG tablet, Take 20 mg by mouth daily., Disp: , Rfl:    rosuvastatin  (CRESTOR ) 20 MG tablet, Take 2 tablets (40 mg total) by mouth  daily., Disp: , Rfl:   Past Medical History: Past Medical History:  Diagnosis Date   Arthritis    Atypical nevus 04/20/2004   left low back (wider shave)   Basal cell carcinoma 01/16/1995   left back (cx3 89fu exc)   BCC (basal cell carcinoma of skin) 10/13/2002   left supra brow (cx3 fu exc)   BCC (basal cell carcinoma of skin) 04/20/2004   center upper back (cx3 23fu)   BCC (basal cell carcinoma of skin) 04/24/2006   upper mid back (cx3 64fu)   BCC (basal cell carcinoma of skin) 10/28/2012   left upper back (cx3 44fu)   BCC (basal cell carcinoma of skin) 10/28/2012   right back upper (cx3 48fu)   BCC (basal cell carcinoma of skin) 10/28/2012   right deltoid (cx3 9fu)   BCC (basal cell carcinoma of skin) 10/25/2015   right chest no treatment basaliod neoplasm   DJD (degenerative joint disease)    GERD (gastroesophageal reflux disease)    Hydrocele    Hypertension    Right hydrocele 2021   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   left temple (cx3 47fu)   Seasonal allergies     Tobacco Use: Social History   Tobacco Use  Smoking Status Former   Current packs/day: 0.00   Types: Cigarettes   Start date: 04/03/1972   Quit date: 04/03/1973   Years since quitting: 50.7  Smokeless Tobacco Never    Labs: Review Flowsheet       Latest Ref Rng & Units 05/18/2011 12/19/2019 12/12/2023  Labs for ITP Cardiac and Pulmonary Rehab  TCO2 22 - 32 mmol/L 19  25  26      Capillary Blood Glucose: Lab Results  Component Value Date   GLUCAP 100 (H) 12/19/2019     Exercise Target Goals: Exercise Program Goal: Individual exercise prescription set using results from initial 6 min walk test and THRR while considering  patient's activity barriers and safety.   Exercise Prescription Goal: Initial exercise prescription builds to 30-45 minutes a day of aerobic activity, 2-3 days per week.  Home exercise guidelines will be given to patient during program as part of exercise  prescription that the participant will acknowledge.  Activity Barriers & Risk Stratification:  Activity Barriers & Cardiac Risk Stratification - 12/21/23 1044       Activity Barriers & Cardiac Risk Stratification   Activity Barriers Arthritis;Joint Problems;Back Problems;Right Hip Replacement;Neck/Spine Problems;Balance Concerns    Cardiac Risk Stratification High   <5 METs on         6 Minute Walk:  6 Minute Walk     Row Name 12/21/23 1042         6 Minute Walk   Phase Initial     Distance 1000 feet     Walk Time 6 minutes     # of Rest Breaks 0     MPH 1.89     METS 2.53     RPE 9     Perceived Dyspnea  0     VO2 Peak 8.86     Symptoms Yes (comment)     Comments no pain. a little dizzy near end, pt stated felt like vertigo and was NPO for bloodwork after appt. resolved once seated pt , given H2O.     Resting HR 77 bpm     Resting BP 116/68     Resting Oxygen Saturation  98 %     Exercise Oxygen Saturation  during 6 min walk 98 %     Max Ex. HR 106 bpm     Max Ex. BP 154/64     2 Minute Post BP 138/66        Oxygen Initial Assessment:   Oxygen Re-Evaluation:   Oxygen Discharge (Final Oxygen Re-Evaluation):   Initial Exercise Prescription:  Initial Exercise Prescription - 12/21/23 1000       Date of Initial Exercise RX and Referring Provider   Date 12/21/23    Referring Provider Redell Shallow, MD    Expected Discharge Date 03/21/24      Recumbant Elliptical   Level 1    RPM 60    Watts 80    Minutes 15    METs 2.3      Track   Laps 12    Minutes 15    METs 2.3      Prescription Details   Frequency (times per week) 3    Duration Progress to 30 minutes of continuous aerobic without signs/symptoms of physical distress      Intensity   THRR 40-80% of Max Heartrate 58-117    Ratings of Perceived Exertion 11-13    Perceived Dyspnea 0-4      Progression   Progression Continue progressive overload as per policy without  signs/symptoms or physical distress.      Resistance Training   Training Prescription Yes    Weight 3  Reps 10-15          Perform Capillary Blood Glucose checks as needed.  Exercise Prescription Changes:   Exercise Comments:   Exercise Goals and Review:   Exercise Goals     Row Name 12/21/23 1049             Exercise Goals   Increase Physical Activity Yes       Intervention Provide advice, education, support and counseling about physical activity/exercise needs.;Develop an individualized exercise prescription for aerobic and resistive training based on initial evaluation findings, risk stratification, comorbidities and participant's personal goals.       Expected Outcomes Short Term: Attend rehab on a regular basis to increase amount of physical activity.;Long Term: Exercising regularly at least 3-5 days a week.;Long Term: Add in home exercise to make exercise part of routine and to increase amount of physical activity.       Increase Strength and Stamina Yes       Intervention Develop an individualized exercise prescription for aerobic and resistive training based on initial evaluation findings, risk stratification, comorbidities and participant's personal goals.;Provide advice, education, support and counseling about physical activity/exercise needs.       Expected Outcomes Long Term: Improve cardiorespiratory fitness, muscular endurance and strength as measured by increased METs and functional capacity ( );Short Term: Perform resistance training exercises routinely during rehab and add in resistance training at home;Short Term: Increase workloads from initial exercise prescription for resistance, speed, and METs.       Able to understand and use rate of perceived exertion (RPE) scale Yes       Intervention Provide education and explanation on how to use RPE scale       Expected Outcomes Short Term: Able to use RPE daily in rehab to express subjective intensity  level;Long Term:  Able to use RPE to guide intensity level when exercising independently       Knowledge and understanding of Target Heart Rate Range (THRR) Yes       Intervention Provide education and explanation of THRR including how the numbers were predicted and where they are located for reference       Expected Outcomes Long Term: Able to use THRR to govern intensity when exercising independently;Short Term: Able to use daily as guideline for intensity in rehab;Short Term: Able to state/look up THRR       Understanding of Exercise Prescription Yes       Intervention Provide education, explanation, and written materials on patient's individual exercise prescription       Expected Outcomes Short Term: Able to explain program exercise prescription;Long Term: Able to explain home exercise prescription to exercise independently          Exercise Goals Re-Evaluation :   Discharge Exercise Prescription (Final Exercise Prescription Changes):   Nutrition:  Target Goals: Understanding of nutrition guidelines, daily intake of sodium 1500mg , cholesterol 200mg , calories 30% from fat and 7% or less from saturated fats, daily to have 5 or more servings of fruits and vegetables.  Biometrics:  Pre Biometrics - 12/21/23 0922       Pre Biometrics   Waist Circumference 42.5 inches    Hip Circumference 41 inches    Waist to Hip Ratio 1.04 %    Triceps Skinfold 15 mm    % Body Fat 28.2 %    Grip Strength 23 kg    Flexibility --   not done- back issues   Single Leg Stand 0.2 seconds  Nutrition Therapy Plan and Nutrition Goals:   Nutrition Assessments:  MEDIFICTS Score Key: >=70 Need to make dietary changes  40-70 Heart Healthy Diet <= 40 Therapeutic Level Cholesterol Diet    Picture Your Plate Scores: <59 Unhealthy dietary pattern with much room for improvement. 41-50 Dietary pattern unlikely to meet recommendations for good health and room for improvement. 51-60 More  healthful dietary pattern, with some room for improvement.  >60 Healthy dietary pattern, although there may be some specific behaviors that could be improved.    Nutrition Goals Re-Evaluation:   Nutrition Goals Re-Evaluation:   Nutrition Goals Discharge (Final Nutrition Goals Re-Evaluation):   Psychosocial: Target Goals: Acknowledge presence or absence of significant depression and/or stress, maximize coping skills, provide positive support system. Participant is able to verbalize types and ability to use techniques and skills needed for reducing stress and depression.  Initial Review & Psychosocial Screening:  Initial Psych Review & Screening - 12/21/23 1050       Initial Review   Current issues with Current Stress Concerns    Source of Stress Concerns Family;Financial    Comments Draeden shared that he has some stress relating to financially supporting his son through a divorce along with other family issues. He denies any depression/anxiety and has a good outlook and positive mindset. Support offered, denies need for additional resources at this time.      Family Dynamics   Good Support System? Yes   wife     Barriers   Psychosocial barriers to participate in program The patient should benefit from training in stress management and relaxation.      Screening Interventions   Interventions Provide feedback about the scores to participant;Encouraged to exercise;To provide support and resources with identified psychosocial needs    Expected Outcomes Long Term goal: The participant improves quality of Life and PHQ9 Scores as seen by post scores and/or verbalization of changes;Short Term goal: Identification and review with participant of any Quality of Life or Depression concerns found by scoring the questionnaire.;Long Term Goal: Stressors or current issues are controlled or eliminated.          Quality of Life Scores:  Quality of Life - 12/21/23 1052       Quality of Life    Select Quality of Life      Quality of Life Scores   Health/Function Pre 21.68 %    Socioeconomic Pre 27.3 %    Psych/Spiritual Pre 24.92 %    Family Pre 23.7 %    GLOBAL Pre 23.6 %         Scores of 19 and below usually indicate a poorer quality of life in these areas.  A difference of  2-3 points is a clinically meaningful difference.  A difference of 2-3 points in the total score of the Quality of Life Index has been associated with significant improvement in overall quality of life, self-image, physical symptoms, and general health in studies assessing change in quality of life.  PHQ-9: Review Flowsheet       12/21/2023  Depression screen PHQ 2/9  Decreased Interest 0  Down, Depressed, Hopeless 1  PHQ - 2 Score 1  Altered sleeping 0  Tired, decreased energy 0  Change in appetite 1  Feeling bad or failure about yourself  0  Trouble concentrating 1  Moving slowly or fidgety/restless 0  Suicidal thoughts 0  PHQ-9 Score 3  Difficult doing work/chores Not difficult at all   Interpretation of Total Score  Total  Score Depression Severity:  1-4 = Minimal depression, 5-9 = Mild depression, 10-14 = Moderate depression, 15-19 = Moderately severe depression, 20-27 = Severe depression   Psychosocial Evaluation and Intervention:   Psychosocial Re-Evaluation:   Psychosocial Discharge (Final Psychosocial Re-Evaluation):   Vocational Rehabilitation: Provide vocational rehab assistance to qualifying candidates.   Vocational Rehab Evaluation & Intervention:  Vocational Rehab - 12/21/23 1055       Initial Vocational Rehab Evaluation & Intervention   Assessment shows need for Vocational Rehabilitation No   retired         Education: Education Goals: Education classes will be provided on a weekly basis, covering required topics. Participant will state understanding/return demonstration of topics presented.    Education     Row Name 12/21/23 0900     Education    Cardiac Education Topics Pritikin   Select Core Videos     Core Videos   Educator Dietitian   Select Nutrition   Nutrition Vitamins and Minerals   Instruction Review Code 1- Verbalizes Understanding   Class Start Time 0815   Class Stop Time 0855   Class Time Calculation (min) 40 min      Core Videos: Exercise    Move It!  Clinical staff conducted group or individual video education with verbal and written material and guidebook.  Patient learns the recommended Pritikin exercise program. Exercise with the goal of living a long, healthy life. Some of the health benefits of exercise include controlled diabetes, healthier blood pressure levels, improved cholesterol levels, improved heart and lung capacity, improved sleep, and better body composition. Everyone should speak with their doctor before starting or changing an exercise routine.  Biomechanical Limitations Clinical staff conducted group or individual video education with verbal and written material and guidebook.  Patient learns how biomechanical limitations can impact exercise and how we can mitigate and possibly overcome limitations to have an impactful and balanced exercise routine.  Body Composition Clinical staff conducted group or individual video education with verbal and written material and guidebook.  Patient learns that body composition (ratio of muscle mass to fat mass) is a key component to assessing overall fitness, rather than body weight alone. Increased fat mass, especially visceral belly fat, can put us  at increased risk for metabolic syndrome, type 2 diabetes, heart disease, and even death. It is recommended to combine diet and exercise (cardiovascular and resistance training) to improve your body composition. Seek guidance from your physician and exercise physiologist before implementing an exercise routine.  Exercise Action Plan Clinical staff conducted group or individual video education with verbal and written  material and guidebook.  Patient learns the recommended strategies to achieve and enjoy long-term exercise adherence, including variety, self-motivation, self-efficacy, and positive decision making. Benefits of exercise include fitness, good health, weight management, more energy, better sleep, less stress, and overall well-being.  Medical   Heart Disease Risk Reduction Clinical staff conducted group or individual video education with verbal and written material and guidebook.  Patient learns our heart is our most vital organ as it circulates oxygen, nutrients, white blood cells, and hormones throughout the entire body, and carries waste away. Data supports a plant-based eating plan like the Pritikin Program for its effectiveness in slowing progression of and reversing heart disease. The video provides a number of recommendations to address heart disease.   Metabolic Syndrome and Belly Fat  Clinical staff conducted group or individual video education with verbal and written material and guidebook.  Patient learns what metabolic syndrome is,  how it leads to heart disease, and how one can reverse it and keep it from coming back. You have metabolic syndrome if you have 3 of the following 5 criteria: abdominal obesity, high blood pressure, high triglycerides, low HDL cholesterol, and high blood sugar.  Hypertension and Heart Disease Clinical staff conducted group or individual video education with verbal and written material and guidebook.  Patient learns that high blood pressure, or hypertension, is very common in the [place] . Hypertension is largely due to excessive salt intake, but other important risk factors include being overweight, physical inactivity, drinking too much alcohol , smoking, and not eating enough potassium from fruits and vegetables. High blood pressure is a leading risk factor for heart attack, stroke, congestive heart failure, dementia, kidney failure, and premature death.  Long-term effects of excessive salt intake include stiffening of the arteries and thickening of heart muscle and organ damage. Recommendations include ways to reduce hypertension and the risk of heart disease.  Diseases of Our Time - Focusing on Diabetes Clinical staff conducted group or individual video education with verbal and written material and guidebook.  Patient learns why the best way to stop diseases of our time is prevention, through food and other lifestyle changes. Medicine (such as prescription pills and surgeries) is often only a Band-Aid on the problem, not a long-term solution. Most common diseases of our time include obesity, type 2 diabetes, hypertension, heart disease, and cancer. The Pritikin Program is recommended and has been proven to help reduce, reverse, and/or prevent the damaging effects of metabolic syndrome.  Nutrition   Overview of the Pritikin Eating Plan  Clinical staff conducted group or individual video education with verbal and written material and guidebook.  Patient learns about the Pritikin Eating Plan for disease risk reduction. The Pritikin Eating Plan emphasizes a wide variety of unrefined, minimally-processed carbohydrates, like fruits, vegetables, whole grains, and legumes. Go, Caution, and Stop food choices are explained. Plant-based and lean animal proteins are emphasized. Rationale provided for low sodium intake for blood pressure control, low added sugars for blood sugar stabilization, and low added fats and oils for coronary artery disease risk reduction and weight management.  Calorie Density  Clinical staff conducted group or individual video education with verbal and written material and guidebook.  Patient learns about calorie density and how it impacts the Pritikin Eating Plan. Knowing the characteristics of the food you choose will help you decide whether those foods will lead to weight gain or weight loss, and whether you want to consume more or  less of them. Weight loss is usually a side effect of the Pritikin Eating Plan because of its focus on low calorie-dense foods.  Label Reading  Clinical staff conducted group or individual video education with verbal and written material and guidebook.  Patient learns about the Pritikin recommended label reading guidelines and corresponding recommendations regarding calorie density, added sugars, sodium content, and whole grains.  Dining Out - Part 1  Clinical staff conducted group or individual video education with verbal and written material and guidebook.  Patient learns that restaurant meals can be sabotaging because they can be so high in calories, fat, sodium, and/or sugar. Patient learns recommended strategies on how to positively address this and avoid unhealthy pitfalls.  Facts on Fats  Clinical staff conducted group or individual video education with verbal and written material and guidebook.  Patient learns that lifestyle modifications can be just as effective, if not more so, as many medications for lowering your risk  of heart disease. A Pritikin lifestyle can help to reduce your risk of inflammation and atherosclerosis (cholesterol build-up, or plaque, in the artery walls). Lifestyle interventions such as dietary choices and physical activity address the cause of atherosclerosis. A review of the types of fats and their impact on blood cholesterol levels, along with dietary recommendations to reduce fat intake is also included.  Nutrition Action Plan  Clinical staff conducted group or individual video education with verbal and written material and guidebook.  Patient learns how to incorporate Pritikin recommendations into their lifestyle. Recommendations include planning and keeping personal health goals in mind as an important part of their success.  Healthy Mind-Set    Healthy Minds, Bodies, Hearts  Clinical staff conducted group or individual video education with verbal and written  material and guidebook.  Patient learns how to identify when they are stressed. Video will discuss the impact of that stress, as well as the many benefits of stress management. Patient will also be introduced to stress management techniques. The way we think, act, and feel has an impact on our hearts.  How Our Thoughts Can Heal Our Hearts  Clinical staff conducted group or individual video education with verbal and written material and guidebook.  Patient learns that negative thoughts can cause depression and anxiety. This can result in negative lifestyle behavior and serious health problems. Cognitive behavioral therapy is an effective method to help control our thoughts in order to change and improve our emotional outlook.  Additional Videos:  Exercise    Improving Performance  Clinical staff conducted group or individual video education with verbal and written material and guidebook.  Patient learns to use a non-linear approach by alternating intensity levels and lengths of time spent exercising to help burn more calories and lose more body fat. Cardiovascular exercise helps improve heart health, metabolism, hormonal balance, blood sugar control, and recovery from fatigue. Resistance training improves strength, endurance, balance, coordination, reaction time, metabolism, and muscle mass. Flexibility exercise improves circulation, posture, and balance. Seek guidance from your physician and exercise physiologist before implementing an exercise routine and learn your capabilities and proper form for all exercise.  Introduction to Yoga  Clinical staff conducted group or individual video education with verbal and written material and guidebook.  Patient learns about yoga, a discipline of the coming together of mind, breath, and body. The benefits of yoga include improved flexibility, improved range of motion, better posture and core strength, increased lung function, weight loss, and positive  self-image. Yoga's heart health benefits include lowered blood pressure, healthier heart rate, decreased cholesterol and triglyceride levels, improved immune function, and reduced stress. Seek guidance from your physician and exercise physiologist before implementing an exercise routine and learn your capabilities and proper form for all exercise.  Medical   Aging: Enhancing Your Quality of Life  Clinical staff conducted group or individual video education with verbal and written material and guidebook.  Patient learns key strategies and recommendations to stay in good physical health and enhance quality of life, such as prevention strategies, having an advocate, securing a Health Care Proxy and Power of Attorney, and keeping a list of medications and system for tracking them. It also discusses how to avoid risk for bone loss.  Biology of Weight Control  Clinical staff conducted group or individual video education with verbal and written material and guidebook.  Patient learns that weight gain occurs because we consume more calories than we burn (eating more, moving less). Even if your body weight is  normal, you may have higher ratios of fat compared to muscle mass. Too much body fat puts you at increased risk for cardiovascular disease, heart attack, stroke, type 2 diabetes, and obesity-related cancers. In addition to exercise, following the Pritikin Eating Plan can help reduce your risk.  Decoding Lab Results  Clinical staff conducted group or individual video education with verbal and written material and guidebook.  Patient learns that lab test reflects one measurement whose values change over time and are influenced by many factors, including medication, stress, sleep, exercise, food, hydration, pre-existing medical conditions, and more. It is recommended to use the knowledge from this video to become more involved with your lab results and evaluate your numbers to speak with your  doctor.   Diseases of Our Time - Overview  Clinical staff conducted group or individual video education with verbal and written material and guidebook.  Patient learns that according to the CDC, 50% to 70% of chronic diseases (such as obesity, type 2 diabetes, elevated lipids, hypertension, and heart disease) are avoidable through lifestyle improvements including healthier food choices, listening to satiety cues, and increased physical activity.  Sleep Disorders Clinical staff conducted group or individual video education with verbal and written material and guidebook.  Patient learns how good quality and duration of sleep are important to overall health and well-being. Patient also learns about sleep disorders and how they impact health along with recommendations to address them, including discussing with a physician.  Nutrition  Dining Out - Part 2 Clinical staff conducted group or individual video education with verbal and written material and guidebook.  Patient learns how to plan ahead and communicate in order to maximize their dining experience in a healthy and nutritious manner. Included are recommended food choices based on the type of restaurant the patient is visiting.   Fueling a Banker conducted group or individual video education with verbal and written material and guidebook.  There is a strong connection between our food choices and our health. Diseases like obesity and type 2 diabetes are very prevalent and are in large-part due to lifestyle choices. The Pritikin Eating Plan provides plenty of food and hunger-curbing satisfaction. It is easy to follow, affordable, and helps reduce health risks.  Menu Workshop  Clinical staff conducted group or individual video education with verbal and written material and guidebook.  Patient learns that restaurant meals can sabotage health goals because they are often packed with calories, fat, sodium, and sugar.  Recommendations include strategies to plan ahead and to communicate with the manager, chef, or server to help order a healthier meal.  Planning Your Eating Strategy  Clinical staff conducted group or individual video education with verbal and written material and guidebook.  Patient learns about the Pritikin Eating Plan and its benefit of reducing the risk of disease. The Pritikin Eating Plan does not focus on calories. Instead, it emphasizes high-quality, nutrient-rich foods. By knowing the characteristics of the foods, we choose, we can determine their calorie density and make informed decisions.  Targeting Your Nutrition Priorities  Clinical staff conducted group or individual video education with verbal and written material and guidebook.  Patient learns that lifestyle habits have a tremendous impact on disease risk and progression. This video provides eating and physical activity recommendations based on your personal health goals, such as reducing LDL cholesterol, losing weight, preventing or controlling type 2 diabetes, and reducing high blood pressure.  Vitamins and Minerals  Clinical staff conducted group or individual video  education with verbal and written material and guidebook.  Patient learns different ways to obtain key vitamins and minerals, including through a recommended healthy diet. It is important to discuss all supplements you take with your doctor.   Healthy Mind-Set    Smoking Cessation  Clinical staff conducted group or individual video education with verbal and written material and guidebook.  Patient learns that cigarette smoking and tobacco addiction pose a serious health risk which affects millions of people. Stopping smoking will significantly reduce the risk of heart disease, lung disease, and many forms of cancer. Recommended strategies for quitting are covered, including working with your doctor to develop a successful plan.  Culinary   Becoming a Corporate investment banker conducted group or individual video education with verbal and written material and guidebook.  Patient learns that cooking at home can be healthy, cost-effective, quick, and puts them in control. Keys to cooking healthy recipes will include looking at your recipe, assessing your equipment needs, planning ahead, making it simple, choosing cost-effective seasonal ingredients, and limiting the use of added fats, salts, and sugars.  Cooking - Breakfast and Snacks  Clinical staff conducted group or individual video education with verbal and written material and guidebook.  Patient learns how important breakfast is to satiety and nutrition through the entire day. Recommendations include key foods to eat during breakfast to help stabilize blood sugar levels and to prevent overeating at meals later in the day. Planning ahead is also a key component.  Cooking - Educational psychologist conducted group or individual video education with verbal and written material and guidebook.  Patient learns eating strategies to improve overall health, including an approach to cook more at home. Recommendations include thinking of animal protein as a side on your plate rather than center stage and focusing instead on lower calorie dense options like vegetables, fruits, whole grains, and plant-based proteins, such as beans. Making sauces in large quantities to freeze for later and leaving the skin on your vegetables are also recommended to maximize your experience.  Cooking - Healthy Salads and Dressing Clinical staff conducted group or individual video education with verbal and written material and guidebook.  Patient learns that vegetables, fruits, whole grains, and legumes are the foundations of the Pritikin Eating Plan. Recommendations include how to incorporate each of these in flavorful and healthy salads, and how to create homemade salad dressings. Proper handling of ingredients is also covered.  Cooking - Soups and [employer] - Soups and Desserts Clinical staff conducted group or individual video education with verbal and written material and guidebook.  Patient learns that Pritikin soups and desserts make for easy, nutritious, and delicious snacks and meal components that are low in sodium, fat, sugar, and calorie density, while high in vitamins, minerals, and filling fiber. Recommendations include simple and healthy ideas for soups and desserts.   Overview     The Pritikin Solution Program Overview Clinical staff conducted group or individual video education with verbal and written material and guidebook.  Patient learns that the results of the Pritikin Program have been documented in more than 100 articles published in peer-reviewed journals, and the benefits include reducing risk factors for (and, in some cases, even reversing) high cholesterol, high blood pressure, type 2 diabetes, obesity, and more! An overview of the three key pillars of the Pritikin Program will be covered: eating well, doing regular exercise, and having a healthy mind-set.  WORKSHOPS  Exercise: Exercise Basics: Building  Your Action Plan Clinical staff led group instruction and group discussion with PowerPoint presentation and patient guidebook. To enhance the learning environment the use of posters, models and videos may be added. At the conclusion of this workshop, patients will comprehend the difference between physical activity and exercise, as well as the benefits of incorporating both, into their routine. Patients will understand the FITT (Frequency, Intensity, Time, and Type) principle and how to use it to build an exercise action plan. In addition, safety concerns and other considerations for exercise and cardiac rehab will be addressed by the presenter. The purpose of this lesson is to promote a comprehensive and effective weekly exercise routine in order to improve patients' overall level of  fitness.   Managing Heart Disease: Your Path to a Healthier Heart Clinical staff led group instruction and group discussion with PowerPoint presentation and patient guidebook. To enhance the learning environment the use of posters, models and videos may be added.At the conclusion of this workshop, patients will understand the anatomy and physiology of the heart. Additionally, they will understand how Pritikin's three pillars impact the risk factors, the progression, and the management of heart disease.  The purpose of this lesson is to provide a high-level overview of the heart, heart disease, and how the Pritikin lifestyle positively impacts risk factors.  Exercise Biomechanics Clinical staff led group instruction and group discussion with PowerPoint presentation and patient guidebook. To enhance the learning environment the use of posters, models and videos may be added. Patients will learn how the structural parts of their bodies function and how these functions impact their daily activities, movement, and exercise. Patients will learn how to promote a neutral spine, learn how to manage pain, and identify ways to improve their physical movement in order to promote healthy living. The purpose of this lesson is to expose patients to common physical limitations that impact physical activity. Participants will learn practical ways to adapt and manage aches and pains, and to minimize their effect on regular exercise. Patients will learn how to maintain good posture while sitting, walking, and lifting.  Balance Training and Fall Prevention  Clinical staff led group instruction and group discussion with PowerPoint presentation and patient guidebook. To enhance the learning environment the use of posters, models and videos may be added. At the conclusion of this workshop, patients will understand the importance of their sensorimotor skills (vision, proprioception, and the vestibular system)  in maintaining their ability to balance as they age. Patients will apply a variety of balancing exercises that are appropriate for their current level of function. Patients will understand the common causes for poor balance, possible solutions to these problems, and ways to modify their physical environment in order to minimize their fall risk. The purpose of this lesson is to teach patients about the importance of maintaining balance as they age and ways to minimize their risk of falling.  WORKSHOPS   Nutrition:  Fueling a Ship broker led group instruction and group discussion with PowerPoint presentation and patient guidebook. To enhance the learning environment the use of posters, models and videos may be added. Patients will review the foundational principles of the Pritikin Eating Plan and understand what constitutes a serving size in each of the food groups. Patients will also learn Pritikin-friendly foods that are better choices when away from home and review make-ahead meal and snack options. Calorie density will be reviewed and applied to three nutrition priorities: weight maintenance, weight loss, and weight gain. The purpose of  this lesson is to reinforce (in a group setting) the key concepts around what patients are recommended to eat and how to apply these guidelines when away from home by planning and selecting Pritikin-friendly options. Patients will understand how calorie density may be adjusted for different weight management goals.  Mindful Eating  Clinical staff led group instruction and group discussion with PowerPoint presentation and patient guidebook. To enhance the learning environment the use of posters, models and videos may be added. Patients will briefly review the concepts of the Pritikin Eating Plan and the importance of low-calorie dense foods. The concept of mindful eating will be introduced as well as the importance of paying attention to internal hunger  signals. Triggers for non-hunger eating and techniques for dealing with triggers will be explored. The purpose of this lesson is to provide patients with the opportunity to review the basic principles of the Pritikin Eating Plan, discuss the value of eating mindfully and how to measure internal cues of hunger and fullness using the Hunger Scale. Patients will also discuss reasons for non-hunger eating and learn strategies to use for controlling emotional eating.  Targeting Your Nutrition Priorities Clinical staff led group instruction and group discussion with PowerPoint presentation and patient guidebook. To enhance the learning environment the use of posters, models and videos may be added. Patients will learn how to determine their genetic susceptibility to disease by reviewing their family history. Patients will gain insight into the importance of diet as part of an overall healthy lifestyle in mitigating the impact of genetics and other environmental insults. The purpose of this lesson is to provide patients with the opportunity to assess their personal nutrition priorities by looking at their family history, their own health history and current risk factors. Patients will also be able to discuss ways of prioritizing and modifying the Pritikin Eating Plan for their highest risk areas  Menu  Clinical staff led group instruction and group discussion with PowerPoint presentation and patient guidebook. To enhance the learning environment the use of posters, models and videos may be added. Using menus brought in from [employer], or printed from [employer], patients will apply the Pritikin dining out guidelines that were presented in the [employer] video. Patients will also be able to practice these guidelines in a variety of provided scenarios. The purpose of this lesson is to provide patients with the opportunity to practice hands-on learning of the Pritikin Dining Out guidelines  with actual menus and practice scenarios.  Label Reading Clinical staff led group instruction and group discussion with PowerPoint presentation and patient guidebook. To enhance the learning environment the use of posters, models and videos may be added. Patients will review and discuss the Pritikin label reading guidelines presented in Pritikin's Label Reading Educational series video. Using fool labels brought in from local grocery stores and markets, patients will apply the label reading guidelines and determine if the packaged food meet the Pritikin guidelines. The purpose of this lesson is to provide patients with the opportunity to review, discuss, and practice hands-on learning of the Pritikin Label Reading guidelines with actual packaged food labels. Cooking School  Pritikin's [employer] are designed to teach patients ways to prepare quick, simple, and affordable recipes at home. The importance of nutrition's role in chronic disease risk reduction is reflected in its emphasis in the overall Pritikin program. By learning how to prepare essential core Pritikin Eating Plan recipes, patients will increase control over what they eat; be able to  customize the flavor of foods without the use of added salt, sugar, or fat; and improve the quality of the food they consume. By learning a set of core recipes which are easily assembled, quickly prepared, and affordable, patients are more likely to prepare more healthy foods at home. These workshops focus on convenient breakfasts, simple entres, side dishes, and desserts which can be prepared with minimal effort and are consistent with nutrition recommendations for cardiovascular risk reduction. Cooking [employer] are taught by a Armed forces logistics/support/administrative officer (RD) who has been trained by the [employer]. The chef or RD has a clear understanding of the importance of minimizing - if not completely eliminating - added fat, sugar, and  sodium in recipes. Throughout the series of Cooking School Workshop sessions, patients will learn about healthy ingredients and efficient methods of cooking to build confidence in their capability to prepare    Cooking School weekly topics:  Adding Flavor- Sodium-Free  Fast and Healthy Breakfasts  Powerhouse Plant-Based Proteins  Satisfying Salads and Dressings  Simple Sides and Sauces  International Cuisine-Spotlight on the [employer] Zones  Delicious Desserts  Savory Soups  [employer] - Meals in a Astronomer Appetizers and Snacks  Comforting Weekend Breakfasts  One-Pot Wonders   Fast Evening Meals  Landscape architect Your Pritikin Plate  WORKSHOPS   Healthy Mindset (Psychosocial):  Focused Goals, Sustainable Changes Clinical staff led group instruction and group discussion with PowerPoint presentation and patient guidebook. To enhance the learning environment the use of posters, models and videos may be added. Patients will be able to apply effective goal setting strategies to establish at least one personal goal, and then take consistent, meaningful action toward that goal. They will learn to identify common barriers to achieving personal goals and develop strategies to overcome them. Patients will also gain an understanding of how our mind-set can impact our ability to achieve goals and the importance of cultivating a positive and growth-oriented mind-set. The purpose of this lesson is to provide patients with a deeper understanding of how to set and achieve personal goals, as well as the tools and strategies needed to overcome common obstacles which may arise along the way.  From Head to Heart: The Power of a Healthy Outlook  Clinical staff led group instruction and group discussion with PowerPoint presentation and patient guidebook. To enhance the learning environment the use of posters, models and videos may be added. Patients will be able to recognize and  describe the impact of emotions and mood on physical health. They will discover the importance of self-care and explore self-care practices which may work for them. Patients will also learn how to utilize the 4 C's to cultivate a healthier outlook and better manage stress and challenges. The purpose of this lesson is to demonstrate to patients how a healthy outlook is an essential part of maintaining good health, especially as they continue their cardiac rehab journey.  Healthy Sleep for a Healthy Heart Clinical staff led group instruction and group discussion with PowerPoint presentation and patient guidebook. To enhance the learning environment the use of posters, models and videos may be added. At the conclusion of this workshop, patients will be able to demonstrate knowledge of the importance of sleep to overall health, well-being, and quality of life. They will understand the symptoms of, and treatments for, common sleep disorders. Patients will also be able to identify daytime and nighttime behaviors which impact sleep, and they will be able to  apply these tools to help manage sleep-related challenges. The purpose of this lesson is to provide patients with a general overview of sleep and outline the importance of quality sleep. Patients will learn about a few of the most common sleep disorders. Patients will also be introduced to the concept of "sleep hygiene," and discover ways to self-manage certain sleeping problems through simple daily behavior changes. Finally, the workshop will motivate patients by clarifying the links between quality sleep and their goals of heart-healthy living.   Recognizing and Reducing Stress Clinical staff led group instruction and group discussion with PowerPoint presentation and patient guidebook. To enhance the learning environment the use of posters, models and videos may be added. At the conclusion of this workshop, patients will be able to understand the types of stress  reactions, differentiate between acute and chronic stress, and recognize the impact that chronic stress has on their health. They will also be able to apply different coping mechanisms, such as reframing negative self-talk. Patients will have the opportunity to practice a variety of stress management techniques, such as deep abdominal breathing, progressive muscle relaxation, and/or guided imagery.  The purpose of this lesson is to educate patients on the role of stress in their lives and to provide healthy techniques for coping with it.  Learning Barriers/Preferences:  Learning Barriers/Preferences - 12/21/23 1053       Learning Barriers/Preferences   Learning Barriers Sight   glasses   Learning Preferences Computer/Internet;Pictoral;Written Material   reading         Education Topics:  Knowledge Questionnaire Score:  Knowledge Questionnaire Score - 12/21/23 1053       Knowledge Questionnaire Score   Pre Score 22/24          Core Components/Risk Factors/Patient Goals at Admission:  Personal Goals and Risk Factors at Admission - 12/21/23 1055       Core Components/Risk Factors/Patient Goals on Admission    Weight Management Yes    Intervention Weight Management: Develop a combined nutrition and exercise program designed to reach desired caloric intake, while maintaining appropriate intake of nutrient and fiber, sodium and fats, and appropriate energy expenditure required for the weight goal.;Weight Management: Provide education and appropriate resources to help participant work on and attain dietary goals.    Expected Outcomes Short Term: Continue to assess and modify interventions until short term weight is achieved;Long Term: Adherence to nutrition and physical activity/exercise program aimed toward attainment of established weight goal;Understanding recommendations for meals to include 15-35% energy as protein, 25-35% energy from fat, 35-60% energy from carbohydrates, less than  200mg  of dietary cholesterol, 20-35 gm of total fiber daily;Understanding of distribution of calorie intake throughout the day with the consumption of 4-5 meals/snacks    Hypertension Yes    Intervention Provide education on lifestyle modifcations including regular physical activity/exercise, weight management, moderate sodium restriction and increased consumption of fresh fruit, vegetables, and low fat dairy, alcohol  moderation, and smoking cessation.;Monitor prescription use compliance.    Expected Outcomes Short Term: Continued assessment and intervention until BP is < 140/69mm HG in hypertensive participants. < 130/21mm HG in hypertensive participants with diabetes, heart failure or chronic kidney disease.;Long Term: Maintenance of blood pressure at goal levels.    Lipids Yes    Intervention Provide education and support for participant on nutrition & aerobic/resistive exercise along with prescribed medications to achieve LDL 70mg , HDL >40mg .    Expected Outcomes Long Term: Cholesterol controlled with medications as prescribed, with individualized exercise RX and with personalized  nutrition plan. Value goals: LDL < 70mg , HDL > 40 mg.;Short Term: Participant states understanding of desired cholesterol values and is compliant with medications prescribed. Participant is following exercise prescription and nutrition guidelines.    Stress Yes    Intervention Refer participants experiencing significant psychosocial distress to appropriate mental health specialists for further evaluation and treatment. When possible, include family members and significant others in education/counseling sessions.;Offer individual and/or small group education and counseling on adjustment to heart disease, stress management and health-related lifestyle change. Teach and support self-help strategies.    Expected Outcomes Long Term: Emotional wellbeing is indicated by absence of clinically significant psychosocial distress or  social isolation.;Short Term: Participant demonstrates changes in health-related behavior, relaxation and other stress management skills, ability to obtain effective social support, and compliance with psychotropic medications if prescribed.          Core Components/Risk Factors/Patient Goals Review:    Core Components/Risk Factors/Patient Goals at Discharge (Final Review):    ITP Comments:  ITP Comments     Row Name 12/21/23 0922           ITP Comments Dr. Wilbert Bihari medical director. Introduction to [facility] education/intensive cardiac rehab. Initial orientation packet reviewed with patient.          Comments: Participant attended orientation for the cardiac rehabilitation program on  12/21/2023  to perform initial intake and exercise walk test. Patient introduced to the [facility] education and orientation packet was reviewed. Completed 6-minute walk test, measurements, initial ITP, and exercise prescription. Vital signs stable. Telemetry-normal sinus rhythm, asymptomatic. Pt with slight dizziness near end of , stated it felt like vertigo. Resolved once seated. No other s/sx.    Service time was from 0759 to 1000.  Con KATHEE Pereyra, MS, ACSM-CEP 12/21/2023 10:57 AM

## 2023-12-25 ENCOUNTER — Ambulatory Visit: Payer: Self-pay | Admitting: Physician Assistant

## 2023-12-28 LAB — GENECONNECT MOLECULAR SCREEN: Genetic Analysis Overall Interpretation: NEGATIVE

## 2023-12-31 ENCOUNTER — Ambulatory Visit: Admitting: Physician Assistant

## 2024-01-01 ENCOUNTER — Ambulatory Visit (HOSPITAL_COMMUNITY)
Admission: RE | Admit: 2024-01-01 | Discharge: 2024-01-01 | Disposition: A | Discharge status: Home / Self Care | Source: Ambulatory Visit | Attending: Physician Assistant | Admitting: Physician Assistant

## 2024-01-01 DIAGNOSIS — R0989 Other specified symptoms and signs involving the circulatory and respiratory systems: Secondary | ICD-10-CM | POA: Diagnosis not present

## 2024-01-02 ENCOUNTER — Encounter (HOSPITAL_COMMUNITY)
Admission: RE | Admit: 2024-01-02 | Discharge: 2024-01-02 | Disposition: A | Discharge status: Home / Self Care | Source: Ambulatory Visit | Attending: Cardiology | Admitting: Cardiology

## 2024-01-02 DIAGNOSIS — Z955 Presence of coronary angioplasty implant and graft: Secondary | ICD-10-CM | POA: Diagnosis not present

## 2024-01-02 NOTE — Progress Notes (Signed)
 Daily Session Note  Patient Details  Name: Adrian [name] MRN: [id number] Date of Birth: December 06, 1950 Referring Provider:   Flowsheet Row INTENSIVE CARDIAC REHAB ORIENT from 12/21/2023 in [facility]  Referring Provider Redell Shallow, MD    Encounter Date: 01/02/2024  Check In:  Session Check In - 01/02/24 9162       Check-In   Supervising physician immediately available to respond to emergencies [facility] MD immediately available    Physician(s) Olivia Bane, NP    Location [facility]    Staff Present Alm Parkins, MS, ACSM-CEP, CCRP, Exercise Physiologist;Giovonnie Trettel Lennon, RN, BSN;Jetta Walker BS, ACSM-CEP, Exercise Physiologist;Olinty Valere, MS, ACSM-CEP, Exercise Physiologist    Virtual Visit No    Medication changes reported     No    Fall or balance concerns reported    No    Tobacco Cessation No Change    Warm-up and Cool-down Performed as group-led instruction   CRP2 orientation   Resistance Training Performed No    VAD Patient? No    PAD/SET Patient? No      Pain Assessment   Currently in Pain? No/denies    Pain Score 0-No pain    Multiple Pain Sites No          Capillary Blood Glucose: No results found for this or any previous visit (from the past 24 hours).   Exercise Prescription Changes - 01/02/24 1200       Response to Exercise   Blood Pressure (Admit) 138/60    Blood Pressure (Exercise) 164/60    Blood Pressure (Exit) 112/64    Heart Rate (Admit) 95 bpm    Heart Rate (Exercise) 139 bpm    Heart Rate (Exit) 104 bpm    Rating of Perceived Exertion (Exercise) 13    Symptoms None    Comments Pt's first day in the CRP2 program    Duration Continue with 30 min of aerobic exercise without signs/symptoms of physical distress.    Intensity THRR unchanged      Progression   Progression Continue to progress workloads to maintain intensity without signs/symptoms of physical distress.     Average METs 3.15      Resistance Training   Training Prescription No    Weight No wts on Wednesdyas      Interval Training   Interval Training No      Recumbant Elliptical   Level 1    RPM 62    Watts 80    Minutes 15    METs 3.4      Track   Laps 15    Minutes 15    METs 2.91          Social History   Tobacco Use  Smoking Status Former   Current packs/day: 0.00   Types: Cigarettes   Start date: 04/03/1972   Quit date: 04/03/1973   Years since quitting: 50.7  Smokeless Tobacco Never    Goals Met:  Exercise tolerated well No report of concerns or symptoms today  Goals Unmet:  Not Applicable  Comments: Pt started cardiac rehab today.  Pt tolerated light exercise without difficulty. VSS, telemetry-NSR, asymptomatic.  Medication list reconciled. Pt denies barriers to medication compliance.  PSYCHOSOCIAL ASSESSMENT:  PHQ-3. Pt exhibits positive coping skills, hopeful outlook with supportive family. No psychosocial needs identified at this time, no psychosocial interventions necessary.  Pt oriented to exercise equipment and routine.    Understanding verbalized.  Dr. Wilbert Bihari is Medical Director for Cardiac Rehab at [facility].

## 2024-01-04 ENCOUNTER — Encounter (HOSPITAL_COMMUNITY)
Admission: RE | Admit: 2024-01-04 | Discharge: 2024-01-04 | Disposition: A | Discharge status: Home / Self Care | Source: Ambulatory Visit | Attending: Cardiology | Admitting: Cardiology

## 2024-01-04 DIAGNOSIS — Z955 Presence of coronary angioplasty implant and graft: Secondary | ICD-10-CM

## 2024-01-04 NOTE — Progress Notes (Signed)
 Patient viewed in [place]     .SABRA Last read by [name] Devonshire at 2:06PM on 01/04/2024.

## 2024-01-07 ENCOUNTER — Encounter (HOSPITAL_COMMUNITY)
Admission: RE | Admit: 2024-01-07 | Discharge: 2024-01-07 | Disposition: A | Discharge status: Home / Self Care | Source: Ambulatory Visit | Attending: Cardiology | Admitting: Cardiology

## 2024-01-07 DIAGNOSIS — Z955 Presence of coronary angioplasty implant and graft: Secondary | ICD-10-CM | POA: Diagnosis not present

## 2024-01-09 ENCOUNTER — Encounter (HOSPITAL_COMMUNITY)
Admission: RE | Admit: 2024-01-09 | Discharge: 2024-01-09 | Disposition: A | Discharge status: Home / Self Care | Source: Ambulatory Visit | Attending: Cardiology | Admitting: Cardiology

## 2024-01-09 ENCOUNTER — Encounter (HOSPITAL_COMMUNITY)
Admission: RE | Admit: 2024-01-09 | Discharge: 2024-01-09 | Disposition: A | Source: Ambulatory Visit | Attending: Cardiology

## 2024-01-09 DIAGNOSIS — Z955 Presence of coronary angioplasty implant and graft: Secondary | ICD-10-CM

## 2024-01-11 ENCOUNTER — Encounter (HOSPITAL_COMMUNITY)
Admission: RE | Admit: 2024-01-11 | Discharge: 2024-01-11 | Disposition: A | Discharge status: Home / Self Care | Source: Ambulatory Visit | Attending: Cardiology | Admitting: Cardiology

## 2024-01-11 DIAGNOSIS — Z955 Presence of coronary angioplasty implant and graft: Secondary | ICD-10-CM | POA: Diagnosis not present

## 2024-01-11 NOTE — Progress Notes (Signed)
 Discussed PHQ9. Adrian [name] says he has had a lot going on. Adrian [name] says his son in the [employer] is going through a divorce. Adrian [name] says he had been overeating. Adrian [name] has made some dietary changes and is making healthier food choices. Adrian [name] says he is eating more vegetables and has changed to drinking skim milk.  Adrian [name] says he feels better since he has started cardiac rehab.Hadassah Elpidio Quan RN BSN

## 2024-01-14 ENCOUNTER — Encounter (HOSPITAL_COMMUNITY)
Admission: RE | Admit: 2024-01-14 | Discharge: 2024-01-14 | Disposition: A | Source: Ambulatory Visit | Attending: Cardiology

## 2024-01-14 DIAGNOSIS — Z955 Presence of coronary angioplasty implant and graft: Secondary | ICD-10-CM | POA: Diagnosis not present

## 2024-01-16 ENCOUNTER — Encounter (HOSPITAL_COMMUNITY)
Admission: RE | Admit: 2024-01-16 | Discharge: 2024-01-16 | Disposition: A | Source: Ambulatory Visit | Attending: Cardiology

## 2024-01-16 DIAGNOSIS — Z955 Presence of coronary angioplasty implant and graft: Secondary | ICD-10-CM

## 2024-01-18 ENCOUNTER — Encounter (HOSPITAL_COMMUNITY)
Admission: RE | Admit: 2024-01-18 | Discharge: 2024-01-18 | Disposition: A | Discharge status: Home / Self Care | Source: Ambulatory Visit | Attending: Cardiology | Admitting: Cardiology

## 2024-01-18 DIAGNOSIS — Z955 Presence of coronary angioplasty implant and graft: Secondary | ICD-10-CM | POA: Diagnosis not present

## 2024-01-21 ENCOUNTER — Encounter (HOSPITAL_COMMUNITY)
Admission: RE | Admit: 2024-01-21 | Discharge: 2024-01-21 | Disposition: A | Discharge status: Home / Self Care | Source: Ambulatory Visit | Attending: Cardiology | Admitting: Cardiology

## 2024-01-21 DIAGNOSIS — Z955 Presence of coronary angioplasty implant and graft: Secondary | ICD-10-CM

## 2024-01-23 ENCOUNTER — Encounter (HOSPITAL_COMMUNITY)
Admission: RE | Admit: 2024-01-23 | Discharge: 2024-01-23 | Disposition: A | Source: Ambulatory Visit | Attending: Cardiology

## 2024-01-23 DIAGNOSIS — Z955 Presence of coronary angioplasty implant and graft: Secondary | ICD-10-CM | POA: Diagnosis not present

## 2024-01-25 ENCOUNTER — Encounter (HOSPITAL_COMMUNITY)
Admission: RE | Admit: 2024-01-25 | Discharge: 2024-01-25 | Disposition: A | Discharge status: Home / Self Care | Source: Ambulatory Visit | Attending: Cardiology | Admitting: Cardiology

## 2024-01-25 DIAGNOSIS — Z955 Presence of coronary angioplasty implant and graft: Secondary | ICD-10-CM | POA: Diagnosis not present

## 2024-01-28 ENCOUNTER — Encounter (HOSPITAL_COMMUNITY)
Admission: RE | Admit: 2024-01-28 | Discharge: 2024-01-28 | Disposition: A | Source: Ambulatory Visit | Attending: Cardiology

## 2024-01-28 DIAGNOSIS — Z955 Presence of coronary angioplasty implant and graft: Secondary | ICD-10-CM

## 2024-01-28 NOTE — Progress Notes (Signed)
Reviewed home exercise Rx with patient today.  Encouraged warm-up, cool-down, and stretching. Reviewed THRR of  58 - 117 and keeping RPE between 11-13. Encouraged to hydrate with activity.  Reviewed weather parameters for temperature and humidity for safe exercise outdoors. Reviewed S/S to terminate exercise and when to call 911 vs MD. Reviewed the use of NTG and pt was encouraged to carry at all times. Pt encouraged to always carry a cell phone for safety when exercising outdoors. Pt verbalized understanding of the home exercise Rx and was provided a copy.    Lesly Rubenstein MS, ACSM-CEP, CCRP

## 2024-01-30 ENCOUNTER — Encounter (HOSPITAL_COMMUNITY)
Admission: RE | Admit: 2024-01-30 | Discharge: 2024-01-30 | Disposition: A | Discharge status: Home / Self Care | Source: Ambulatory Visit | Attending: Cardiology | Admitting: Cardiology

## 2024-01-30 DIAGNOSIS — Z955 Presence of coronary angioplasty implant and graft: Secondary | ICD-10-CM | POA: Diagnosis not present

## 2024-01-31 NOTE — Progress Notes (Signed)
 Cardiac Individual Treatment Plan  Patient Details  Name: Adrian [name] MRN: [id number] Date of Birth: 1951/01/10 Referring Provider:   Flowsheet Row INTENSIVE CARDIAC REHAB ORIENT from 12/21/2023 in [facility]  Referring Provider Redell Shallow, MD    Initial Encounter Date:  Flowsheet Row INTENSIVE CARDIAC REHAB ORIENT from 12/21/2023 in [facility]  Date 12/21/23    Visit Diagnosis: 12/04/23 DES LAD  Patient's Home Medications on Admission:  Current Outpatient Medications:    acetaminophen  (TYLENOL ) 500 MG tablet, Take 500 mg by mouth every 6 (six) hours as needed for moderate pain., Disp: , Rfl:    aspirin  EC 81 MG tablet, Take 1 tablet (81 mg total) by mouth 2 (two) times daily. To be taken after surgery (Patient taking differently: Take 81 mg by mouth daily. To be taken after surgery), Disp: 84 tablet, Rfl: 0   Cholecalciferol (VITAMIN D) 50 MCG (2000 UT) CAPS, Take 2,000 Units by mouth daily., Disp: , Rfl:    clopidogrel  (PLAVIX ) 75 MG tablet, Take 1 tablet (75 mg total) by mouth daily., Disp: 90 tablet, Rfl: 1   Coenzyme Q10 (COQ10) 100 MG CAPS, Take 100 mg by mouth daily., Disp: , Rfl:    fluticasone  (FLONASE ) 50 MCG/ACT nasal spray, Place 2 sprays into both nostrils daily as needed for allergies., Disp: , Rfl:    ketoconazole (NIZORAL) 2 % cream, Apply 1 Application topically daily as needed for irritation., Disp: , Rfl:    methocarbamol  (ROBAXIN ) 500 MG tablet, Take 500 mg by mouth every 8 (eight) hours as needed for muscle spasms., Disp: , Rfl:    nitroGLYCERIN  (NITROSTAT ) 0.4 MG SL tablet, Place 1 tablet (0.4 mg total) under the tongue every 5 (five) minutes as needed for chest pain., Disp: 25 tablet, Rfl: 3   olmesartan (BENICAR) 20 MG tablet, Take 20 mg by mouth daily., Disp: , Rfl:    rosuvastatin  (CRESTOR ) 20 MG tablet, Take 2 tablets (40 mg total) by mouth  daily., Disp: , Rfl:   Past Medical History: Past Medical History:  Diagnosis Date   Arthritis    Atypical nevus 04/20/2004   left low back (wider shave)   Basal cell carcinoma 01/16/1995   left back (cx3 73fu exc)   BCC (basal cell carcinoma of skin) 10/13/2002   left supra brow (cx3 fu exc)   BCC (basal cell carcinoma of skin) 04/20/2004   center upper back (cx3 56fu)   BCC (basal cell carcinoma of skin) 04/24/2006   upper mid back (cx3 44fu)   BCC (basal cell carcinoma of skin) 10/28/2012   left upper back (cx3 23fu)   BCC (basal cell carcinoma of skin) 10/28/2012   right back upper (cx3 41fu)   BCC (basal cell carcinoma of skin) 10/28/2012   right deltoid (cx3 57fu)   BCC (basal cell carcinoma of skin) 10/25/2015   right chest no treatment basaliod neoplasm   DJD (degenerative joint disease)    GERD (gastroesophageal reflux disease)    Hydrocele    Hypertension    Right hydrocele 2021   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   left temple (cx3 48fu)   Seasonal allergies     Tobacco Use: Social History   Tobacco Use  Smoking Status Former   Current packs/day: 0.00   Types: Cigarettes   Start date: 04/03/1972   Quit date: 04/03/1973   Years since quitting: 50.8  Smokeless Tobacco Never    Labs: Review Flowsheet       Latest Ref Rng & Units 05/18/2011 12/19/2019 12/12/2023 12/21/2023  Labs for ITP Cardiac and Pulmonary Rehab  Cholestrol 100 - 199 mg/dL - - - 888   LDL (calc) 0 - 99 mg/dL - - - 48   HDL-C >60 mg/dL - - - 52   Trlycerides 0 - 149 mg/dL - - - 43   TCO2 22 - 32 mmol/L 19  25  26   -    Capillary Blood Glucose: Lab Results  Component Value Date   GLUCAP 100 (H) 12/19/2019     Exercise Target Goals: Exercise Program Goal: Individual exercise prescription set using results from initial 6 min walk test and THRR while considering  patient's activity barriers and safety.   Exercise Prescription Goal: Initial exercise prescription builds to  30-45 minutes a day of aerobic activity, 2-3 days per week.  Home exercise guidelines will be given to patient during program as part of exercise prescription that the participant will acknowledge.  Activity Barriers & Risk Stratification:  Activity Barriers & Cardiac Risk Stratification - 12/21/23 1044       Activity Barriers & Cardiac Risk Stratification   Activity Barriers Arthritis;Joint Problems;Back Problems;Right Hip Replacement;Neck/Spine Problems;Balance Concerns    Cardiac Risk Stratification High   <5 METs on         6 Minute Walk:  6 Minute Walk     Row Name 12/21/23 1042         6 Minute Walk   Phase Initial     Distance 1000 feet     Walk Time 6 minutes     # of Rest Breaks 0     MPH 1.89     METS 2.53     RPE 9     Perceived Dyspnea  0     VO2 Peak 8.86     Symptoms Yes (comment)     Comments no pain. a little dizzy near end, pt stated felt like vertigo and was NPO for bloodwork after appt. resolved once seated pt , given H2O.     Resting HR 77 bpm     Resting BP 116/68     Resting Oxygen Saturation  98 %     Exercise Oxygen Saturation  during 6 min walk 98 %     Max Ex. HR 106 bpm     Max Ex. BP 154/64     2 Minute Post BP 138/66        Oxygen Initial Assessment:   Oxygen Re-Evaluation:   Oxygen Discharge (Final Oxygen Re-Evaluation):   Initial Exercise Prescription:  Initial Exercise Prescription - 12/21/23 1000       Date of Initial Exercise RX and Referring Provider   Date 12/21/23    Referring Provider Redell Shallow, MD    Expected Discharge Date 03/21/24      Recumbant Elliptical   Level 1    RPM 60    Watts 80    Minutes 15    METs 2.3      Track   Laps 12    Minutes 15    METs 2.3      Prescription Details   Frequency (times per week) 3    Duration Progress to 30 minutes of continuous aerobic without signs/symptoms of physical distress      Intensity   THRR 40-80% of Max Heartrate 58-117    Ratings of  Perceived Exertion  11-13    Perceived Dyspnea 0-4      Progression   Progression Continue progressive overload as per policy without signs/symptoms or physical distress.      Resistance Training   Training Prescription Yes    Weight 3    Reps 10-15          Perform Capillary Blood Glucose checks as needed.  Exercise Prescription Changes:   Exercise Prescription Changes     Row Name 01/02/24 1200 01/16/24 1100 01/25/24 1127 01/30/24 1015       Response to Exercise   Blood Pressure (Admit) 138/60 124/58 120/74 122/66    Blood Pressure (Exercise) 164/60 142/84 -- --    Blood Pressure (Exit) 112/64 96/58 110/62 104/56    Heart Rate (Admit) 95 bpm 76 bpm 77 bpm 87 bpm    Heart Rate (Exercise) 139 bpm 104 bpm 115 bpm 121 bpm    Heart Rate (Exit) 104 bpm 87 bpm 86 bpm 96 bpm    Rating of Perceived Exertion (Exercise) 13 13 11 11     Symptoms None None None None    Comments Pt's first day in the CRP2 program Reviewed METs Reviewed home exercise Rx Reviewed METs and goals    Duration Continue with 30 min of aerobic exercise without signs/symptoms of physical distress. Continue with 30 min of aerobic exercise without signs/symptoms of physical distress. Continue with 30 min of aerobic exercise without signs/symptoms of physical distress. Continue with 30 min of aerobic exercise without signs/symptoms of physical distress.    Intensity THRR unchanged THRR unchanged THRR unchanged THRR unchanged      Progression   Progression Continue to progress workloads to maintain intensity without signs/symptoms of physical distress. Continue to progress workloads to maintain intensity without signs/symptoms of physical distress. Continue to progress workloads to maintain intensity without signs/symptoms of physical distress. Continue to progress workloads to maintain intensity without signs/symptoms of physical distress.    Average METs 3.15 2.75 2.95 3.25      Resistance Training   Training  Prescription No No Yes No    Weight No wts on Wednesdyas No wts on Wednesdyas 5 lbs No wts on wednesdays    Reps -- -- 10-15 --    Time -- -- 5 Minutes --      Interval Training   Interval Training No No No No      NuStep   Level -- 3 5 5     SPM -- 96 88 92    Minutes -- 15 15 15     METs -- 2.5 2.9 3.6      Recumbant Elliptical   Level 1 -- -- --    RPM 62 -- -- --    Watts 80 -- -- --    Minutes 15 -- -- --    METs 3.4 -- -- --      Track   Laps 15 16 16 15     Minutes 15 15 15 15     METs 2.91 3.04 3.04 2.91      Home Exercise Plan   Plans to continue exercise at -- -- Home (comment) Home (comment)    Frequency -- -- Add 2 additional days to program exercise sessions. Add 2 additional days to program exercise sessions.    Initial Home Exercises Provided -- -- 01/25/24 01/25/24       Exercise Comments:   Exercise Comments     Row Name 01/02/24 1220 01/16/24 1121 01/25/24 1126 01/25/24 1129 01/30/24 1015  Exercise Comments Pt exercised without complaints. Pt did exceed THR on track and was told to slow pace. Will monitor and request increase in THRR if needed. Reviewed METs, Pt has imrpoved on METs up to last seeion with an average of 3.9 and peak of 4.3.  However due to significant back problems, pt voices he cannot tolerate the recumbent elliptical due to it causing him back pain the next day. Therefore, we have switched to Nustep today. Pt voices that it feels much bettter on his back. Pt will infom staff of his back pain after using the nustep. METs are noted to be lower with change to Nustep. Reviewed home exercise Rx today with patient. Pt has been walking at home, walkig his dogs and doing yard work. Pt has a elliptical and will start to use that again too. Pt will exercise 2x/week for 30 minutes. Pt verbalized understanding of the home exercise Rx and was provided a copy. Reviewed home exercise Rx with patient today. Pt will walk and use his elliptical at home. Pt will do  2-3 x/week for 30 minutes in addtion to stretching he already does. Pt verblaized understanding of the home exercise Rx and  was provided a copy. Reviewed METs and goals. Pt is making good progress. Voices feeling tired today due to poor sleep. Recently changed pt from Octance to Nustep as the Octance was exacerbating his chronic back pain. Pt voices the Nustep is much better for his back.      Exercise Goals and Review:   Exercise Goals     Row Name 12/21/23 1049             Exercise Goals   Increase Physical Activity Yes       Intervention Provide advice, education, support and counseling about physical activity/exercise needs.;Develop an individualized exercise prescription for aerobic and resistive training based on initial evaluation findings, risk stratification, comorbidities and participant's personal goals.       Expected Outcomes Short Term: Attend rehab on a regular basis to increase amount of physical activity.;Long Term: Exercising regularly at least 3-5 days a week.;Long Term: Add in home exercise to make exercise part of routine and to increase amount of physical activity.       Increase Strength and Stamina Yes       Intervention Develop an individualized exercise prescription for aerobic and resistive training based on initial evaluation findings, risk stratification, comorbidities and participant's personal goals.;Provide advice, education, support and counseling about physical activity/exercise needs.       Expected Outcomes Long Term: Improve cardiorespiratory fitness, muscular endurance and strength as measured by increased METs and functional capacity ( );Short Term: Perform resistance training exercises routinely during rehab and add in resistance training at home;Short Term: Increase workloads from initial exercise prescription for resistance, speed, and METs.       Able to understand and use rate of perceived exertion (RPE) scale Yes       Intervention Provide  education and explanation on how to use RPE scale       Expected Outcomes Short Term: Able to use RPE daily in rehab to express subjective intensity level;Long Term:  Able to use RPE to guide intensity level when exercising independently       Knowledge and understanding of Target Heart Rate Range (THRR) Yes       Intervention Provide education and explanation of THRR including how the numbers were predicted and where they are located for reference  Expected Outcomes Long Term: Able to use THRR to govern intensity when exercising independently;Short Term: Able to use daily as guideline for intensity in rehab;Short Term: Able to state/look up THRR       Understanding of Exercise Prescription Yes       Intervention Provide education, explanation, and written materials on patient's individual exercise prescription       Expected Outcomes Short Term: Able to explain program exercise prescription;Long Term: Able to explain home exercise prescription to exercise independently          Exercise Goals Re-Evaluation :  Exercise Goals Re-Evaluation     Row Name 01/02/24 1219 01/30/24 1015           Exercise Goal Re-Evaluation   Exercise Goals Review Increase Physical Activity;Understanding of Exercise Prescription;Increase Strength and Stamina;Knowledge and understanding of Target Heart Rate Range (THRR);Able to understand and use rate of perceived exertion (RPE) scale Increase Physical Activity;Understanding of Exercise Prescription;Increase Strength and Stamina;Knowledge and understanding of Target Heart Rate Range (THRR);Able to understand and use rate of perceived exertion (RPE) scale      Comments Pt's first day in the CRP2 program. Pt understands the exercise Rx, RPE scale and THRR. Reviewed METs and goals. Pt voices progress on his golas of increased strength and stamina. Pt voices he is back to doing many home taska which he was not doing before. Pt is also making progress on the goals of  knowning his limits, and improving his knowledge about nutrition.      Expected Outcomes Will continue to monitor the patient and progress exericse workloads as toelrated. Will continue to monitor the patient and progress exericse workloads as toelrated.         Discharge Exercise Prescription (Final Exercise Prescription Changes):  Exercise Prescription Changes - 01/30/24 1015       Response to Exercise   Blood Pressure (Admit) 122/66    Blood Pressure (Exit) 104/56    Heart Rate (Admit) 87 bpm    Heart Rate (Exercise) 121 bpm    Heart Rate (Exit) 96 bpm    Rating of Perceived Exertion (Exercise) 11    Symptoms None    Comments Reviewed METs and goals    Duration Continue with 30 min of aerobic exercise without signs/symptoms of physical distress.    Intensity THRR unchanged      Progression   Progression Continue to progress workloads to maintain intensity without signs/symptoms of physical distress.    Average METs 3.25      Resistance Training   Training Prescription No    Weight No wts on wednesdays      Interval Training   Interval Training No      NuStep   Level 5    SPM 92    Minutes 15    METs 3.6      Track   Laps 15    Minutes 15    METs 2.91      Home Exercise Plan   Plans to continue exercise at Home (comment)    Frequency Add 2 additional days to program exercise sessions.    Initial Home Exercises Provided 01/25/24          Nutrition:  Target Goals: Understanding of nutrition guidelines, daily intake of sodium 1500mg , cholesterol 200mg , calories 30% from fat and 7% or less from saturated fats, daily to have 5 or more servings of fruits and vegetables.  Biometrics:  Pre Biometrics - 12/21/23 9077  Pre Biometrics   Waist Circumference 42.5 inches    Hip Circumference 41 inches    Waist to Hip Ratio 1.04 %    Triceps Skinfold 15 mm    % Body Fat 28.2 %    Grip Strength 23 kg    Flexibility --   not done- back issues   Single Leg  Stand 0.2 seconds           Nutrition Therapy Plan and Nutrition Goals:   Nutrition Assessments:  MEDIFICTS Score Key: >=70 Need to make dietary changes  40-70 Heart Healthy Diet <= 40 Therapeutic Level Cholesterol Diet   Flowsheet Row INTENSIVE CARDIAC REHAB from 01/16/2024 in [facility]  Picture Your Plate Total Score on Admission 73   Picture Your Plate Scores: <59 Unhealthy dietary pattern with much room for improvement. 41-50 Dietary pattern unlikely to meet recommendations for good health and room for improvement. 51-60 More healthful dietary pattern, with some room for improvement.  >60 Healthy dietary pattern, although there may be some specific behaviors that could be improved.    Nutrition Goals Re-Evaluation:   Nutrition Goals Re-Evaluation:   Nutrition Goals Discharge (Final Nutrition Goals Re-Evaluation):   Psychosocial: Target Goals: Acknowledge presence or absence of significant depression and/or stress, maximize coping skills, provide positive support system. Participant is able to verbalize types and ability to use techniques and skills needed for reducing stress and depression.  Initial Review & Psychosocial Screening:  Initial Psych Review & Screening - 12/21/23 1050       Initial Review   Current issues with Current Stress Concerns    Source of Stress Concerns Family;Financial    Comments Eydan shared that he has some stress relating to financially supporting his son through a divorce along with other family issues. He denies any depression/anxiety and has a good outlook and positive mindset. Support offered, denies need for additional resources at this time.      Family Dynamics   Good Support System? Yes   wife     Barriers   Psychosocial barriers to participate in program The patient should benefit from training in stress management and relaxation.      Screening Interventions    Interventions Provide feedback about the scores to participant;Encouraged to exercise;To provide support and resources with identified psychosocial needs    Expected Outcomes Long Term goal: The participant improves quality of Life and PHQ9 Scores as seen by post scores and/or verbalization of changes;Short Term goal: Identification and review with participant of any Quality of Life or Depression concerns found by scoring the questionnaire.;Long Term Goal: Stressors or current issues are controlled or eliminated.          Quality of Life Scores:  Quality of Life - 12/21/23 1052       Quality of Life   Select Quality of Life      Quality of Life Scores   Health/Function Pre 21.68 %    Socioeconomic Pre 27.3 %    Psych/Spiritual Pre 24.92 %    Family Pre 23.7 %    GLOBAL Pre 23.6 %         Scores of 19 and below usually indicate a poorer quality of life in these areas.  A difference of  2-3 points is a clinically meaningful difference.  A difference of 2-3 points in the total score of the Quality of Life Index has been associated with significant improvement in overall quality of  life, self-image, physical symptoms, and general health in studies assessing change in quality of life.  PHQ-9: Review Flowsheet       12/21/2023  Depression screen PHQ 2/9  Decreased Interest 0  Down, Depressed, Hopeless 1  PHQ - 2 Score 1  Altered sleeping 0  Tired, decreased energy 0  Change in appetite 1  Feeling bad or failure about yourself  0  Trouble concentrating 1  Moving slowly or fidgety/restless 0  Suicidal thoughts 0  PHQ-9 Score 3  Difficult doing work/chores Not difficult at all   Interpretation of Total Score  Total Score Depression Severity:  1-4 = Minimal depression, 5-9 = Mild depression, 10-14 = Moderate depression, 15-19 = Moderately severe depression, 20-27 = Severe depression   Psychosocial Evaluation and Intervention:   Psychosocial Re-Evaluation:  Psychosocial  Re-Evaluation     Row Name 01/08/24 1522 01/31/24 1208           Psychosocial Re-Evaluation   Current issues with Current Stress Concerns Current Stress Concerns      Comments Nicolae has not voiced any increased concerns or stressors during exercise at cardiac rehab. Will discuss PHQ9 in the upcoming future, Discussed PHQ9. Kingston says he has had a lot going on. Jamaul says his son in the [employer] is going through a divorce. Jasun says he had been overeating. Javarion has made some dietary changes and is making healthier food choices. Cyril says he is eating more vegetables and has changed to drinking skim milk.  Danen says he feels better since he has started cardiac rehab      Expected Outcomes Nicolis will have controlled or decreased concerns or stressors during exercise at cardiac rehab Jamorris will have controlled or decreased concerns or stressors during exercise at cardiac rehab      Interventions Stress management education;Relaxation education;Encouraged to attend Cardiac Rehabilitation for the exercise Stress management education;Relaxation education;Encouraged to attend Cardiac Rehabilitation for the exercise      Continue Psychosocial Services  Follow up required by staff No Follow up required        Initial Review   Source of Stress Concerns Family Family      Comments Will continue to monitor and offer support as needed. Will continue to monitor and offer support as needed.         Psychosocial Discharge (Final Psychosocial Re-Evaluation):  Psychosocial Re-Evaluation - 01/31/24 1208       Psychosocial Re-Evaluation   Current issues with Current Stress Concerns    Comments Discussed PHQ9. Ashely says he has had a lot going on. Gaylon says his son in the [employer] is going through a divorce. Nekhi says he had been overeating. Zong has made some dietary changes and is making healthier food choices. Bradan says he is eating more vegetables and has changed to drinking skim milk.  Dabney says  he feels better since he has started cardiac rehab    Expected Outcomes Amiir will have controlled or decreased concerns or stressors during exercise at cardiac rehab    Interventions Stress management education;Relaxation education;Encouraged to attend Cardiac Rehabilitation for the exercise    Continue Psychosocial Services  No Follow up required      Initial Review   Source of Stress Concerns Family    Comments Will continue to monitor and offer support as needed.          Vocational Rehabilitation: Provide vocational rehab assistance to qualifying candidates.   Vocational Rehab Evaluation & Intervention:  Vocational Rehab -  12/21/23 1055       Initial Vocational Rehab Evaluation & Intervention   Assessment shows need for Vocational Rehabilitation No   retired         Education: Education Goals: Education classes will be provided on a weekly basis, covering required topics. Participant will state understanding/return demonstration of topics presented.    Education     Row Name 12/21/23 0900     Education   Cardiac Education Topics Pritikin   Select Core Videos     Core Videos   Educator Dietitian   Select Nutrition   Nutrition Vitamins and Minerals   Instruction Review Code 1- Verbalizes Understanding   Class Start Time 0815   Class Stop Time 0855   Class Time Calculation (min) 40 min    Row Name 01/02/24 0800     Education   Cardiac Education Topics Pritikin   [employer]     Cooking School   Educator Nurse;Respiratory Therapist   Weekly Topic Simple Sides and Sauces   Instruction Review Code 1- Verbalizes Understanding   Class Start Time [phone]   Class Stop Time 0854   Class Time Calculation (min) 43 min    Row Name 01/04/24 0900     Education   Cardiac Education Topics Pritikin   Select Core Videos     Core Videos   Educator Exercise Physiologist   Select Psychosocial   Psychosocial How Our Thoughts Can Heal Our Hearts   Instruction  Review Code 1- Verbalizes Understanding   Class Start Time 0815   Class Stop Time 0900   Class Time Calculation (min) 45 min    Row Name 01/07/24 0900     Education   Cardiac Education Topics Pritikin   Psychologist, Forensic General Education   General Education Heart Disease Risk Reduction   Instruction Review Code 1- Verbalizes Understanding   Class Start Time 0818   Class Stop Time 0853   Class Time Calculation (min) 35 min    Row Name 01/09/24 0800     Education   Cardiac Education Topics Pritikin   Secondary School Teacher School   Educator Nurse   Weekly Topic Powerhouse Plant-Based Proteins   Instruction Review Code 1- Verbalizes Understanding   Class Start Time 0815   Class Stop Time 0848   Class Time Calculation (min) 33 min    Row Name 01/11/24 0800     Education   Cardiac Education Topics Pritikin   Select Core Videos     Core Videos   Educator Dietitian   Select Nutrition   Nutrition Facts on Fat   Instruction Review Code 1- Verbalizes Understanding   Class Start Time [phone]   Class Stop Time 0853   Class Time Calculation (min) 37 min    Row Name 01/14/24 0800     Education   Cardiac Education Topics Pritikin   Geographical Information Systems Officer Psychosocial   Psychosocial Workshop From Head to Heart: The Power of a Healthy Outlook   Instruction Review Code 1- Verbalizes Understanding   Class Start Time [phone]   Class Stop Time 0857   Class Time Calculation (min) 43 min    Row Name 01/16/24 0800     Education   Cardiac Education Topics Pritikin   [employer]  [employer]   Copywriter, Advertising and Snacks   Instruction Review Code 1- Verbalizes Understanding    Row Name 01/16/24 0900     Education   Cardiac Education Topics Pritikin   Librarian, Academic School   Educator Nurse;Respiratory Therapist   Weekly Topic Tasty Appetizers and Snacks   Instruction Review Code 1- Verbalizes Understanding   Class Start Time 0815   Class Stop Time 0846   Class Time Calculation (min) 31 min    Row Name 01/18/24 0800     Education   Cardiac Education Topics Pritikin   [employer] Workshops     Workshops   Educator Exercise Physiologist   [facility] Exercise   Exercise Workshop Managing Heart Disease: Your Path to a Healthier Heart   Instruction Review Code 1- Verbalizes Understanding   Class Start Time [phone]   Class Stop Time 0900   Class Time Calculation (min) 41 min    Row Name 01/21/24 0800     Education   Cardiac Education Topics Pritikin   [employer] Core Videos     Core Videos   Educator Exercise Physiologist   [facility] Psychosocial   Psychosocial Healthy Minds, Bodies, Hearts   Instruction Review Code 1- Verbalizes Understanding   Class Start Time 0815   Class Stop Time 0847   Class Time Calculation (min) 32 min    Row Name 01/23/24 0800     Education   Cardiac Education Topics Pritikin   Secondary School Teacher School   Educator Nurse   Weekly Topic Adding Flavor - Sodium-Free   Instruction Review Code 1- Verbalizes Understanding   Class Start Time 0815   Class Stop Time 0847   Class Time Calculation (min) 32 min    Row Name 01/25/24 0800     Education   Cardiac Education Topics Pritikin   Glass Blower/designer Nutrition   Nutrition Workshop Label Reading   Instruction Review Code 1- Verbalizes Understanding   Class Start Time 0815   Class Stop Time [phone]   Class Time Calculation (min) 37 min    Row Name 01/28/24 0800     Education   Cardiac Education Topics Pritikin   [employer] Workshops     Workshops   Educator Exercise Physiologist   [facility] Exercise   Exercise Workshop Location Manager and Fall Prevention   Instruction Review Code 1- Verbalizes  Understanding   Class Start Time 0815   Class Stop Time 0850   Class Time Calculation (min) 35 min    Row Name 01/30/24 0900     Education   Cardiac Education Topics Pritikin   Secondary School Teacher School   Educator Respiratory Therapist;Nurse   Weekly Topic Fast and Healthy Breakfasts   Instruction Review Code 1- Verbalizes Understanding   Class Start Time 0815   Class Stop Time [phone]   Class Time Calculation (min) 37 min      Core Videos: Exercise    Move It!  Clinical staff conducted group or individual video education with verbal and written material and guidebook.  Patient learns the recommended Pritikin exercise program. Exercise with the goal of living a long, healthy life. Some of the health benefits of exercise include controlled diabetes, healthier blood pressure levels, improved cholesterol levels, improved heart and lung capacity, improved sleep, and better body composition.  Everyone should speak with their doctor before starting or changing an exercise routine.  Biomechanical Limitations Clinical staff conducted group or individual video education with verbal and written material and guidebook.  Patient learns how biomechanical limitations can impact exercise and how we can mitigate and possibly overcome limitations to have an impactful and balanced exercise routine.  Body Composition Clinical staff conducted group or individual video education with verbal and written material and guidebook.  Patient learns that body composition (ratio of muscle mass to fat mass) is a key component to assessing overall fitness, rather than body weight alone. Increased fat mass, especially visceral belly fat, can put us  at increased risk for metabolic syndrome, type 2 diabetes, heart disease, and even death. It is recommended to combine diet and exercise (cardiovascular and resistance training) to improve your body composition. Seek guidance from your physician and exercise  physiologist before implementing an exercise routine.  Exercise Action Plan Clinical staff conducted group or individual video education with verbal and written material and guidebook.  Patient learns the recommended strategies to achieve and enjoy long-term exercise adherence, including variety, self-motivation, self-efficacy, and positive decision making. Benefits of exercise include fitness, good health, weight management, more energy, better sleep, less stress, and overall well-being.  Medical   Heart Disease Risk Reduction Clinical staff conducted group or individual video education with verbal and written material and guidebook.  Patient learns our heart is our most vital organ as it circulates oxygen, nutrients, white blood cells, and hormones throughout the entire body, and carries waste away. Data supports a plant-based eating plan like the Pritikin Program for its effectiveness in slowing progression of and reversing heart disease. The video provides a number of recommendations to address heart disease.   Metabolic Syndrome and Belly Fat  Clinical staff conducted group or individual video education with verbal and written material and guidebook.  Patient learns what metabolic syndrome is, how it leads to heart disease, and how one can reverse it and keep it from coming back. You have metabolic syndrome if you have 3 of the following 5 criteria: abdominal obesity, high blood pressure, high triglycerides, low HDL cholesterol, and high blood sugar.  Hypertension and Heart Disease Clinical staff conducted group or individual video education with verbal and written material and guidebook.  Patient learns that high blood pressure, or hypertension, is very common in the [place] . Hypertension is largely due to excessive salt intake, but other important risk factors include being overweight, physical inactivity, drinking too much alcohol , smoking, and not eating enough potassium from fruits  and vegetables. High blood pressure is a leading risk factor for heart attack, stroke, congestive heart failure, dementia, kidney failure, and premature death. Long-term effects of excessive salt intake include stiffening of the arteries and thickening of heart muscle and organ damage. Recommendations include ways to reduce hypertension and the risk of heart disease.  Diseases of Our Time - Focusing on Diabetes Clinical staff conducted group or individual video education with verbal and written material and guidebook.  Patient learns why the best way to stop diseases of our time is prevention, through food and other lifestyle changes. Medicine (such as prescription pills and surgeries) is often only a Band-Aid on the problem, not a long-term solution. Most common diseases of our time include obesity, type 2 diabetes, hypertension, heart disease, and cancer. The Pritikin Program is recommended and has been proven to help reduce, reverse, and/or prevent the damaging effects of metabolic syndrome.  Nutrition   Overview of  the Pritikin Eating Plan  Clinical staff conducted group or individual video education with verbal and written material and guidebook.  Patient learns about the Pritikin Eating Plan for disease risk reduction. The Pritikin Eating Plan emphasizes a wide variety of unrefined, minimally-processed carbohydrates, like fruits, vegetables, whole grains, and legumes. Go, Caution, and Stop food choices are explained. Plant-based and lean animal proteins are emphasized. Rationale provided for low sodium intake for blood pressure control, low added sugars for blood sugar stabilization, and low added fats and oils for coronary artery disease risk reduction and weight management.  Calorie Density  Clinical staff conducted group or individual video education with verbal and written material and guidebook.  Patient learns about calorie density and how it impacts the Pritikin Eating Plan. Knowing the  characteristics of the food you choose will help you decide whether those foods will lead to weight gain or weight loss, and whether you want to consume more or less of them. Weight loss is usually a side effect of the Pritikin Eating Plan because of its focus on low calorie-dense foods.  Label Reading  Clinical staff conducted group or individual video education with verbal and written material and guidebook.  Patient learns about the Pritikin recommended label reading guidelines and corresponding recommendations regarding calorie density, added sugars, sodium content, and whole grains.  Dining Out - Part 1  Clinical staff conducted group or individual video education with verbal and written material and guidebook.  Patient learns that restaurant meals can be sabotaging because they can be so high in calories, fat, sodium, and/or sugar. Patient learns recommended strategies on how to positively address this and avoid unhealthy pitfalls.  Facts on Fats  Clinical staff conducted group or individual video education with verbal and written material and guidebook.  Patient learns that lifestyle modifications can be just as effective, if not more so, as many medications for lowering your risk of heart disease. A Pritikin lifestyle can help to reduce your risk of inflammation and atherosclerosis (cholesterol build-up, or plaque, in the artery walls). Lifestyle interventions such as dietary choices and physical activity address the cause of atherosclerosis. A review of the types of fats and their impact on blood cholesterol levels, along with dietary recommendations to reduce fat intake is also included.  Nutrition Action Plan  Clinical staff conducted group or individual video education with verbal and written material and guidebook.  Patient learns how to incorporate Pritikin recommendations into their lifestyle. Recommendations include planning and keeping personal health goals in mind as an important  part of their success.  Healthy Mind-Set    Healthy Minds, Bodies, Hearts  Clinical staff conducted group or individual video education with verbal and written material and guidebook.  Patient learns how to identify when they are stressed. Video will discuss the impact of that stress, as well as the many benefits of stress management. Patient will also be introduced to stress management techniques. The way we think, act, and feel has an impact on our hearts.  How Our Thoughts Can Heal Our Hearts  Clinical staff conducted group or individual video education with verbal and written material and guidebook.  Patient learns that negative thoughts can cause depression and anxiety. This can result in negative lifestyle behavior and serious health problems. Cognitive behavioral therapy is an effective method to help control our thoughts in order to change and improve our emotional outlook.  Additional Videos:  Exercise    Improving Performance  Clinical staff conducted group or individual video education  with verbal and written material and guidebook.  Patient learns to use a non-linear approach by alternating intensity levels and lengths of time spent exercising to help burn more calories and lose more body fat. Cardiovascular exercise helps improve heart health, metabolism, hormonal balance, blood sugar control, and recovery from fatigue. Resistance training improves strength, endurance, balance, coordination, reaction time, metabolism, and muscle mass. Flexibility exercise improves circulation, posture, and balance. Seek guidance from your physician and exercise physiologist before implementing an exercise routine and learn your capabilities and proper form for all exercise.  Introduction to Yoga  Clinical staff conducted group or individual video education with verbal and written material and guidebook.  Patient learns about yoga, a discipline of the coming together of mind, breath, and body. The  benefits of yoga include improved flexibility, improved range of motion, better posture and core strength, increased lung function, weight loss, and positive self-image. Yoga's heart health benefits include lowered blood pressure, healthier heart rate, decreased cholesterol and triglyceride levels, improved immune function, and reduced stress. Seek guidance from your physician and exercise physiologist before implementing an exercise routine and learn your capabilities and proper form for all exercise.  Medical   Aging: Enhancing Your Quality of Life  Clinical staff conducted group or individual video education with verbal and written material and guidebook.  Patient learns key strategies and recommendations to stay in good physical health and enhance quality of life, such as prevention strategies, having an advocate, securing a Health Care Proxy and Power of Attorney, and keeping a list of medications and system for tracking them. It also discusses how to avoid risk for bone loss.  Biology of Weight Control  Clinical staff conducted group or individual video education with verbal and written material and guidebook.  Patient learns that weight gain occurs because we consume more calories than we burn (eating more, moving less). Even if your body weight is normal, you may have higher ratios of fat compared to muscle mass. Too much body fat puts you at increased risk for cardiovascular disease, heart attack, stroke, type 2 diabetes, and obesity-related cancers. In addition to exercise, following the Pritikin Eating Plan can help reduce your risk.  Decoding Lab Results  Clinical staff conducted group or individual video education with verbal and written material and guidebook.  Patient learns that lab test reflects one measurement whose values change over time and are influenced by many factors, including medication, stress, sleep, exercise, food, hydration, pre-existing medical conditions, and more. It is  recommended to use the knowledge from this video to become more involved with your lab results and evaluate your numbers to speak with your doctor.   Diseases of Our Time - Overview  Clinical staff conducted group or individual video education with verbal and written material and guidebook.  Patient learns that according to the CDC, 50% to 70% of chronic diseases (such as obesity, type 2 diabetes, elevated lipids, hypertension, and heart disease) are avoidable through lifestyle improvements including healthier food choices, listening to satiety cues, and increased physical activity.  Sleep Disorders Clinical staff conducted group or individual video education with verbal and written material and guidebook.  Patient learns how good quality and duration of sleep are important to overall health and well-being. Patient also learns about sleep disorders and how they impact health along with recommendations to address them, including discussing with a physician.  Nutrition  Dining Out - Part 2 Clinical staff conducted group or individual video education with verbal and written material and  guidebook.  Patient learns how to plan ahead and communicate in order to maximize their dining experience in a healthy and nutritious manner. Included are recommended food choices based on the type of restaurant the patient is visiting.   Fueling a Banker conducted group or individual video education with verbal and written material and guidebook.  There is a strong connection between our food choices and our health. Diseases like obesity and type 2 diabetes are very prevalent and are in large-part due to lifestyle choices. The Pritikin Eating Plan provides plenty of food and hunger-curbing satisfaction. It is easy to follow, affordable, and helps reduce health risks.  Menu Workshop  Clinical staff conducted group or individual video education with verbal and written material and guidebook.   Patient learns that restaurant meals can sabotage health goals because they are often packed with calories, fat, sodium, and sugar. Recommendations include strategies to plan ahead and to communicate with the manager, chef, or server to help order a healthier meal.  Planning Your Eating Strategy  Clinical staff conducted group or individual video education with verbal and written material and guidebook.  Patient learns about the Pritikin Eating Plan and its benefit of reducing the risk of disease. The Pritikin Eating Plan does not focus on calories. Instead, it emphasizes high-quality, nutrient-rich foods. By knowing the characteristics of the foods, we choose, we can determine their calorie density and make informed decisions.  Targeting Your Nutrition Priorities  Clinical staff conducted group or individual video education with verbal and written material and guidebook.  Patient learns that lifestyle habits have a tremendous impact on disease risk and progression. This video provides eating and physical activity recommendations based on your personal health goals, such as reducing LDL cholesterol, losing weight, preventing or controlling type 2 diabetes, and reducing high blood pressure.  Vitamins and Minerals  Clinical staff conducted group or individual video education with verbal and written material and guidebook.  Patient learns different ways to obtain key vitamins and minerals, including through a recommended healthy diet. It is important to discuss all supplements you take with your doctor.   Healthy Mind-Set    Smoking Cessation  Clinical staff conducted group or individual video education with verbal and written material and guidebook.  Patient learns that cigarette smoking and tobacco addiction pose a serious health risk which affects millions of people. Stopping smoking will significantly reduce the risk of heart disease, lung disease, and many forms of cancer. Recommended  strategies for quitting are covered, including working with your doctor to develop a successful plan.  Culinary   Becoming a Set Designer conducted group or individual video education with verbal and written material and guidebook.  Patient learns that cooking at home can be healthy, cost-effective, quick, and puts them in control. Keys to cooking healthy recipes will include looking at your recipe, assessing your equipment needs, planning ahead, making it simple, choosing cost-effective seasonal ingredients, and limiting the use of added fats, salts, and sugars.  Cooking - Breakfast and Snacks  Clinical staff conducted group or individual video education with verbal and written material and guidebook.  Patient learns how important breakfast is to satiety and nutrition through the entire day. Recommendations include key foods to eat during breakfast to help stabilize blood sugar levels and to prevent overeating at meals later in the day. Planning ahead is also a key component.  Cooking - Educational Psychologist conducted group or individual video education  with verbal and written material and guidebook.  Patient learns eating strategies to improve overall health, including an approach to cook more at home. Recommendations include thinking of animal protein as a side on your plate rather than center stage and focusing instead on lower calorie dense options like vegetables, fruits, whole grains, and plant-based proteins, such as beans. Making sauces in large quantities to freeze for later and leaving the skin on your vegetables are also recommended to maximize your experience.  Cooking - Healthy Salads and Dressing Clinical staff conducted group or individual video education with verbal and written material and guidebook.  Patient learns that vegetables, fruits, whole grains, and legumes are the foundations of the Pritikin Eating Plan. Recommendations include how to  incorporate each of these in flavorful and healthy salads, and how to create homemade salad dressings. Proper handling of ingredients is also covered. Cooking - Soups and [employer] - Soups and Desserts Clinical staff conducted group or individual video education with verbal and written material and guidebook.  Patient learns that Pritikin soups and desserts make for easy, nutritious, and delicious snacks and meal components that are low in sodium, fat, sugar, and calorie density, while high in vitamins, minerals, and filling fiber. Recommendations include simple and healthy ideas for soups and desserts.   Overview     The Pritikin Solution Program Overview Clinical staff conducted group or individual video education with verbal and written material and guidebook.  Patient learns that the results of the Pritikin Program have been documented in more than 100 articles published in peer-reviewed journals, and the benefits include reducing risk factors for (and, in some cases, even reversing) high cholesterol, high blood pressure, type 2 diabetes, obesity, and more! An overview of the three key pillars of the Pritikin Program will be covered: eating well, doing regular exercise, and having a healthy mind-set.  WORKSHOPS  Exercise: Exercise Basics: Building Your Action Plan Clinical staff led group instruction and group discussion with PowerPoint presentation and patient guidebook. To enhance the learning environment the use of posters, models and videos may be added. At the conclusion of this workshop, patients will comprehend the difference between physical activity and exercise, as well as the benefits of incorporating both, into their routine. Patients will understand the FITT (Frequency, Intensity, Time, and Type) principle and how to use it to build an exercise action plan. In addition, safety concerns and other considerations for exercise and cardiac rehab will be addressed by the  presenter. The purpose of this lesson is to promote a comprehensive and effective weekly exercise routine in order to improve patients' overall level of fitness.   Managing Heart Disease: Your Path to a Healthier Heart Clinical staff led group instruction and group discussion with PowerPoint presentation and patient guidebook. To enhance the learning environment the use of posters, models and videos may be added.At the conclusion of this workshop, patients will understand the anatomy and physiology of the heart. Additionally, they will understand how Pritikin's three pillars impact the risk factors, the progression, and the management of heart disease.  The purpose of this lesson is to provide a high-level overview of the heart, heart disease, and how the Pritikin lifestyle positively impacts risk factors.  Exercise Biomechanics Clinical staff led group instruction and group discussion with PowerPoint presentation and patient guidebook. To enhance the learning environment the use of posters, models and videos may be added. Patients will learn how the structural parts of their bodies function and how these functions impact  their daily activities, movement, and exercise. Patients will learn how to promote a neutral spine, learn how to manage pain, and identify ways to improve their physical movement in order to promote healthy living. The purpose of this lesson is to expose patients to common physical limitations that impact physical activity. Participants will learn practical ways to adapt and manage aches and pains, and to minimize their effect on regular exercise. Patients will learn how to maintain good posture while sitting, walking, and lifting.  Balance Training and Fall Prevention  Clinical staff led group instruction and group discussion with PowerPoint presentation and patient guidebook. To enhance the learning environment the use of posters, models and videos may be added. At the  conclusion of this workshop, patients will understand the importance of their sensorimotor skills (vision, proprioception, and the vestibular system) in maintaining their ability to balance as they age. Patients will apply a variety of balancing exercises that are appropriate for their current level of function. Patients will understand the common causes for poor balance, possible solutions to these problems, and ways to modify their physical environment in order to minimize their fall risk. The purpose of this lesson is to teach patients about the importance of maintaining balance as they age and ways to minimize their risk of falling.  WORKSHOPS   Nutrition:  Fueling a Ship Broker led group instruction and group discussion with PowerPoint presentation and patient guidebook. To enhance the learning environment the use of posters, models and videos may be added. Patients will review the foundational principles of the Pritikin Eating Plan and understand what constitutes a serving size in each of the food groups. Patients will also learn Pritikin-friendly foods that are better choices when away from home and review make-ahead meal and snack options. Calorie density will be reviewed and applied to three nutrition priorities: weight maintenance, weight loss, and weight gain. The purpose of this lesson is to reinforce (in a group setting) the key concepts around what patients are recommended to eat and how to apply these guidelines when away from home by planning and selecting Pritikin-friendly options. Patients will understand how calorie density may be adjusted for different weight management goals.  Mindful Eating  Clinical staff led group instruction and group discussion with PowerPoint presentation and patient guidebook. To enhance the learning environment the use of posters, models and videos may be added. Patients will briefly review the concepts of the Pritikin Eating Plan and the  importance of low-calorie dense foods. The concept of mindful eating will be introduced as well as the importance of paying attention to internal hunger signals. Triggers for non-hunger eating and techniques for dealing with triggers will be explored. The purpose of this lesson is to provide patients with the opportunity to review the basic principles of the Pritikin Eating Plan, discuss the value of eating mindfully and how to measure internal cues of hunger and fullness using the Hunger Scale. Patients will also discuss reasons for non-hunger eating and learn strategies to use for controlling emotional eating.  Targeting Your Nutrition Priorities Clinical staff led group instruction and group discussion with PowerPoint presentation and patient guidebook. To enhance the learning environment the use of posters, models and videos may be added. Patients will learn how to determine their genetic susceptibility to disease by reviewing their family history. Patients will gain insight into the importance of diet as part of an overall healthy lifestyle in mitigating the impact of genetics and other environmental insults. The purpose of this  lesson is to provide patients with the opportunity to assess their personal nutrition priorities by looking at their family history, their own health history and current risk factors. Patients will also be able to discuss ways of prioritizing and modifying the Pritikin Eating Plan for their highest risk areas  Menu  Clinical staff led group instruction and group discussion with PowerPoint presentation and patient guidebook. To enhance the learning environment the use of posters, models and videos may be added. Using menus brought in from [employer], or printed from [employer], patients will apply the Pritikin dining out guidelines that were presented in the [employer] video. Patients will also be able to practice these guidelines in a variety of  provided scenarios. The purpose of this lesson is to provide patients with the opportunity to practice hands-on learning of the Pritikin Dining Out guidelines with actual menus and practice scenarios.  Label Reading Clinical staff led group instruction and group discussion with PowerPoint presentation and patient guidebook. To enhance the learning environment the use of posters, models and videos may be added. Patients will review and discuss the Pritikin label reading guidelines presented in Pritikin's Label Reading Educational series video. Using fool labels brought in from local grocery stores and markets, patients will apply the label reading guidelines and determine if the packaged food meet the Pritikin guidelines. The purpose of this lesson is to provide patients with the opportunity to review, discuss, and practice hands-on learning of the Pritikin Label Reading guidelines with actual packaged food labels. Cooking School  Pritikin's [employer] are designed to teach patients ways to prepare quick, simple, and affordable recipes at home. The importance of nutrition's role in chronic disease risk reduction is reflected in its emphasis in the overall Pritikin program. By learning how to prepare essential core Pritikin Eating Plan recipes, patients will increase control over what they eat; be able to customize the flavor of foods without the use of added salt, sugar, or fat; and improve the quality of the food they consume. By learning a set of core recipes which are easily assembled, quickly prepared, and affordable, patients are more likely to prepare more healthy foods at home. These workshops focus on convenient breakfasts, simple entres, side dishes, and desserts which can be prepared with minimal effort and are consistent with nutrition recommendations for cardiovascular risk reduction. Cooking [employer] are taught by a armed forces logistics/support/administrative officer (RD) who has been trained by the  [employer]. The chef or RD has a clear understanding of the importance of minimizing - if not completely eliminating - added fat, sugar, and sodium in recipes. Throughout the series of Cooking School Workshop sessions, patients will learn about healthy ingredients and efficient methods of cooking to build confidence in their capability to prepare    Cooking School weekly topics:  Adding Flavor- Sodium-Free  Fast and Healthy Breakfasts  Powerhouse Plant-Based Proteins  Satisfying Salads and Dressings  Simple Sides and Sauces  International Cuisine-Spotlight on the [employer] Zones  Delicious Desserts  Savory Soups  [employer] - Meals in a Astronomer Appetizers and Snacks  Comforting Weekend Breakfasts  One-Pot Wonders   Fast Evening Meals  Landscape Architect Your Pritikin Plate  WORKSHOPS   Healthy Mindset (Psychosocial):  Focused Goals, Sustainable Changes Clinical staff led group instruction and group discussion with PowerPoint presentation and patient guidebook. To enhance the learning environment the use of posters, models and videos may be added. Patients will  be able to apply effective goal setting strategies to establish at least one personal goal, and then take consistent, meaningful action toward that goal. They will learn to identify common barriers to achieving personal goals and develop strategies to overcome them. Patients will also gain an understanding of how our mind-set can impact our ability to achieve goals and the importance of cultivating a positive and growth-oriented mind-set. The purpose of this lesson is to provide patients with a deeper understanding of how to set and achieve personal goals, as well as the tools and strategies needed to overcome common obstacles which may arise along the way.  From Head to Heart: The Power of a Healthy Outlook  Clinical staff led group instruction and group discussion with PowerPoint presentation  and patient guidebook. To enhance the learning environment the use of posters, models and videos may be added. Patients will be able to recognize and describe the impact of emotions and mood on physical health. They will discover the importance of self-care and explore self-care practices which may work for them. Patients will also learn how to utilize the 4 C's to cultivate a healthier outlook and better manage stress and challenges. The purpose of this lesson is to demonstrate to patients how a healthy outlook is an essential part of maintaining good health, especially as they continue their cardiac rehab journey.  Healthy Sleep for a Healthy Heart Clinical staff led group instruction and group discussion with PowerPoint presentation and patient guidebook. To enhance the learning environment the use of posters, models and videos may be added. At the conclusion of this workshop, patients will be able to demonstrate knowledge of the importance of sleep to overall health, well-being, and quality of life. They will understand the symptoms of, and treatments for, common sleep disorders. Patients will also be able to identify daytime and nighttime behaviors which impact sleep, and they will be able to apply these tools to help manage sleep-related challenges. The purpose of this lesson is to provide patients with a general overview of sleep and outline the importance of quality sleep. Patients will learn about a few of the most common sleep disorders. Patients will also be introduced to the concept of "sleep hygiene," and discover ways to self-manage certain sleeping problems through simple daily behavior changes. Finally, the workshop will motivate patients by clarifying the links between quality sleep and their goals of heart-healthy living.   Recognizing and Reducing Stress Clinical staff led group instruction and group discussion with PowerPoint presentation and patient guidebook. To enhance the learning  environment the use of posters, models and videos may be added. At the conclusion of this workshop, patients will be able to understand the types of stress reactions, differentiate between acute and chronic stress, and recognize the impact that chronic stress has on their health. They will also be able to apply different coping mechanisms, such as reframing negative self-talk. Patients will have the opportunity to practice a variety of stress management techniques, such as deep abdominal breathing, progressive muscle relaxation, and/or guided imagery.  The purpose of this lesson is to educate patients on the role of stress in their lives and to provide healthy techniques for coping with it.  Learning Barriers/Preferences:  Learning Barriers/Preferences - 12/21/23 1053       Learning Barriers/Preferences   Learning Barriers Sight   glasses   Learning Preferences Computer/Internet;Pictoral;Written Material   reading         Education Topics:  Knowledge Questionnaire Score:  Knowledge Questionnaire Score -  12/21/23 1053       Knowledge Questionnaire Score   Pre Score 22/24          Core Components/Risk Factors/Patient Goals at Admission:  Personal Goals and Risk Factors at Admission - 12/21/23 1055       Core Components/Risk Factors/Patient Goals on Admission    Weight Management Yes    Intervention Weight Management: Develop a combined nutrition and exercise program designed to reach desired caloric intake, while maintaining appropriate intake of nutrient and fiber, sodium and fats, and appropriate energy expenditure required for the weight goal.;Weight Management: Provide education and appropriate resources to help participant work on and attain dietary goals.    Expected Outcomes Short Term: Continue to assess and modify interventions until short term weight is achieved;Long Term: Adherence to nutrition and physical activity/exercise program aimed toward attainment of established  weight goal;Understanding recommendations for meals to include 15-35% energy as protein, 25-35% energy from fat, 35-60% energy from carbohydrates, less than 200mg  of dietary cholesterol, 20-35 gm of total fiber daily;Understanding of distribution of calorie intake throughout the day with the consumption of 4-5 meals/snacks    Hypertension Yes    Intervention Provide education on lifestyle modifcations including regular physical activity/exercise, weight management, moderate sodium restriction and increased consumption of fresh fruit, vegetables, and low fat dairy, alcohol  moderation, and smoking cessation.;Monitor prescription use compliance.    Expected Outcomes Short Term: Continued assessment and intervention until BP is < 140/32mm HG in hypertensive participants. < 130/51mm HG in hypertensive participants with diabetes, heart failure or chronic kidney disease.;Long Term: Maintenance of blood pressure at goal levels.    Lipids Yes    Intervention Provide education and support for participant on nutrition & aerobic/resistive exercise along with prescribed medications to achieve LDL 70mg , HDL >40mg .    Expected Outcomes Long Term: Cholesterol controlled with medications as prescribed, with individualized exercise RX and with personalized nutrition plan. Value goals: LDL < 70mg , HDL > 40 mg.;Short Term: Participant states understanding of desired cholesterol values and is compliant with medications prescribed. Participant is following exercise prescription and nutrition guidelines.    Stress Yes    Intervention Refer participants experiencing significant psychosocial distress to appropriate mental health specialists for further evaluation and treatment. When possible, include family members and significant others in education/counseling sessions.;Offer individual and/or small group education and counseling on adjustment to heart disease, stress management and health-related lifestyle change. Teach and  support self-help strategies.    Expected Outcomes Long Term: Emotional wellbeing is indicated by absence of clinically significant psychosocial distress or social isolation.;Short Term: Participant demonstrates changes in health-related behavior, relaxation and other stress management skills, ability to obtain effective social support, and compliance with psychotropic medications if prescribed.          Core Components/Risk Factors/Patient Goals Review:   Goals and Risk Factor Review     Row Name 01/08/24 1529 01/31/24 1209           Core Components/Risk Factors/Patient Goals Review   Personal Goals Review Weight Management/Obesity;Hypertension;Stress;Lipids Weight Management/Obesity;Hypertension;Stress;Lipids      Review Dontario started cardiac rehab 01/02/24. Flavius is off to a good start to exercise. Vital signs have been stable. Jhordan is doing well with exercise at cardiac rehab. Vital signs have been stable. Keenen has increased his met levels      Expected Outcomes Zackary will continue to participate in cardiac rehab for exercise, nutrition and lifestyle modificaitons Laroy will continue to participate in cardiac rehab for exercise, nutrition and lifestyle  modificaitons         Core Components/Risk Factors/Patient Goals at Discharge (Final Review):   Goals and Risk Factor Review - 01/31/24 1209       Core Components/Risk Factors/Patient Goals Review   Personal Goals Review Weight Management/Obesity;Hypertension;Stress;Lipids    Review Roma is doing well with exercise at cardiac rehab. Vital signs have been stable. Prabhav has increased his met levels    Expected Outcomes Aaronjames will continue to participate in cardiac rehab for exercise, nutrition and lifestyle modificaitons          ITP Comments:  ITP Comments     Row Name 12/21/23 9077 01/02/24 1406 01/31/24 1206       ITP Comments Dr. Wilbert Bihari medical director. Introduction to pritikin education/intensive cardiac rehab.  Initial orientation packet reviewed with patient. 30 Day ITP review.  Nymir started cardiac rehab on 01/02/24 and tolerated exercise well. 30 Day ITP review.  Nahmir has good attendance and participation with exercise at cardiac rehab        Comments: See ITP comments.Hadassah Elpidio Quan RN BSN

## 2024-02-01 ENCOUNTER — Encounter (HOSPITAL_COMMUNITY)

## 2024-02-04 ENCOUNTER — Encounter: Payer: Self-pay | Admitting: Radiology

## 2024-02-04 ENCOUNTER — Encounter (HOSPITAL_COMMUNITY)

## 2024-02-06 ENCOUNTER — Encounter (HOSPITAL_COMMUNITY)
Admission: RE | Admit: 2024-02-06 | Discharge: 2024-02-06 | Disposition: A | Discharge status: Home / Self Care | Source: Ambulatory Visit | Attending: Cardiology | Admitting: Cardiology

## 2024-02-06 ENCOUNTER — Encounter (HOSPITAL_COMMUNITY)

## 2024-02-06 DIAGNOSIS — Z955 Presence of coronary angioplasty implant and graft: Secondary | ICD-10-CM | POA: Diagnosis not present

## 2024-02-06 DIAGNOSIS — Z48812 Encounter for surgical aftercare following surgery on the circulatory system: Secondary | ICD-10-CM | POA: Diagnosis not present

## 2024-02-08 ENCOUNTER — Encounter (HOSPITAL_COMMUNITY)
Admission: RE | Admit: 2024-02-08 | Discharge: 2024-02-08 | Disposition: A | Discharge status: Home / Self Care | Source: Ambulatory Visit | Attending: Cardiology | Admitting: Cardiology

## 2024-02-08 DIAGNOSIS — Z955 Presence of coronary angioplasty implant and graft: Secondary | ICD-10-CM

## 2024-02-08 DIAGNOSIS — Z48812 Encounter for surgical aftercare following surgery on the circulatory system: Secondary | ICD-10-CM | POA: Diagnosis not present

## 2024-02-11 ENCOUNTER — Encounter (HOSPITAL_COMMUNITY)
Admission: RE | Admit: 2024-02-11 | Discharge: 2024-02-11 | Disposition: A | Discharge status: Home / Self Care | Source: Ambulatory Visit | Attending: Cardiology | Admitting: Cardiology

## 2024-02-11 DIAGNOSIS — Z955 Presence of coronary angioplasty implant and graft: Secondary | ICD-10-CM | POA: Diagnosis not present

## 2024-02-11 DIAGNOSIS — Z48812 Encounter for surgical aftercare following surgery on the circulatory system: Secondary | ICD-10-CM | POA: Diagnosis not present

## 2024-02-13 ENCOUNTER — Encounter (HOSPITAL_COMMUNITY)
Admission: RE | Admit: 2024-02-13 | Discharge: 2024-02-13 | Disposition: A | Discharge status: Home / Self Care | Source: Ambulatory Visit | Attending: Cardiology | Admitting: Cardiology

## 2024-02-13 DIAGNOSIS — Z955 Presence of coronary angioplasty implant and graft: Secondary | ICD-10-CM

## 2024-02-13 DIAGNOSIS — Z48812 Encounter for surgical aftercare following surgery on the circulatory system: Secondary | ICD-10-CM | POA: Diagnosis not present

## 2024-02-15 ENCOUNTER — Encounter (HOSPITAL_COMMUNITY)
Admission: RE | Admit: 2024-02-15 | Discharge: 2024-02-15 | Disposition: A | Discharge status: Home / Self Care | Source: Ambulatory Visit | Attending: Cardiology | Admitting: Cardiology

## 2024-02-15 DIAGNOSIS — Z955 Presence of coronary angioplasty implant and graft: Secondary | ICD-10-CM

## 2024-02-15 DIAGNOSIS — Z48812 Encounter for surgical aftercare following surgery on the circulatory system: Secondary | ICD-10-CM | POA: Diagnosis not present

## 2024-02-18 ENCOUNTER — Encounter (HOSPITAL_COMMUNITY)
Admission: RE | Admit: 2024-02-18 | Discharge: 2024-02-18 | Disposition: A | Discharge status: Home / Self Care | Source: Ambulatory Visit | Attending: Cardiology | Admitting: Cardiology

## 2024-02-18 DIAGNOSIS — Z955 Presence of coronary angioplasty implant and graft: Secondary | ICD-10-CM | POA: Diagnosis not present

## 2024-02-18 DIAGNOSIS — Z48812 Encounter for surgical aftercare following surgery on the circulatory system: Secondary | ICD-10-CM | POA: Diagnosis not present

## 2024-02-20 ENCOUNTER — Telehealth (HOSPITAL_COMMUNITY): Payer: Self-pay

## 2024-02-20 ENCOUNTER — Encounter (HOSPITAL_COMMUNITY)

## 2024-02-20 NOTE — Telephone Encounter (Signed)
 Patient c/o for 10:15 CR class due to house repairs.

## 2024-02-20 NOTE — Telephone Encounter (Signed)
-----   Message from Redell Shallow sent at 02/19/2024  6:20 PM EST ----- Regarding: RE: THR increase for Sharolyn [place] [facility] ----- Message ----- From: Janann Lenis Sent: 02/19/2024   3:54 PM EST To: Hadassah LELON Quan, RN; Redell GORMAN Shallow, MD Subject: THR increase for Sharolyn [place]                    Good afternoon Dr. Shallow,  I am requesting an increase in the [facility] for Arran from 58-117 to 58-132 (90%).  Resting blood pressures have ranged from 108-144/54-70; with exercise, 130-170/62-84.  Byard has been exercising without complaints and has completed 7 weeks of cardiac rehab.  Thank you for considering this request, please let me know if you need any additional information.  Regards,  Lenis Janann MS, ACSM-CEP, CCRP

## 2024-02-22 ENCOUNTER — Encounter (HOSPITAL_COMMUNITY)
Admission: RE | Admit: 2024-02-22 | Discharge: 2024-02-22 | Disposition: A | Discharge status: Home / Self Care | Source: Ambulatory Visit | Attending: Cardiology | Admitting: Cardiology

## 2024-02-22 DIAGNOSIS — Z955 Presence of coronary angioplasty implant and graft: Secondary | ICD-10-CM

## 2024-02-22 DIAGNOSIS — Z48812 Encounter for surgical aftercare following surgery on the circulatory system: Secondary | ICD-10-CM | POA: Diagnosis not present

## 2024-02-25 ENCOUNTER — Encounter (HOSPITAL_COMMUNITY)
Admission: RE | Admit: 2024-02-25 | Discharge: 2024-02-25 | Disposition: A | Source: Ambulatory Visit | Attending: Cardiology

## 2024-02-25 DIAGNOSIS — Z48812 Encounter for surgical aftercare following surgery on the circulatory system: Secondary | ICD-10-CM | POA: Diagnosis not present

## 2024-02-25 DIAGNOSIS — Z955 Presence of coronary angioplasty implant and graft: Secondary | ICD-10-CM

## 2024-02-27 ENCOUNTER — Encounter (HOSPITAL_COMMUNITY)
Admission: RE | Admit: 2024-02-27 | Discharge: 2024-02-27 | Disposition: A | Source: Ambulatory Visit | Attending: Cardiology

## 2024-02-27 DIAGNOSIS — Z48812 Encounter for surgical aftercare following surgery on the circulatory system: Secondary | ICD-10-CM | POA: Diagnosis not present

## 2024-02-27 DIAGNOSIS — Z955 Presence of coronary angioplasty implant and graft: Secondary | ICD-10-CM

## 2024-02-29 ENCOUNTER — Encounter (HOSPITAL_COMMUNITY)

## 2024-02-29 ENCOUNTER — Other Ambulatory Visit (HOSPITAL_COMMUNITY): Payer: Self-pay

## 2024-03-01 ENCOUNTER — Other Ambulatory Visit (HOSPITAL_COMMUNITY): Payer: Self-pay

## 2024-03-02 ENCOUNTER — Other Ambulatory Visit (HOSPITAL_COMMUNITY): Payer: Self-pay

## 2024-03-03 ENCOUNTER — Encounter (HOSPITAL_COMMUNITY)
Admission: RE | Admit: 2024-03-03 | Discharge: 2024-03-03 | Disposition: A | Discharge status: Home / Self Care | Source: Ambulatory Visit | Attending: Cardiology | Admitting: Cardiology

## 2024-03-03 DIAGNOSIS — Z955 Presence of coronary angioplasty implant and graft: Secondary | ICD-10-CM | POA: Insufficient documentation

## 2024-03-04 NOTE — Progress Notes (Signed)
 Cardiac Individual Treatment Plan  Patient Details  Name: Adrian [name] MRN: [id number] Date of Birth: November 23, 1950 Referring Provider:   Flowsheet Row INTENSIVE CARDIAC REHAB ORIENT from 12/21/2023 in [facility]  Referring Provider Redell Shallow, MD    Initial Encounter Date:  Flowsheet Row INTENSIVE CARDIAC REHAB ORIENT from 12/21/2023 in [facility], [facility]  Date 12/21/23    Visit Diagnosis: 12/04/23 DES LAD  Patient's Home Medications on Admission:  Current Outpatient Medications:    acetaminophen  (TYLENOL ) 500 MG tablet, Take 500 mg by mouth every 6 (six) hours as needed for moderate pain., Disp: , Rfl:    aspirin  EC 81 MG tablet, Take 1 tablet (81 mg total) by mouth 2 (two) times daily. To be taken after surgery (Patient taking differently: Take 81 mg by mouth daily. To be taken after surgery), Disp: 84 tablet, Rfl: 0   Cholecalciferol (VITAMIN D) 50 MCG (2000 UT) CAPS, Take 2,000 Units by mouth daily., Disp: , Rfl:    clopidogrel  (PLAVIX ) 75 MG tablet, Take 1 tablet (75 mg total) by mouth daily., Disp: 90 tablet, Rfl: 1   Coenzyme Q10 (COQ10) 100 MG CAPS, Take 100 mg by mouth daily., Disp: , Rfl:    fluticasone  (FLONASE ) 50 MCG/ACT nasal spray, Place 2 sprays into both nostrils daily as needed for allergies., Disp: , Rfl:    ketoconazole (NIZORAL) 2 % cream, Apply 1 Application topically daily as needed for irritation., Disp: , Rfl:    methocarbamol  (ROBAXIN ) 500 MG tablet, Take 500 mg by mouth every 8 (eight) hours as needed for muscle spasms., Disp: , Rfl:    nitroGLYCERIN  (NITROSTAT ) 0.4 MG SL tablet, Place 1 tablet (0.4 mg total) under the tongue every 5 (five) minutes as needed for chest pain., Disp: 25 tablet, Rfl: 3   olmesartan (BENICAR) 20 MG tablet, Take 20 mg by mouth daily., Disp: , Rfl:    rosuvastatin  (CRESTOR ) 20 MG tablet, Take 2 tablets (40 mg total) by mouth  daily., Disp: , Rfl:   Past Medical History: Past Medical History:  Diagnosis Date   Arthritis    Atypical nevus 04/20/2004   left low back (wider shave)   Basal cell carcinoma 01/16/1995   left back (cx3 31fu exc)   BCC (basal cell carcinoma of skin) 10/13/2002   left supra brow (cx3 fu exc)   BCC (basal cell carcinoma of skin) 04/20/2004   center upper back (cx3 22fu)   BCC (basal cell carcinoma of skin) 04/24/2006   upper mid back (cx3 79fu)   BCC (basal cell carcinoma of skin) 10/28/2012   left upper back (cx3 80fu)   BCC (basal cell carcinoma of skin) 10/28/2012   right back upper (cx3 66fu)   BCC (basal cell carcinoma of skin) 10/28/2012   right deltoid (cx3 52fu)   BCC (basal cell carcinoma of skin) 10/25/2015   right chest no treatment basaliod neoplasm   DJD (degenerative joint disease)    GERD (gastroesophageal reflux disease)    Hydrocele    Hypertension    Right hydrocele 2021   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   left temple (cx3 61fu)   Seasonal allergies     Tobacco Use: Social History   Tobacco Use  Smoking Status Former   Current packs/day: 0.00   Types: Cigarettes   Start date: 04/03/1972   Quit date: 04/03/1973   Years since quitting: 50.9  Smokeless Tobacco Never    Labs: Review Flowsheet       Latest Ref Rng & Units 05/18/2011 12/19/2019 12/12/2023 12/21/2023  Labs for ITP Cardiac and Pulmonary Rehab  Cholestrol 100 - 199 mg/dL - - - 888   LDL (calc) 0 - 99 mg/dL - - - 48   HDL-C >60 mg/dL - - - 52   Trlycerides 0 - 149 mg/dL - - - 43   TCO2 22 - 32 mmol/L 19  25  26   -    Capillary Blood Glucose: Lab Results  Component Value Date   GLUCAP 100 (H) 12/19/2019     Exercise Target Goals: Exercise Program Goal: Individual exercise prescription set using results from initial 6 min walk test and THRR while considering  patient's activity barriers and safety.   Exercise Prescription Goal: Initial exercise prescription builds to  30-45 minutes a day of aerobic activity, 2-3 days per week.  Home exercise guidelines will be given to patient during program as part of exercise prescription that the participant will acknowledge.  Activity Barriers & Risk Stratification:  Activity Barriers & Cardiac Risk Stratification - 12/21/23 1044       Activity Barriers & Cardiac Risk Stratification   Activity Barriers Arthritis;Joint Problems;Back Problems;Right Hip Replacement;Neck/Spine Problems;Balance Concerns    Cardiac Risk Stratification High   <5 METs on         6 Minute Walk:  6 Minute Walk     Row Name 12/21/23 1042         6 Minute Walk   Phase Initial     Distance 1000 feet     Walk Time 6 minutes     # of Rest Breaks 0     MPH 1.89     METS 2.53     RPE 9     Perceived Dyspnea  0     VO2 Peak 8.86     Symptoms Yes (comment)     Comments no pain. a little dizzy near end, pt stated felt like vertigo and was NPO for bloodwork after appt. resolved once seated pt , given H2O.     Resting HR 77 bpm     Resting BP 116/68     Resting Oxygen Saturation  98 %     Exercise Oxygen Saturation  during 6 min walk 98 %     Max Ex. HR 106 bpm     Max Ex. BP 154/64     2 Minute Post BP 138/66        Oxygen Initial Assessment:   Oxygen Re-Evaluation:   Oxygen Discharge (Final Oxygen Re-Evaluation):   Initial Exercise Prescription:  Initial Exercise Prescription - 12/21/23 1000       Date of Initial Exercise RX and Referring Provider   Date 12/21/23    Referring Provider Redell Shallow, MD    Expected Discharge Date 03/21/24      Recumbant Elliptical   Level 1    RPM 60    Watts 80    Minutes 15    METs 2.3      Track   Laps 12    Minutes 15    METs 2.3      Prescription Details   Frequency (times per week) 3    Duration Progress to 30 minutes of continuous aerobic without signs/symptoms of physical distress      Intensity   THRR 40-80% of Max Heartrate 58-117    Ratings of  Perceived Exertion  11-13    Perceived Dyspnea 0-4      Progression   Progression Continue progressive overload as per policy without signs/symptoms or physical distress.      Resistance Training   Training Prescription Yes    Weight 3    Reps 10-15          Perform Capillary Blood Glucose checks as needed.  Exercise Prescription Changes:   Exercise Prescription Changes     Row Name 01/02/24 1200 01/16/24 1100 01/25/24 1127 01/30/24 1015 02/18/24 1700     Response to Exercise   Blood Pressure (Admit) 138/60 124/58 120/74 122/66 116/56   Blood Pressure (Exercise) 164/60 142/84 -- -- --   Blood Pressure (Exit) 112/64 96/58 110/62 104/56 92/58   Heart Rate (Admit) 95 bpm 76 bpm 77 bpm 87 bpm 80 bpm   Heart Rate (Exercise) 139 bpm 104 bpm 115 bpm 121 bpm 124 bpm   Heart Rate (Exit) 104 bpm 87 bpm 86 bpm 96 bpm 89 bpm   Rating of Perceived Exertion (Exercise) 13 13 11 11  11.5   Symptoms None None None None None   Comments Pt's first day in the CRP2 program Reviewed METs Reviewed home exercise Rx Reviewed METs and goals Reviewed METs   Duration Continue with 30 min of aerobic exercise without signs/symptoms of physical distress. Continue with 30 min of aerobic exercise without signs/symptoms of physical distress. Continue with 30 min of aerobic exercise without signs/symptoms of physical distress. Continue with 30 min of aerobic exercise without signs/symptoms of physical distress. Continue with 30 min of aerobic exercise without signs/symptoms of physical distress.   Intensity THRR unchanged THRR unchanged THRR unchanged THRR unchanged THRR unchanged     Progression   Progression Continue to progress workloads to maintain intensity without signs/symptoms of physical distress. Continue to progress workloads to maintain intensity without signs/symptoms of physical distress. Continue to progress workloads to maintain intensity without signs/symptoms of physical distress. Continue to  progress workloads to maintain intensity without signs/symptoms of physical distress. Continue to progress workloads to maintain intensity without signs/symptoms of physical distress.   Average METs 3.15 2.75 2.95 3.25 3.4     Resistance Training   Training Prescription No No Yes No Yes   Weight No wts on Wednesdyas No wts on Wednesdyas 5 lbs No wts on wednesdays 5 lbs   Reps -- -- 10-15 -- 10-15   Time -- -- 5 Minutes -- 5 Minutes     Interval Training   Interval Training No No No No No     NuStep   Level -- 3 5 5 5    SPM -- 96 88 92 --   Minutes -- 15 15 15 15    METs -- 2.5 2.9 3.6 4     Recumbant Elliptical   Level 1 -- -- -- --   RPM 62 -- -- -- --   Watts 80 -- -- -- --   Minutes 15 -- -- -- --   METs 3.4 -- -- -- --     Track   Laps 15 16 16 15 14    Minutes 15 15 15 15 15    METs 2.91 3.04 3.04 2.91 2.79     Home Exercise Plan   Plans to continue exercise at -- -- Home (comment) Home (comment) Home (comment)   Frequency -- -- Add 2 additional days to program exercise sessions. Add 2 additional days to program exercise sessions. Add 2 additional days to program exercise sessions.  Initial Home Exercises Provided -- -- 01/25/24 01/25/24 01/25/24    Row Name 02/27/24 1400             Response to Exercise   Blood Pressure (Admit) 120/60       Blood Pressure (Exit) 106/62       Heart Rate (Admit) 85 bpm       Heart Rate (Exercise) 119 bpm       Heart Rate (Exit) 94 bpm       Rating of Perceived Exertion (Exercise) 11       Symptoms None       Comments Reviewed METs and goals       Duration Continue with 30 min of aerobic exercise without signs/symptoms of physical distress.       Intensity THRR unchanged         Progression   Progression Continue to progress workloads to maintain intensity without signs/symptoms of physical distress.       Average METs 3.25         Resistance Training   Training Prescription No         NuStep   Level 5       SPM 97        Minutes 15       METs 5         Track   Laps 12       Minutes 15       METs 2.53         Home Exercise Plan   Plans to continue exercise at Home (comment)       Frequency Add 2 additional days to program exercise sessions.       Initial Home Exercises Provided 01/25/24          Exercise Comments:   Exercise Comments     Row Name 01/02/24 1220 01/16/24 1121 01/25/24 1126 01/25/24 1129 01/30/24 1015   Exercise Comments Pt exercised without complaints. Pt did exceed THR on track and was told to slow pace. Will monitor and request increase in THRR if needed. Reviewed METs, Pt has imrpoved on METs up to last seeion with an average of 3.9 and peak of 4.3.  However due to significant back problems, pt voices he cannot tolerate the recumbent elliptical due to it causing him back pain the next day. Therefore, we have switched to Nustep today. Pt voices that it feels much bettter on his back. Pt will infom staff of his back pain after using the nustep. METs are noted to be lower with change to Nustep. Reviewed home exercise Rx today with patient. Pt has been walking at home, walkig his dogs and doing yard work. Pt has a elliptical and will start to use that again too. Pt will exercise 2x/week for 30 minutes. Pt verbalized understanding of the home exercise Rx and was provided a copy. Reviewed home exercise Rx with patient today. Pt will walk and use his elliptical at home. Pt will do 2-3 x/week for 30 minutes in addtion to stretching he already does. Pt verblaized understanding of the home exercise Rx and  was provided a copy. Reviewed METs and goals. Pt is making good progress. Voices feeling tired today due to poor sleep. Recently changed pt from Octance to Nustep as the Octance was exacerbating his chronic back pain. Pt voices the Nustep is much better for his back.    Row Name 02/18/24 1702 02/27/24 [phone]  Exercise Comments Reviewed METs. Pt continue to progress on METs. Reviewed METs  and goals. Pt may be reaching plateau on METs. will progress as tolerated.         Exercise Goals and Review:   Exercise Goals     Row Name 12/21/23 1049             Exercise Goals   Increase Physical Activity Yes       Intervention Provide advice, education, support and counseling about physical activity/exercise needs.;Develop an individualized exercise prescription for aerobic and resistive training based on initial evaluation findings, risk stratification, comorbidities and participant's personal goals.       Expected Outcomes Short Term: Attend rehab on a regular basis to increase amount of physical activity.;Long Term: Exercising regularly at least 3-5 days a week.;Long Term: Add in home exercise to make exercise part of routine and to increase amount of physical activity.       Increase Strength and Stamina Yes       Intervention Develop an individualized exercise prescription for aerobic and resistive training based on initial evaluation findings, risk stratification, comorbidities and participant's personal goals.;Provide advice, education, support and counseling about physical activity/exercise needs.       Expected Outcomes Long Term: Improve cardiorespiratory fitness, muscular endurance and strength as measured by increased METs and functional capacity ( );Short Term: Perform resistance training exercises routinely during rehab and add in resistance training at home;Short Term: Increase workloads from initial exercise prescription for resistance, speed, and METs.       Able to understand and use rate of perceived exertion (RPE) scale Yes       Intervention Provide education and explanation on how to use RPE scale       Expected Outcomes Short Term: Able to use RPE daily in rehab to express subjective intensity level;Long Term:  Able to use RPE to guide intensity level when exercising independently       Knowledge and understanding of Target Heart Rate Range (THRR) Yes        Intervention Provide education and explanation of THRR including how the numbers were predicted and where they are located for reference       Expected Outcomes Long Term: Able to use THRR to govern intensity when exercising independently;Short Term: Able to use daily as guideline for intensity in rehab;Short Term: Able to state/look up THRR       Understanding of Exercise Prescription Yes       Intervention Provide education, explanation, and written materials on patient's individual exercise prescription       Expected Outcomes Short Term: Able to explain program exercise prescription;Long Term: Able to explain home exercise prescription to exercise independently          Exercise Goals Re-Evaluation :  Exercise Goals Re-Evaluation     Row Name 01/02/24 1219 01/30/24 1015 02/27/24 0800         Exercise Goal Re-Evaluation   Exercise Goals Review Increase Physical Activity;Understanding of Exercise Prescription;Increase Strength and Stamina;Knowledge and understanding of Target Heart Rate Range (THRR);Able to understand and use rate of perceived exertion (RPE) scale Increase Physical Activity;Understanding of Exercise Prescription;Increase Strength and Stamina;Knowledge and understanding of Target Heart Rate Range (THRR);Able to understand and use rate of perceived exertion (RPE) scale Increase Physical Activity;Understanding of Exercise Prescription;Increase Strength and Stamina;Knowledge and understanding of Target Heart Rate Range (THRR);Able to understand and use rate of perceived exertion (RPE) scale     Comments Pt's first day in the  CRP2 program. Pt understands the exercise Rx, RPE scale and THRR. Reviewed METs and goals. Pt voices progress on his golas of increased strength and stamina. Pt voices he is back to doing many home taska which he was not doing before. Pt is also making progress on the goals of knowning his limits, and improving his knowledge about nutrition. Reviewed METs and  goals. Continues to voice progress on his goals of increased strength and stamina. Pt continues making progress on the goals of knowning his limits, and improving his knowledge about nutrition.     Expected Outcomes Will continue to monitor the patient and progress exericse workloads as toelrated. Will continue to monitor the patient and progress exericse workloads as toelrated. Will continue to monitor the patient and progress exericse workloads as toelrated.        Discharge Exercise Prescription (Final Exercise Prescription Changes):  Exercise Prescription Changes - 02/27/24 1400       Response to Exercise   Blood Pressure (Admit) 120/60    Blood Pressure (Exit) 106/62    Heart Rate (Admit) 85 bpm    Heart Rate (Exercise) 119 bpm    Heart Rate (Exit) 94 bpm    Rating of Perceived Exertion (Exercise) 11    Symptoms None    Comments Reviewed METs and goals    Duration Continue with 30 min of aerobic exercise without signs/symptoms of physical distress.    Intensity THRR unchanged      Progression   Progression Continue to progress workloads to maintain intensity without signs/symptoms of physical distress.    Average METs 3.25      Resistance Training   Training Prescription No      NuStep   Level 5    SPM 97    Minutes 15    METs 5      Track   Laps 12    Minutes 15    METs 2.53      Home Exercise Plan   Plans to continue exercise at Home (comment)    Frequency Add 2 additional days to program exercise sessions.    Initial Home Exercises Provided 01/25/24          Nutrition:  Target Goals: Understanding of nutrition guidelines, daily intake of sodium 1500mg , cholesterol 200mg , calories 30% from fat and 7% or less from saturated fats, daily to have 5 or more servings of fruits and vegetables.  Biometrics:  Pre Biometrics - 12/21/23 0922       Pre Biometrics   Waist Circumference 42.5 inches    Hip Circumference 41 inches    Waist to Hip Ratio 1.04 %     Triceps Skinfold 15 mm    % Body Fat 28.2 %    Grip Strength 23 kg    Flexibility --   not done- back issues   Single Leg Stand 0.2 seconds           Nutrition Therapy Plan and Nutrition Goals:   Nutrition Assessments:  MEDIFICTS Score Key: >=70 Need to make dietary changes  40-70 Heart Healthy Diet <= 40 Therapeutic Level Cholesterol Diet   Flowsheet Row INTENSIVE CARDIAC REHAB from 01/16/2024 in [facility]  Picture Your Plate Total Score on Admission 73   Picture Your Plate Scores: <59 Unhealthy dietary pattern with much room for improvement. 41-50 Dietary pattern unlikely to meet recommendations for good health and room for improvement. 51-60 More healthful dietary pattern, with some  room for improvement.  >60 Healthy dietary pattern, although there may be some specific behaviors that could be improved.    Nutrition Goals Re-Evaluation:   Nutrition Goals Re-Evaluation:   Nutrition Goals Discharge (Final Nutrition Goals Re-Evaluation):   Psychosocial: Target Goals: Acknowledge presence or absence of significant depression and/or stress, maximize coping skills, provide positive support system. Participant is able to verbalize types and ability to use techniques and skills needed for reducing stress and depression.  Initial Review & Psychosocial Screening:  Initial Psych Review & Screening - 12/21/23 1050       Initial Review   Current issues with Current Stress Concerns    Source of Stress Concerns Family;Financial    Comments Robyn shared that he has some stress relating to financially supporting his son through a divorce along with other family issues. He denies any depression/anxiety and has a good outlook and positive mindset. Support offered, denies need for additional resources at this time.      Family Dynamics   Good Support System? Yes   wife     Barriers   Psychosocial barriers to  participate in program The patient should benefit from training in stress management and relaxation.      Screening Interventions   Interventions Provide feedback about the scores to participant;Encouraged to exercise;To provide support and resources with identified psychosocial needs    Expected Outcomes Long Term goal: The participant improves quality of Life and PHQ9 Scores as seen by post scores and/or verbalization of changes;Short Term goal: Identification and review with participant of any Quality of Life or Depression concerns found by scoring the questionnaire.;Long Term Goal: Stressors or current issues are controlled or eliminated.          Quality of Life Scores:  Quality of Life - 12/21/23 1052       Quality of Life   Select Quality of Life      Quality of Life Scores   Health/Function Pre 21.68 %    Socioeconomic Pre 27.3 %    Psych/Spiritual Pre 24.92 %    Family Pre 23.7 %    GLOBAL Pre 23.6 %         Scores of 19 and below usually indicate a poorer quality of life in these areas.  A difference of  2-3 points is a clinically meaningful difference.  A difference of 2-3 points in the total score of the Quality of Life Index has been associated with significant improvement in overall quality of life, self-image, physical symptoms, and general health in studies assessing change in quality of life.  PHQ-9: Review Flowsheet       12/21/2023  Depression screen PHQ 2/9  Decreased Interest 0  Down, Depressed, Hopeless 1  PHQ - 2 Score 1  Altered sleeping 0  Tired, decreased energy 0  Change in appetite 1  Feeling bad or failure about yourself  0  Trouble concentrating 1  Moving slowly or fidgety/restless 0  Suicidal thoughts 0  PHQ-9 Score 3   Difficult doing work/chores Not difficult at all    Details       Data saved with a previous flowsheet row definition        Interpretation of Total Score  Total Score Depression Severity:  1-4 = Minimal  depression, 5-9 = Mild depression, 10-14 = Moderate depression, 15-19 = Moderately severe depression, 20-27 = Severe depression   Psychosocial Evaluation and Intervention:   Psychosocial Re-Evaluation:  Psychosocial Re-Evaluation     Row Name  01/08/24 1522 01/31/24 1208 03/04/24 1200         Psychosocial Re-Evaluation   Current issues with Current Stress Concerns Current Stress Concerns Current Stress Concerns     Comments Brent has not voiced any increased concerns or stressors during exercise at cardiac rehab. Will discuss PHQ9 in the upcoming future, Discussed PHQ9. Jody says he has had a lot going on. Mishon says his son in the [employer] is going through a divorce. Demarea says he had been overeating. Ikechukwu has made some dietary changes and is making healthier food choices. Judson says he is eating more vegetables and has changed to drinking skim milk.  Heaven says he feels better since he has started cardiac rehab Suleiman has not voiced any increased concerns or stressors during exercise at cardiac rehab.     Expected Outcomes Tyshun will have controlled or decreased concerns or stressors during exercise at cardiac rehab Rein will have controlled or decreased concerns or stressors during exercise at cardiac rehab Cristal will have controlled or decreased concerns or stressors during exercise at cardiac rehab     Interventions Stress management education;Relaxation education;Encouraged to attend Cardiac Rehabilitation for the exercise Stress management education;Relaxation education;Encouraged to attend Cardiac Rehabilitation for the exercise Stress management education;Relaxation education;Encouraged to attend Cardiac Rehabilitation for the exercise     Continue Psychosocial Services  Follow up required by staff No Follow up required Follow up required by staff       Initial Review   Source of Stress Concerns Family Family Family     Comments Will continue to monitor and offer support as needed.  Will continue to monitor and offer support as needed. Will continue to monitor and offer support as needed.        Psychosocial Discharge (Final Psychosocial Re-Evaluation):  Psychosocial Re-Evaluation - 03/04/24 1200       Psychosocial Re-Evaluation   Current issues with Current Stress Concerns    Comments Ruger has not voiced any increased concerns or stressors during exercise at cardiac rehab.    Expected Outcomes Milt will have controlled or decreased concerns or stressors during exercise at cardiac rehab    Interventions Stress management education;Relaxation education;Encouraged to attend Cardiac Rehabilitation for the exercise    Continue Psychosocial Services  Follow up required by staff      Initial Review   Source of Stress Concerns Family    Comments Will continue to monitor and offer support as needed.          Vocational Rehabilitation: Provide vocational rehab assistance to qualifying candidates.   Vocational Rehab Evaluation & Intervention:  Vocational Rehab - 12/21/23 1055       Initial Vocational Rehab Evaluation & Intervention   Assessment shows need for Vocational Rehabilitation No   retired         Education: Education Goals: Education classes will be provided on a weekly basis, covering required topics. Participant will state understanding/return demonstration of topics presented.    Education     Row Name 12/21/23 0900     Education   Cardiac Education Topics Pritikin   Select Core Videos     Core Videos   Educator Dietitian   Select Nutrition   Nutrition Vitamins and Minerals   Instruction Review Code 1- Verbalizes Understanding   Class Start Time 0815   Class Stop Time 0855   Class Time Calculation (min) 40 min    Row Name 01/02/24 0800     Education   Cardiac Education Topics  Pritikin   Set Designer Nurse;Respiratory Therapist   Weekly Topic Simple Sides and Sauces   Instruction Review  Code 1- Verbalizes Understanding   Class Start Time [phone]   Class Stop Time 0854   Class Time Calculation (min) 43 min    Row Name 01/04/24 0900     Education   Cardiac Education Topics Pritikin   Select Core Videos     Core Videos   Educator Exercise Physiologist   Select Psychosocial   Psychosocial How Our Thoughts Can Heal Our Hearts   Instruction Review Code 1- Verbalizes Understanding   Class Start Time 0815   Class Stop Time 0900   Class Time Calculation (min) 45 min    Row Name 01/07/24 0900     Education   Cardiac Education Topics Pritikin   Psychologist, Forensic General Education   General Education Heart Disease Risk Reduction   Instruction Review Code 1- Verbalizes Understanding   Class Start Time 0818   Class Stop Time 0853   Class Time Calculation (min) 35 min    Row Name 01/09/24 0800     Education   Cardiac Education Topics Pritikin   Secondary School Teacher School   Educator Nurse   Weekly Topic Powerhouse Plant-Based Proteins   Instruction Review Code 1- Verbalizes Understanding   Class Start Time 0815   Class Stop Time 0848   Class Time Calculation (min) 33 min    Row Name 01/11/24 0800     Education   Cardiac Education Topics Pritikin   Select Core Videos     Core Videos   Educator Dietitian   Select Nutrition   Nutrition Facts on Fat   Instruction Review Code 1- Verbalizes Understanding   Class Start Time [phone]   Class Stop Time 0853   Class Time Calculation (min) 37 min    Row Name 01/14/24 0800     Education   Cardiac Education Topics Pritikin   Geographical Information Systems Officer Psychosocial   Psychosocial Workshop From Head to Heart: The Power of a Healthy Outlook   Instruction Review Code 1- Verbalizes Understanding   Class Start Time [phone]   Class Stop Time 0857   Class Time Calculation (min) 43 min    Row Name  01/16/24 0800     Education   Cardiac Education Topics Pritikin   Secondary School Teacher School   Educator Nurse;Respiratory Therapist   Weekly Topic Tasty Appetizers and Snacks   Instruction Review Code 1- Verbalizes Understanding    Row Name 01/16/24 0900     Education   Cardiac Education Topics Pritikin   Secondary School Teacher School   Educator Nurse;Respiratory Therapist   Weekly Topic Tasty Appetizers and Snacks   Instruction Review Code 1- Verbalizes Understanding   Class Start Time 0815   Class Stop Time 0846   Class Time Calculation (min) 31 min    Row Name 01/18/24 0800     Education   Cardiac Education Topics Pritikin   Select Workshops     Workshops   Educator Exercise Physiologist   Select Exercise   Exercise Workshop Managing Heart Disease: Your Path to a Healthier Heart   Instruction Review Code 1- Verbalizes Understanding  Class Start Time 0819   Class Stop Time 0900   Class Time Calculation (min) 41 min    Row Name 01/21/24 0800     Education   Cardiac Education Topics Pritikin   Select Core Videos     Core Videos   Educator Exercise Physiologist   Select Psychosocial   Psychosocial Healthy Minds, Bodies, Hearts   Instruction Review Code 1- Verbalizes Understanding   Class Start Time 0815   Class Stop Time 0847   Class Time Calculation (min) 32 min    Row Name 01/23/24 0800     Education   Cardiac Education Topics Pritikin   Secondary School Teacher School   Educator Nurse   Weekly Topic Adding Flavor - Sodium-Free   Instruction Review Code 1- Verbalizes Understanding   Class Start Time 0815   Class Stop Time 0847   Class Time Calculation (min) 32 min    Row Name 01/25/24 0800     Education   Cardiac Education Topics Pritikin   Glass Blower/designer Nutrition   Nutrition Workshop Label Reading   Instruction Review Code 1- Verbalizes Understanding    Class Start Time 0815   Class Stop Time [phone]   Class Time Calculation (min) 37 min    Row Name 01/28/24 0800     Education   Cardiac Education Topics Pritikin   Select Workshops     Workshops   Educator Exercise Physiologist   Select Exercise   Exercise Workshop Location Manager and Fall Prevention   Instruction Review Code 1- Verbalizes Understanding   Class Start Time 0815   Class Stop Time 0850   Class Time Calculation (min) 35 min    Row Name 01/30/24 0900     Education   Cardiac Education Topics Pritikin   Secondary School Teacher School   Educator Respiratory Therapist;Nurse   Weekly Topic Fast and Healthy Breakfasts   Instruction Review Code 1- Verbalizes Understanding   Class Start Time 0815   Class Stop Time [id number]   Class Time Calculation (min) 37 min    Row Name 02/06/24 0800     Education   Cardiac Education Topics Pritikin   Secondary School Teacher School   Educator Dietitian;Respiratory Therapist   Weekly Topic Personalizing Your Pritikin Plate   Instruction Review Code 1- Verbalizes Understanding   Class Start Time [phone]   Class Stop Time 0851   Class Time Calculation (min) 39 min    Row Name 02/08/24 0800     Education   Cardiac Education Topics Pritikin   Select Core Videos     Core Videos   Educator Dietitian   Select Nutrition   Nutrition Overview of the Pritikin Eating Plan   Instruction Review Code 1- Verbalizes Understanding   Class Start Time 0815   Class Stop Time 0855   Class Time Calculation (min) 40 min    Row Name 02/11/24 0800     Education   Cardiac Education Topics Pritikin   Select Workshops     Workshops   Educator Exercise Physiologist   Select Psychosocial   Psychosocial Workshop Recognizing and Reducing Stress   Instruction Review Code 1- Verbalizes Understanding   Class Start Time [phone]   Class Stop Time 0900   Class Time Calculation (min) 48 min    Row Name 02/13/24 0800  Education    Cardiac Education Topics Pritikin   Orthoptist   Educator Dietitian   Weekly Topic Delicious Desserts   Instruction Review Code 1- Verbalizes Understanding   Class Start Time 0815   Class Stop Time 0857   Class Time Calculation (min) 42 min    Row Name 02/15/24 0800     Education   Cardiac Education Topics Pritikin   Select Core Videos     Core Videos   Educator Dietitian   Select Nutrition   Nutrition Calorie Density   Instruction Review Code 1- Verbalizes Understanding   Class Start Time [phone]   Class Stop Time 0848   Class Time Calculation (min) 32 min    Row Name 02/18/24 0800     Education   Cardiac Education Topics Pritikin   Select Core Videos     Core Videos   Educator Exercise Physiologist   Select Exercise Education   Exercise Education Move It!   Instruction Review Code 1- Verbalizes Understanding   Class Start Time 0815   Class Stop Time 0854   Class Time Calculation (min) 39 min    Row Name 02/22/24 0800     Education   Cardiac Education Topics Pritikin   Select Workshops     Workshops   Educator Exercise Physiologist   Select Exercise   Exercise Workshop Exercise Basics: Building Your Action Plan   Nutrition Workshop --   Instruction Review Code 1- Verbalizes Understanding   Class Start Time 0815   Class Stop Time 0851   Class Time Calculation (min) 36 min    Row Name 02/25/24 0800     Education   Cardiac Education Topics Pritikin   Glass Blower/designer Nutrition   Nutrition Workshop Targeting Your Nutrition Priorities   Instruction Review Code 1- Verbalizes Understanding   Class Start Time 0815   Class Stop Time 0901   Class Time Calculation (min) 46 min    Row Name 02/27/24 0800     Education   Cardiac Education Topics Pritikin   Secondary School Teacher School   Educator Dietitian   Weekly Topic One-Pot Wonders   Instruction Review Code 1-  Verbalizes Understanding   Class Start Time [phone]   Class Stop Time 0858   Class Time Calculation (min) 44 min    Row Name 03/03/24 0800     Education   Cardiac Education Topics Pritikin   Select Workshops     Workshops   Educator Exercise Physiologist   Select Psychosocial   Psychosocial Workshop Focused Goals, Sustainable Changes   Instruction Review Code 1- Verbalizes Understanding   Class Start Time 0815   Class Stop Time 0850   Class Time Calculation (min) 35 min      Core Videos: Exercise    Move It!  Clinical staff conducted group or individual video education with verbal and written material and guidebook.  Patient learns the recommended Pritikin exercise program. Exercise with the goal of living a long, healthy life. Some of the health benefits of exercise include controlled diabetes, healthier blood pressure levels, improved cholesterol levels, improved heart and lung capacity, improved sleep, and better body composition. Everyone should speak with their doctor before starting or changing an exercise routine.  Biomechanical Limitations Clinical staff conducted group or individual video education with verbal and written material and guidebook.  Patient learns how  biomechanical limitations can impact exercise and how we can mitigate and possibly overcome limitations to have an impactful and balanced exercise routine.  Body Composition Clinical staff conducted group or individual video education with verbal and written material and guidebook.  Patient learns that body composition (ratio of muscle mass to fat mass) is a key component to assessing overall fitness, rather than body weight alone. Increased fat mass, especially visceral belly fat, can put us  at increased risk for metabolic syndrome, type 2 diabetes, heart disease, and even death. It is recommended to combine diet and exercise (cardiovascular and resistance training) to improve your body composition. Seek guidance  from your physician and exercise physiologist before implementing an exercise routine.  Exercise Action Plan Clinical staff conducted group or individual video education with verbal and written material and guidebook.  Patient learns the recommended strategies to achieve and enjoy long-term exercise adherence, including variety, self-motivation, self-efficacy, and positive decision making. Benefits of exercise include fitness, good health, weight management, more energy, better sleep, less stress, and overall well-being.  Medical   Heart Disease Risk Reduction Clinical staff conducted group or individual video education with verbal and written material and guidebook.  Patient learns our heart is our most vital organ as it circulates oxygen, nutrients, white blood cells, and hormones throughout the entire body, and carries waste away. Data supports a plant-based eating plan like the Pritikin Program for its effectiveness in slowing progression of and reversing heart disease. The video provides a number of recommendations to address heart disease.   Metabolic Syndrome and Belly Fat  Clinical staff conducted group or individual video education with verbal and written material and guidebook.  Patient learns what metabolic syndrome is, how it leads to heart disease, and how one can reverse it and keep it from coming back. You have metabolic syndrome if you have 3 of the following 5 criteria: abdominal obesity, high blood pressure, high triglycerides, low HDL cholesterol, and high blood sugar.  Hypertension and Heart Disease Clinical staff conducted group or individual video education with verbal and written material and guidebook.  Patient learns that high blood pressure, or hypertension, is very common in the [place] . Hypertension is largely due to excessive salt intake, but other important risk factors include being overweight, physical inactivity, drinking too much alcohol , smoking, and not  eating enough potassium from fruits and vegetables. High blood pressure is a leading risk factor for heart attack, stroke, congestive heart failure, dementia, kidney failure, and premature death. Long-term effects of excessive salt intake include stiffening of the arteries and thickening of heart muscle and organ damage. Recommendations include ways to reduce hypertension and the risk of heart disease.  Diseases of Our Time - Focusing on Diabetes Clinical staff conducted group or individual video education with verbal and written material and guidebook.  Patient learns why the best way to stop diseases of our time is prevention, through food and other lifestyle changes. Medicine (such as prescription pills and surgeries) is often only a Band-Aid on the problem, not a long-term solution. Most common diseases of our time include obesity, type 2 diabetes, hypertension, heart disease, and cancer. The Pritikin Program is recommended and has been proven to help reduce, reverse, and/or prevent the damaging effects of metabolic syndrome.  Nutrition   Overview of the Pritikin Eating Plan  Clinical staff conducted group or individual video education with verbal and written material and guidebook.  Patient learns about the Pritikin Eating Plan for disease risk reduction. The Pritikin Eating  Plan emphasizes a wide variety of unrefined, minimally-processed carbohydrates, like fruits, vegetables, whole grains, and legumes. Go, Caution, and Stop food choices are explained. Plant-based and lean animal proteins are emphasized. Rationale provided for low sodium intake for blood pressure control, low added sugars for blood sugar stabilization, and low added fats and oils for coronary artery disease risk reduction and weight management.  Calorie Density  Clinical staff conducted group or individual video education with verbal and written material and guidebook.  Patient learns about calorie density and how it impacts the  Pritikin Eating Plan. Knowing the characteristics of the food you choose will help you decide whether those foods will lead to weight gain or weight loss, and whether you want to consume more or less of them. Weight loss is usually a side effect of the Pritikin Eating Plan because of its focus on low calorie-dense foods.  Label Reading  Clinical staff conducted group or individual video education with verbal and written material and guidebook.  Patient learns about the Pritikin recommended label reading guidelines and corresponding recommendations regarding calorie density, added sugars, sodium content, and whole grains.  Dining Out - Part 1  Clinical staff conducted group or individual video education with verbal and written material and guidebook.  Patient learns that restaurant meals can be sabotaging because they can be so high in calories, fat, sodium, and/or sugar. Patient learns recommended strategies on how to positively address this and avoid unhealthy pitfalls.  Facts on Fats  Clinical staff conducted group or individual video education with verbal and written material and guidebook.  Patient learns that lifestyle modifications can be just as effective, if not more so, as many medications for lowering your risk of heart disease. A Pritikin lifestyle can help to reduce your risk of inflammation and atherosclerosis (cholesterol build-up, or plaque, in the artery walls). Lifestyle interventions such as dietary choices and physical activity address the cause of atherosclerosis. A review of the types of fats and their impact on blood cholesterol levels, along with dietary recommendations to reduce fat intake is also included.  Nutrition Action Plan  Clinical staff conducted group or individual video education with verbal and written material and guidebook.  Patient learns how to incorporate Pritikin recommendations into their lifestyle. Recommendations include planning and keeping personal  health goals in mind as an important part of their success.  Healthy Mind-Set    Healthy Minds, Bodies, Hearts  Clinical staff conducted group or individual video education with verbal and written material and guidebook.  Patient learns how to identify when they are stressed. Video will discuss the impact of that stress, as well as the many benefits of stress management. Patient will also be introduced to stress management techniques. The way we think, act, and feel has an impact on our hearts.  How Our Thoughts Can Heal Our Hearts  Clinical staff conducted group or individual video education with verbal and written material and guidebook.  Patient learns that negative thoughts can cause depression and anxiety. This can result in negative lifestyle behavior and serious health problems. Cognitive behavioral therapy is an effective method to help control our thoughts in order to change and improve our emotional outlook.  Additional Videos:  Exercise    Improving Performance  Clinical staff conducted group or individual video education with verbal and written material and guidebook.  Patient learns to use a non-linear approach by alternating intensity levels and lengths of time spent exercising to help burn more calories and lose more body fat.  Cardiovascular exercise helps improve heart health, metabolism, hormonal balance, blood sugar control, and recovery from fatigue. Resistance training improves strength, endurance, balance, coordination, reaction time, metabolism, and muscle mass. Flexibility exercise improves circulation, posture, and balance. Seek guidance from your physician and exercise physiologist before implementing an exercise routine and learn your capabilities and proper form for all exercise.  Introduction to Yoga  Clinical staff conducted group or individual video education with verbal and written material and guidebook.  Patient learns about yoga, a discipline of the coming  together of mind, breath, and body. The benefits of yoga include improved flexibility, improved range of motion, better posture and core strength, increased lung function, weight loss, and positive self-image. Yoga's heart health benefits include lowered blood pressure, healthier heart rate, decreased cholesterol and triglyceride levels, improved immune function, and reduced stress. Seek guidance from your physician and exercise physiologist before implementing an exercise routine and learn your capabilities and proper form for all exercise.  Medical   Aging: Enhancing Your Quality of Life  Clinical staff conducted group or individual video education with verbal and written material and guidebook.  Patient learns key strategies and recommendations to stay in good physical health and enhance quality of life, such as prevention strategies, having an advocate, securing a Health Care Proxy and Power of Attorney, and keeping a list of medications and system for tracking them. It also discusses how to avoid risk for bone loss.  Biology of Weight Control  Clinical staff conducted group or individual video education with verbal and written material and guidebook.  Patient learns that weight gain occurs because we consume more calories than we burn (eating more, moving less). Even if your body weight is normal, you may have higher ratios of fat compared to muscle mass. Too much body fat puts you at increased risk for cardiovascular disease, heart attack, stroke, type 2 diabetes, and obesity-related cancers. In addition to exercise, following the Pritikin Eating Plan can help reduce your risk.  Decoding Lab Results  Clinical staff conducted group or individual video education with verbal and written material and guidebook.  Patient learns that lab test reflects one measurement whose values change over time and are influenced by many factors, including medication, stress, sleep, exercise, food, hydration,  pre-existing medical conditions, and more. It is recommended to use the knowledge from this video to become more involved with your lab results and evaluate your numbers to speak with your doctor.   Diseases of Our Time - Overview  Clinical staff conducted group or individual video education with verbal and written material and guidebook.  Patient learns that according to the CDC, 50% to 70% of chronic diseases (such as obesity, type 2 diabetes, elevated lipids, hypertension, and heart disease) are avoidable through lifestyle improvements including healthier food choices, listening to satiety cues, and increased physical activity.  Sleep Disorders Clinical staff conducted group or individual video education with verbal and written material and guidebook.  Patient learns how good quality and duration of sleep are important to overall health and well-being. Patient also learns about sleep disorders and how they impact health along with recommendations to address them, including discussing with a physician.  Nutrition  Dining Out - Part 2 Clinical staff conducted group or individual video education with verbal and written material and guidebook.  Patient learns how to plan ahead and communicate in order to maximize their dining experience in a healthy and nutritious manner. Included are recommended food choices based on the type of restaurant the  patient is visiting.   Fueling a Banker conducted group or individual video education with verbal and written material and guidebook.  There is a strong connection between our food choices and our health. Diseases like obesity and type 2 diabetes are very prevalent and are in large-part due to lifestyle choices. The Pritikin Eating Plan provides plenty of food and hunger-curbing satisfaction. It is easy to follow, affordable, and helps reduce health risks.  Menu Workshop  Clinical staff conducted group or individual video education  with verbal and written material and guidebook.  Patient learns that restaurant meals can sabotage health goals because they are often packed with calories, fat, sodium, and sugar. Recommendations include strategies to plan ahead and to communicate with the manager, chef, or server to help order a healthier meal.  Planning Your Eating Strategy  Clinical staff conducted group or individual video education with verbal and written material and guidebook.  Patient learns about the Pritikin Eating Plan and its benefit of reducing the risk of disease. The Pritikin Eating Plan does not focus on calories. Instead, it emphasizes high-quality, nutrient-rich foods. By knowing the characteristics of the foods, we choose, we can determine their calorie density and make informed decisions.  Targeting Your Nutrition Priorities  Clinical staff conducted group or individual video education with verbal and written material and guidebook.  Patient learns that lifestyle habits have a tremendous impact on disease risk and progression. This video provides eating and physical activity recommendations based on your personal health goals, such as reducing LDL cholesterol, losing weight, preventing or controlling type 2 diabetes, and reducing high blood pressure.  Vitamins and Minerals  Clinical staff conducted group or individual video education with verbal and written material and guidebook.  Patient learns different ways to obtain key vitamins and minerals, including through a recommended healthy diet. It is important to discuss all supplements you take with your doctor.   Healthy Mind-Set    Smoking Cessation  Clinical staff conducted group or individual video education with verbal and written material and guidebook.  Patient learns that cigarette smoking and tobacco addiction pose a serious health risk which affects millions of people. Stopping smoking will significantly reduce the risk of heart disease, lung disease,  and many forms of cancer. Recommended strategies for quitting are covered, including working with your doctor to develop a successful plan.  Culinary   Becoming a Set Designer conducted group or individual video education with verbal and written material and guidebook.  Patient learns that cooking at home can be healthy, cost-effective, quick, and puts them in control. Keys to cooking healthy recipes will include looking at your recipe, assessing your equipment needs, planning ahead, making it simple, choosing cost-effective seasonal ingredients, and limiting the use of added fats, salts, and sugars.  Cooking - Breakfast and Snacks  Clinical staff conducted group or individual video education with verbal and written material and guidebook.  Patient learns how important breakfast is to satiety and nutrition through the entire day. Recommendations include key foods to eat during breakfast to help stabilize blood sugar levels and to prevent overeating at meals later in the day. Planning ahead is also a key component.  Cooking - Educational Psychologist conducted group or individual video education with verbal and written material and guidebook.  Patient learns eating strategies to improve overall health, including an approach to cook more at home. Recommendations include thinking of animal protein as a side on your  plate rather than center stage and focusing instead on lower calorie dense options like vegetables, fruits, whole grains, and plant-based proteins, such as beans. Making sauces in large quantities to freeze for later and leaving the skin on your vegetables are also recommended to maximize your experience.  Cooking - Healthy Salads and Dressing Clinical staff conducted group or individual video education with verbal and written material and guidebook.  Patient learns that vegetables, fruits, whole grains, and legumes are the foundations of the Pritikin Eating Plan.  Recommendations include how to incorporate each of these in flavorful and healthy salads, and how to create homemade salad dressings. Proper handling of ingredients is also covered. Cooking - Soups and [employer] - Soups and Desserts Clinical staff conducted group or individual video education with verbal and written material and guidebook.  Patient learns that Pritikin soups and desserts make for easy, nutritious, and delicious snacks and meal components that are low in sodium, fat, sugar, and calorie density, while high in vitamins, minerals, and filling fiber. Recommendations include simple and healthy ideas for soups and desserts.   Overview     The Pritikin Solution Program Overview Clinical staff conducted group or individual video education with verbal and written material and guidebook.  Patient learns that the results of the Pritikin Program have been documented in more than 100 articles published in peer-reviewed journals, and the benefits include reducing risk factors for (and, in some cases, even reversing) high cholesterol, high blood pressure, type 2 diabetes, obesity, and more! An overview of the three key pillars of the Pritikin Program will be covered: eating well, doing regular exercise, and having a healthy mind-set.  WORKSHOPS  Exercise: Exercise Basics: Building Your Action Plan Clinical staff led group instruction and group discussion with PowerPoint presentation and patient guidebook. To enhance the learning environment the use of posters, models and videos may be added. At the conclusion of this workshop, patients will comprehend the difference between physical activity and exercise, as well as the benefits of incorporating both, into their routine. Patients will understand the FITT (Frequency, Intensity, Time, and Type) principle and how to use it to build an exercise action plan. In addition, safety concerns and other considerations for exercise and cardiac rehab  will be addressed by the presenter. The purpose of this lesson is to promote a comprehensive and effective weekly exercise routine in order to improve patients' overall level of fitness.   Managing Heart Disease: Your Path to a Healthier Heart Clinical staff led group instruction and group discussion with PowerPoint presentation and patient guidebook. To enhance the learning environment the use of posters, models and videos may be added.At the conclusion of this workshop, patients will understand the anatomy and physiology of the heart. Additionally, they will understand how Pritikin's three pillars impact the risk factors, the progression, and the management of heart disease.  The purpose of this lesson is to provide a high-level overview of the heart, heart disease, and how the Pritikin lifestyle positively impacts risk factors.  Exercise Biomechanics Clinical staff led group instruction and group discussion with PowerPoint presentation and patient guidebook. To enhance the learning environment the use of posters, models and videos may be added. Patients will learn how the structural parts of their bodies function and how these functions impact their daily activities, movement, and exercise. Patients will learn how to promote a neutral spine, learn how to manage pain, and identify ways to improve their physical movement in order to promote healthy living. The  purpose of this lesson is to expose patients to common physical limitations that impact physical activity. Participants will learn practical ways to adapt and manage aches and pains, and to minimize their effect on regular exercise. Patients will learn how to maintain good posture while sitting, walking, and lifting.  Balance Training and Fall Prevention  Clinical staff led group instruction and group discussion with PowerPoint presentation and patient guidebook. To enhance the learning environment the use of posters, models and videos  may be added. At the conclusion of this workshop, patients will understand the importance of their sensorimotor skills (vision, proprioception, and the vestibular system) in maintaining their ability to balance as they age. Patients will apply a variety of balancing exercises that are appropriate for their current level of function. Patients will understand the common causes for poor balance, possible solutions to these problems, and ways to modify their physical environment in order to minimize their fall risk. The purpose of this lesson is to teach patients about the importance of maintaining balance as they age and ways to minimize their risk of falling.  WORKSHOPS   Nutrition:  Fueling a Ship Broker led group instruction and group discussion with PowerPoint presentation and patient guidebook. To enhance the learning environment the use of posters, models and videos may be added. Patients will review the foundational principles of the Pritikin Eating Plan and understand what constitutes a serving size in each of the food groups. Patients will also learn Pritikin-friendly foods that are better choices when away from home and review make-ahead meal and snack options. Calorie density will be reviewed and applied to three nutrition priorities: weight maintenance, weight loss, and weight gain. The purpose of this lesson is to reinforce (in a group setting) the key concepts around what patients are recommended to eat and how to apply these guidelines when away from home by planning and selecting Pritikin-friendly options. Patients will understand how calorie density may be adjusted for different weight management goals.  Mindful Eating  Clinical staff led group instruction and group discussion with PowerPoint presentation and patient guidebook. To enhance the learning environment the use of posters, models and videos may be added. Patients will briefly review the concepts of the Pritikin  Eating Plan and the importance of low-calorie dense foods. The concept of mindful eating will be introduced as well as the importance of paying attention to internal hunger signals. Triggers for non-hunger eating and techniques for dealing with triggers will be explored. The purpose of this lesson is to provide patients with the opportunity to review the basic principles of the Pritikin Eating Plan, discuss the value of eating mindfully and how to measure internal cues of hunger and fullness using the Hunger Scale. Patients will also discuss reasons for non-hunger eating and learn strategies to use for controlling emotional eating.  Targeting Your Nutrition Priorities Clinical staff led group instruction and group discussion with PowerPoint presentation and patient guidebook. To enhance the learning environment the use of posters, models and videos may be added. Patients will learn how to determine their genetic susceptibility to disease by reviewing their family history. Patients will gain insight into the importance of diet as part of an overall healthy lifestyle in mitigating the impact of genetics and other environmental insults. The purpose of this lesson is to provide patients with the opportunity to assess their personal nutrition priorities by looking at their family history, their own health history and current risk factors. Patients will also be able to discuss  ways of prioritizing and modifying the Pritikin Eating Plan for their highest risk areas  Menu  Clinical staff led group instruction and group discussion with PowerPoint presentation and patient guidebook. To enhance the learning environment the use of posters, models and videos may be added. Using menus brought in from [employer], or printed from [employer], patients will apply the Pritikin dining out guidelines that were presented in the [employer] video. Patients will also be able to practice these guidelines  in a variety of provided scenarios. The purpose of this lesson is to provide patients with the opportunity to practice hands-on learning of the Pritikin Dining Out guidelines with actual menus and practice scenarios.  Label Reading Clinical staff led group instruction and group discussion with PowerPoint presentation and patient guidebook. To enhance the learning environment the use of posters, models and videos may be added. Patients will review and discuss the Pritikin label reading guidelines presented in Pritikin's Label Reading Educational series video. Using fool labels brought in from local grocery stores and markets, patients will apply the label reading guidelines and determine if the packaged food meet the Pritikin guidelines. The purpose of this lesson is to provide patients with the opportunity to review, discuss, and practice hands-on learning of the Pritikin Label Reading guidelines with actual packaged food labels. Cooking School  Pritikin's [employer] are designed to teach patients ways to prepare quick, simple, and affordable recipes at home. The importance of nutrition's role in chronic disease risk reduction is reflected in its emphasis in the overall Pritikin program. By learning how to prepare essential core Pritikin Eating Plan recipes, patients will increase control over what they eat; be able to customize the flavor of foods without the use of added salt, sugar, or fat; and improve the quality of the food they consume. By learning a set of core recipes which are easily assembled, quickly prepared, and affordable, patients are more likely to prepare more healthy foods at home. These workshops focus on convenient breakfasts, simple entres, side dishes, and desserts which can be prepared with minimal effort and are consistent with nutrition recommendations for cardiovascular risk reduction. Cooking [employer] are taught by a armed forces logistics/support/administrative officer (RD) who has  been trained by the [employer]. The chef or RD has a clear understanding of the importance of minimizing - if not completely eliminating - added fat, sugar, and sodium in recipes. Throughout the series of Cooking School Workshop sessions, patients will learn about healthy ingredients and efficient methods of cooking to build confidence in their capability to prepare    Cooking School weekly topics:  Adding Flavor- Sodium-Free  Fast and Healthy Breakfasts  Powerhouse Plant-Based Proteins  Satisfying Salads and Dressings  Simple Sides and Sauces  International Cuisine-Spotlight on the [employer] Zones  Delicious Desserts  Savory Soups  [employer] - Meals in a Astronomer Appetizers and Snacks  Comforting Weekend Breakfasts  One-Pot Wonders   Fast Evening Meals  Landscape Architect Your Pritikin Plate  WORKSHOPS   Healthy Mindset (Psychosocial):  Focused Goals, Sustainable Changes Clinical staff led group instruction and group discussion with PowerPoint presentation and patient guidebook. To enhance the learning environment the use of posters, models and videos may be added. Patients will be able to apply effective goal setting strategies to establish at least one personal goal, and then take consistent, meaningful action toward that goal. They will learn to identify common barriers to achieving personal goals  and develop strategies to overcome them. Patients will also gain an understanding of how our mind-set can impact our ability to achieve goals and the importance of cultivating a positive and growth-oriented mind-set. The purpose of this lesson is to provide patients with a deeper understanding of how to set and achieve personal goals, as well as the tools and strategies needed to overcome common obstacles which may arise along the way.  From Head to Heart: The Power of a Healthy Outlook  Clinical staff led group instruction and group discussion with  PowerPoint presentation and patient guidebook. To enhance the learning environment the use of posters, models and videos may be added. Patients will be able to recognize and describe the impact of emotions and mood on physical health. They will discover the importance of self-care and explore self-care practices which may work for them. Patients will also learn how to utilize the 4 C's to cultivate a healthier outlook and better manage stress and challenges. The purpose of this lesson is to demonstrate to patients how a healthy outlook is an essential part of maintaining good health, especially as they continue their cardiac rehab journey.  Healthy Sleep for a Healthy Heart Clinical staff led group instruction and group discussion with PowerPoint presentation and patient guidebook. To enhance the learning environment the use of posters, models and videos may be added. At the conclusion of this workshop, patients will be able to demonstrate knowledge of the importance of sleep to overall health, well-being, and quality of life. They will understand the symptoms of, and treatments for, common sleep disorders. Patients will also be able to identify daytime and nighttime behaviors which impact sleep, and they will be able to apply these tools to help manage sleep-related challenges. The purpose of this lesson is to provide patients with a general overview of sleep and outline the importance of quality sleep. Patients will learn about a few of the most common sleep disorders. Patients will also be introduced to the concept of "sleep hygiene," and discover ways to self-manage certain sleeping problems through simple daily behavior changes. Finally, the workshop will motivate patients by clarifying the links between quality sleep and their goals of heart-healthy living.   Recognizing and Reducing Stress Clinical staff led group instruction and group discussion with PowerPoint presentation and patient guidebook. To  enhance the learning environment the use of posters, models and videos may be added. At the conclusion of this workshop, patients will be able to understand the types of stress reactions, differentiate between acute and chronic stress, and recognize the impact that chronic stress has on their health. They will also be able to apply different coping mechanisms, such as reframing negative self-talk. Patients will have the opportunity to practice a variety of stress management techniques, such as deep abdominal breathing, progressive muscle relaxation, and/or guided imagery.  The purpose of this lesson is to educate patients on the role of stress in their lives and to provide healthy techniques for coping with it.  Learning Barriers/Preferences:  Learning Barriers/Preferences - 12/21/23 1053       Learning Barriers/Preferences   Learning Barriers Sight   glasses   Learning Preferences Computer/Internet;Pictoral;Written Material   reading         Education Topics:  Knowledge Questionnaire Score:  Knowledge Questionnaire Score - 12/21/23 1053       Knowledge Questionnaire Score   Pre Score 22/24          Core Components/Risk Factors/Patient Goals at Admission:  Personal Goals  and Risk Factors at Admission - 12/21/23 1055       Core Components/Risk Factors/Patient Goals on Admission    Weight Management Yes    Intervention Weight Management: Develop a combined nutrition and exercise program designed to reach desired caloric intake, while maintaining appropriate intake of nutrient and fiber, sodium and fats, and appropriate energy expenditure required for the weight goal.;Weight Management: Provide education and appropriate resources to help participant work on and attain dietary goals.    Expected Outcomes Short Term: Continue to assess and modify interventions until short term weight is achieved;Long Term: Adherence to nutrition and physical activity/exercise program aimed toward  attainment of established weight goal;Understanding recommendations for meals to include 15-35% energy as protein, 25-35% energy from fat, 35-60% energy from carbohydrates, less than 200mg  of dietary cholesterol, 20-35 gm of total fiber daily;Understanding of distribution of calorie intake throughout the day with the consumption of 4-5 meals/snacks    Hypertension Yes    Intervention Provide education on lifestyle modifcations including regular physical activity/exercise, weight management, moderate sodium restriction and increased consumption of fresh fruit, vegetables, and low fat dairy, alcohol  moderation, and smoking cessation.;Monitor prescription use compliance.    Expected Outcomes Short Term: Continued assessment and intervention until BP is < 140/36mm HG in hypertensive participants. < 130/71mm HG in hypertensive participants with diabetes, heart failure or chronic kidney disease.;Long Term: Maintenance of blood pressure at goal levels.    Lipids Yes    Intervention Provide education and support for participant on nutrition & aerobic/resistive exercise along with prescribed medications to achieve LDL 70mg , HDL >40mg .    Expected Outcomes Long Term: Cholesterol controlled with medications as prescribed, with individualized exercise RX and with personalized nutrition plan. Value goals: LDL < 70mg , HDL > 40 mg.;Short Term: Participant states understanding of desired cholesterol values and is compliant with medications prescribed. Participant is following exercise prescription and nutrition guidelines.    Stress Yes    Intervention Refer participants experiencing significant psychosocial distress to appropriate mental health specialists for further evaluation and treatment. When possible, include family members and significant others in education/counseling sessions.;Offer individual and/or small group education and counseling on adjustment to heart disease, stress management and health-related  lifestyle change. Teach and support self-help strategies.    Expected Outcomes Long Term: Emotional wellbeing is indicated by absence of clinically significant psychosocial distress or social isolation.;Short Term: Participant demonstrates changes in health-related behavior, relaxation and other stress management skills, ability to obtain effective social support, and compliance with psychotropic medications if prescribed.          Core Components/Risk Factors/Patient Goals Review:   Goals and Risk Factor Review     Row Name 01/08/24 1529 01/31/24 1209 03/04/24 1203         Core Components/Risk Factors/Patient Goals Review   Personal Goals Review Weight Management/Obesity;Hypertension;Stress;Lipids Weight Management/Obesity;Hypertension;Stress;Lipids Weight Management/Obesity;Hypertension;Stress;Lipids     Review Edmundo started cardiac rehab 01/02/24. Guage is off to a good start to exercise. Vital signs have been stable. Jabari is doing well with exercise at cardiac rehab. Vital signs have been stable. Justino has increased his met levels Yee is doing well with exercise at cardiac rehab. Vital signs have been stable. Edenilson has increased his met levels. Biran has lost 1.9 kg since starting cardiac rehab. Alejandro will tenatively complete cardiac reha on 03/21/24.     Expected Outcomes Danne will continue to participate in cardiac rehab for exercise, nutrition and lifestyle modificaitons Jonovan will continue to participate in cardiac rehab for exercise,  nutrition and lifestyle modificaitons Zae will continue to participate in cardiac rehab for exercise, nutrition and lifestyle modificaitons        Core Components/Risk Factors/Patient Goals at Discharge (Final Review):   Goals and Risk Factor Review - 03/04/24 1203       Core Components/Risk Factors/Patient Goals Review   Personal Goals Review Weight Management/Obesity;Hypertension;Stress;Lipids    Review Koen is doing well with exercise at  cardiac rehab. Vital signs have been stable. Helio has increased his met levels. Selso has lost 1.9 kg since starting cardiac rehab. Zyion will tenatively complete cardiac reha on 03/21/24.    Expected Outcomes Damond will continue to participate in cardiac rehab for exercise, nutrition and lifestyle modificaitons          ITP Comments:  ITP Comments     Row Name 12/21/23 9077 01/02/24 1406 01/31/24 1206 03/04/24 1153     ITP Comments Dr. Wilbert Bihari medical director. Introduction to pritikin education/intensive cardiac rehab. Initial orientation packet reviewed with patient. 30 Day ITP review.  Raffi started cardiac rehab on 01/02/24 and tolerated exercise well. 30 Day ITP review.  Tywon has good attendance and participation with exercise at cardiac rehab 30 Day ITP review.  Townsend continues to have good attendance and participation with exercise at cardiac rehab. Davell will tenatively complete cardiac rehab on 03/21/24.       Comments: See ITP comments. Hadassah Elpidio Quan RN BSN

## 2024-03-05 ENCOUNTER — Encounter (HOSPITAL_COMMUNITY)
Admission: RE | Admit: 2024-03-05 | Discharge: 2024-03-05 | Disposition: A | Source: Ambulatory Visit | Attending: Cardiology

## 2024-03-05 DIAGNOSIS — Z955 Presence of coronary angioplasty implant and graft: Secondary | ICD-10-CM | POA: Diagnosis not present

## 2024-03-07 ENCOUNTER — Encounter (HOSPITAL_COMMUNITY)

## 2024-03-07 ENCOUNTER — Encounter (HOSPITAL_COMMUNITY): Admission: RE | Admit: 2024-03-07

## 2024-03-07 ENCOUNTER — Telehealth (HOSPITAL_COMMUNITY): Payer: Self-pay | Admitting: *Deleted

## 2024-03-07 NOTE — Telephone Encounter (Signed)
 Adrian [name] left voicemail message informing us  he cannot make it to cardiac rehab today due to needing to travel earlier than expected.

## 2024-03-10 ENCOUNTER — Encounter (HOSPITAL_COMMUNITY)
Admission: RE | Admit: 2024-03-10 | Discharge: 2024-03-10 | Disposition: A | Source: Ambulatory Visit | Attending: Cardiology

## 2024-03-10 DIAGNOSIS — Z955 Presence of coronary angioplasty implant and graft: Secondary | ICD-10-CM | POA: Diagnosis not present

## 2024-03-12 ENCOUNTER — Encounter (HOSPITAL_COMMUNITY)
Admission: RE | Admit: 2024-03-12 | Discharge: 2024-03-12 | Disposition: A | Discharge status: Home / Self Care | Source: Ambulatory Visit | Attending: Cardiology | Admitting: Cardiology

## 2024-03-12 VITALS — Ht 70.0 in | Wt 181.4 lb

## 2024-03-12 DIAGNOSIS — Z955 Presence of coronary angioplasty implant and graft: Secondary | ICD-10-CM | POA: Diagnosis not present

## 2024-03-14 ENCOUNTER — Encounter (HOSPITAL_COMMUNITY)
Admission: RE | Admit: 2024-03-14 | Discharge: 2024-03-14 | Disposition: A | Source: Ambulatory Visit | Attending: Cardiology

## 2024-03-14 DIAGNOSIS — Z955 Presence of coronary angioplasty implant and graft: Secondary | ICD-10-CM

## 2024-03-17 ENCOUNTER — Encounter (HOSPITAL_COMMUNITY)
Admission: RE | Admit: 2024-03-17 | Discharge: 2024-03-17 | Disposition: A | Discharge status: Home / Self Care | Source: Ambulatory Visit | Attending: Cardiology | Admitting: Cardiology

## 2024-03-17 DIAGNOSIS — Z955 Presence of coronary angioplasty implant and graft: Secondary | ICD-10-CM | POA: Diagnosis not present

## 2024-03-19 ENCOUNTER — Encounter (HOSPITAL_COMMUNITY): Admission: RE | Admit: 2024-03-19 | Discharge: 2024-03-19 | Attending: Cardiology

## 2024-03-19 DIAGNOSIS — Z955 Presence of coronary angioplasty implant and graft: Secondary | ICD-10-CM | POA: Diagnosis not present

## 2024-03-21 ENCOUNTER — Encounter (HOSPITAL_COMMUNITY): Admission: RE | Admit: 2024-03-21 | Discharge: 2024-03-21 | Attending: Cardiology

## 2024-03-21 DIAGNOSIS — Z955 Presence of coronary angioplasty implant and graft: Secondary | ICD-10-CM | POA: Diagnosis not present

## 2024-03-21 NOTE — Progress Notes (Signed)
 Discharge Progress Report  Patient Details  Name: Adrian [name] MRN: [id number] Date of Birth: Oct 14, 1950 Referring Provider:   Flowsheet Row INTENSIVE CARDIAC REHAB ORIENT from 12/21/2023 in [facility]  Referring Provider Redell Shallow, MD     Number of Visits: 29  Reason for Discharge:  Patient reached a stable level of exercise. Patient independent in their exercise. Patient has met program and personal goals.  Smoking History:  Tobacco Use History[1]  Diagnosis:  12/04/23 DES LAD  ADL UCSD:   Initial Exercise Prescription:  Initial Exercise Prescription - 12/21/23 1000       Date of Initial Exercise RX and Referring Provider   Date 12/21/23    Referring Provider Redell Shallow, MD    Expected Discharge Date 03/21/24      Recumbant Elliptical   Level 1    RPM 60    Watts 80    Minutes 15    METs 2.3      Track   Laps 12    Minutes 15    METs 2.3      Prescription Details   Frequency (times per week) 3    Duration Progress to 30 minutes of continuous aerobic without signs/symptoms of physical distress      Intensity   THRR 40-80% of Max Heartrate 58-117    Ratings of Perceived Exertion 11-13    Perceived Dyspnea 0-4      Progression   Progression Continue progressive overload as per policy without signs/symptoms or physical distress.      Resistance Training   Training Prescription Yes    Weight 3    Reps 10-15          Discharge Exercise Prescription (Final Exercise Prescription Changes):  Exercise Prescription Changes - 03/21/24 1600       Response to Exercise   Blood Pressure (Admit) 104/68    Blood Pressure (Exit) 102/62    Heart Rate (Admit) 97 bpm    Heart Rate (Exercise) 125 bpm    Heart Rate (Exit) 98 bpm    Rating of Perceived Exertion (Exercise) 11    Symptoms None    Comments Pt graduated from the CRP2 program    Duration Continue with 30 min of aerobic exercise  without signs/symptoms of physical distress.    Intensity THRR unchanged      Progression   Progression Continue to progress workloads to maintain intensity without signs/symptoms of physical distress.    Average METs 3.5      Resistance Training   Weight 5 lbs    Reps 10-15    Time 5 Minutes      Interval Training   Interval Training No      NuStep   Level 7    SPM 109    Minutes 15    METs 4.5      Track   Laps 12    Minutes 15    METs 2.53      Home Exercise Plan   Plans to continue exercise at Home (comment)    Frequency Add 2 additional days to program exercise sessions.    Initial Home Exercises Provided 01/25/24          Functional Capacity:  6 Minute Walk     Row Name 12/21/23 1042 03/12/24 0923       6 Minute Walk   Phase Initial Discharge    Distance 1000 feet 2335 feet  Distance % Change -- 133.5 %    Distance Feet Change -- 1335 ft    Walk Time 6 minutes 6 minutes    # of Rest Breaks 0 0    MPH 1.89 4.42    METS 2.53 4.9    RPE 9 10    Perceived Dyspnea  0 0    VO2 Peak 8.86 17.12    Symptoms Yes (comment) No    Comments no pain. a little dizzy near end, pt stated felt like vertigo and was NPO for bloodwork after appt. resolved once seated pt , given H2O. --    Resting HR 77 bpm 82 bpm    Resting BP 116/68 118/68    Resting Oxygen Saturation  98 % --    Exercise Oxygen Saturation  during 6 min walk 98 % --    Max Ex. HR 106 bpm 115 bpm    Max Ex. BP 154/64 138/58    2 Minute Post BP 138/66 --       Psychological, QOL, Others - Outcomes: PHQ 2/9:    03/21/2024    9:47 AM 12/21/2023   10:41 AM  Depression screen PHQ 2/9  Decreased Interest 0 0  Down, Depressed, Hopeless 0 1  PHQ - 2 Score 0 1  Altered sleeping 0 0  Tired, decreased energy 0 0  Change in appetite 0 1  Feeling bad or failure about yourself  0 0  Trouble concentrating 0 1  Moving slowly or fidgety/restless 0 0  Suicidal thoughts 0 0  PHQ-9 Score 0 3    Difficult doing work/chores Not difficult at all Not difficult at all     Data saved with a previous flowsheet row definition    Quality of Life:  Quality of Life - 03/18/24 1535       Quality of Life Scores   Health/Function Pre 21.68 %    Health/Function Post 27.1 %    Health/Function % Change 25 %    Socioeconomic Pre 27.3 %    Socioeconomic Post 27.17 %    Socioeconomic % Change  -0.48 %    Psych/Spiritual Pre 24.92 %    Psych/Spiritual Post 26.21 %    Psych/Spiritual % Change 5.18 %    Family Pre 23.7 %    Family Post 24.7 %    Family % Change 4.22 %    GLOBAL Pre 23.6 %    GLOBAL Post 26.56 %    GLOBAL % Change 12.54 %          Personal Goals: Goals established at orientation with interventions provided to work toward goal.  Personal Goals and Risk Factors at Admission - 12/21/23 1055       Core Components/Risk Factors/Patient Goals on Admission    Weight Management Yes    Intervention Weight Management: Develop a combined nutrition and exercise program designed to reach desired caloric intake, while maintaining appropriate intake of nutrient and fiber, sodium and fats, and appropriate energy expenditure required for the weight goal.;Weight Management: Provide education and appropriate resources to help participant work on and attain dietary goals.    Expected Outcomes Short Term: Continue to assess and modify interventions until short term weight is achieved;Long Term: Adherence to nutrition and physical activity/exercise program aimed toward attainment of established weight goal;Understanding recommendations for meals to include 15-35% energy as protein, 25-35% energy from fat, 35-60% energy from carbohydrates, less than 200mg  of dietary cholesterol, 20-35 gm of total fiber daily;Understanding of distribution of  calorie intake throughout the day with the consumption of 4-5 meals/snacks    Hypertension Yes    Intervention Provide education on lifestyle modifcations  including regular physical activity/exercise, weight management, moderate sodium restriction and increased consumption of fresh fruit, vegetables, and low fat dairy, alcohol  moderation, and smoking cessation.;Monitor prescription use compliance.    Expected Outcomes Short Term: Continued assessment and intervention until BP is < 140/71mm HG in hypertensive participants. < 130/21mm HG in hypertensive participants with diabetes, heart failure or chronic kidney disease.;Long Term: Maintenance of blood pressure at goal levels.    Lipids Yes    Intervention Provide education and support for participant on nutrition & aerobic/resistive exercise along with prescribed medications to achieve LDL 70mg , HDL >40mg .    Expected Outcomes Long Term: Cholesterol controlled with medications as prescribed, with individualized exercise RX and with personalized nutrition plan. Value goals: LDL < 70mg , HDL > 40 mg.;Short Term: Participant states understanding of desired cholesterol values and is compliant with medications prescribed. Participant is following exercise prescription and nutrition guidelines.    Stress Yes    Intervention Refer participants experiencing significant psychosocial distress to appropriate mental health specialists for further evaluation and treatment. When possible, include family members and significant others in education/counseling sessions.;Offer individual and/or small group education and counseling on adjustment to heart disease, stress management and health-related lifestyle change. Teach and support self-help strategies.    Expected Outcomes Long Term: Emotional wellbeing is indicated by absence of clinically significant psychosocial distress or social isolation.;Short Term: Participant demonstrates changes in health-related behavior, relaxation and other stress management skills, ability to obtain effective social support, and compliance with psychotropic medications if prescribed.            Personal Goals Discharge:  Goals and Risk Factor Review     Row Name 01/08/24 1529 01/31/24 1209 03/04/24 1203 03/26/24 1540       Core Components/Risk Factors/Patient Goals Review   Personal Goals Review Weight Management/Obesity;Hypertension;Stress;Lipids Weight Management/Obesity;Hypertension;Stress;Lipids Weight Management/Obesity;Hypertension;Stress;Lipids Weight Management/Obesity;Hypertension;Stress;Lipids    Review Bow started cardiac rehab 01/02/24. Mahmoud is off to a good start to exercise. Vital signs have been stable. Currie is doing well with exercise at cardiac rehab. Vital signs have been stable. Aidenn has increased his met levels Huntley is doing well with exercise at cardiac rehab. Vital signs have been stable. Hershy has increased his met levels. Gavon has lost 1.9 kg since starting cardiac rehab. Gianpaolo will tenatively complete cardiac reha on 03/21/24. Alexys did well with exercise at cardiac rehab. Vital signs were stable. Devereaux increased his met levels. Shyheim has lost 2.6 kg since starting cardiac rehab. Ivan completed cardiac rehab on 03/21/24.    Expected Outcomes Sutter will continue to participate in cardiac rehab for exercise, nutrition and lifestyle modificaitons Tremond will continue to participate in cardiac rehab for exercise, nutrition and lifestyle modificaitons Dayden will continue to participate in cardiac rehab for exercise, nutrition and lifestyle modificaitons Jarrah will continue to exercise,follow  nutrition and lifestyle modificaitons upon completion of cardiac rehab.       Exercise Goals and Review:  Exercise Goals     Row Name 12/21/23 1049             Exercise Goals   Increase Physical Activity Yes       Intervention Provide advice, education, support and counseling about physical activity/exercise needs.;Develop an individualized exercise prescription for aerobic and resistive training based on initial evaluation findings, risk stratification,  comorbidities and participant's personal goals.  Expected Outcomes Short Term: Attend rehab on a regular basis to increase amount of physical activity.;Long Term: Exercising regularly at least 3-5 days a week.;Long Term: Add in home exercise to make exercise part of routine and to increase amount of physical activity.       Increase Strength and Stamina Yes       Intervention Develop an individualized exercise prescription for aerobic and resistive training based on initial evaluation findings, risk stratification, comorbidities and participant's personal goals.;Provide advice, education, support and counseling about physical activity/exercise needs.       Expected Outcomes Long Term: Improve cardiorespiratory fitness, muscular endurance and strength as measured by increased METs and functional capacity ( );Short Term: Perform resistance training exercises routinely during rehab and add in resistance training at home;Short Term: Increase workloads from initial exercise prescription for resistance, speed, and METs.       Able to understand and use rate of perceived exertion (RPE) scale Yes       Intervention Provide education and explanation on how to use RPE scale       Expected Outcomes Short Term: Able to use RPE daily in rehab to express subjective intensity level;Long Term:  Able to use RPE to guide intensity level when exercising independently       Knowledge and understanding of Target Heart Rate Range (THRR) Yes       Intervention Provide education and explanation of THRR including how the numbers were predicted and where they are located for reference       Expected Outcomes Long Term: Able to use THRR to govern intensity when exercising independently;Short Term: Able to use daily as guideline for intensity in rehab;Short Term: Able to state/look up THRR       Understanding of Exercise Prescription Yes       Intervention Provide education, explanation, and written materials on patient's  individual exercise prescription       Expected Outcomes Short Term: Able to explain program exercise prescription;Long Term: Able to explain home exercise prescription to exercise independently          Exercise Goals Re-Evaluation:  Exercise Goals Re-Evaluation     Row Name 01/02/24 1219 01/30/24 1015 02/27/24 0800 03/21/24 1648       Exercise Goal Re-Evaluation   Exercise Goals Review Increase Physical Activity;Understanding of Exercise Prescription;Increase Strength and Stamina;Knowledge and understanding of Target Heart Rate Range (THRR);Able to understand and use rate of perceived exertion (RPE) scale Increase Physical Activity;Understanding of Exercise Prescription;Increase Strength and Stamina;Knowledge and understanding of Target Heart Rate Range (THRR);Able to understand and use rate of perceived exertion (RPE) scale Increase Physical Activity;Understanding of Exercise Prescription;Increase Strength and Stamina;Knowledge and understanding of Target Heart Rate Range (THRR);Able to understand and use rate of perceived exertion (RPE) scale Increase Physical Activity;Understanding of Exercise Prescription;Increase Strength and Stamina;Knowledge and understanding of Target Heart Rate Range (THRR);Able to understand and use rate of perceived exertion (RPE) scale    Comments Pt's first day in the CRP2 program. Pt understands the exercise Rx, RPE scale and THRR. Reviewed METs and goals. Pt voices progress on his golas of increased strength and stamina. Pt voices he is back to doing many home taska which he was not doing before. Pt is also making progress on the goals of knowning his limits, and improving his knowledge about nutrition. Reviewed METs and goals. Continues to voice progress on his goals of increased strength and stamina. Pt continues making progress on the goals of knowning his limits, and improving  his knowledge about nutrition. Pt graduated from the CRP2 program today. Pt made good  progress on his goals of increased strength and stamina. Pt has a good understanding of his limits usingg THRR and RPE as a guide. Pt voices he has improved his knowlege of nutrition and he and his spouse are eating much better. Pt will continue to his exercise using the elliptical, walking 1 mile a day, [employer] exercises, Tai chi: 5-7x/week for 30-60 minutes.    Expected Outcomes Will continue to monitor the patient and progress exericse workloads as toelrated. Will continue to monitor the patient and progress exericse workloads as toelrated. Will continue to monitor the patient and progress exericse workloads as toelrated. Pt will continue his exercise at home.       Nutrition & Weight - Outcomes:  Pre Biometrics - 12/21/23 0922       Pre Biometrics   Waist Circumference 42.5 inches    Hip Circumference 41 inches    Waist to Hip Ratio 1.04 %    Triceps Skinfold 15 mm    % Body Fat 28.2 %    Grip Strength 23 kg    Flexibility --   not done- back issues   Single Leg Stand 0.2 seconds          Post Biometrics - 03/12/24 0930        Post  Biometrics   Height 5' 10 (1.778 m)    Weight 82.3 kg    Waist Circumference 42 inches    Hip Circumference 41 inches    Waist to Hip Ratio 1.02 %    BMI (Calculated) 26.03    Triceps Skinfold 11 mm    % Body Fat 25.1 %    Grip Strength 27 kg    Single Leg Stand 4 seconds          Nutrition:   Nutrition Discharge:   Education Questionnaire Score:  Knowledge Questionnaire Score - 03/18/24 1536       Knowledge Questionnaire Score   Post Score 24/24          Goals reviewed with patient; copy given to patient.Pt graduated from  Intensive/Traditional cardiac rehab program on 03/21/24 with completion of  31 exercise and  31 education sessions. Pt maintained good attendance and progressed nicely during his participation in rehab as evidenced by increased MET level. Gordy increased his distance on his post exercise walk test by 1335  feet and lost 2.6 kg!   Medication list reconciled. Repeat  PHQ score- 0 .  Pt has made significant lifestyle changes and should be commended for his success. Berk achieved their goals during cardiac rehab.   Pt plans to continue exercise at home walking using his elliptical at home and using hand weights at home. We are proud of Blaiden's progress and weight loss.Hadassah Elpidio Quan RN BSN      [1]  Social History Tobacco Use  Smoking Status Former   Current packs/day: 0.00   Types: Cigarettes   Start date: 04/03/1972   Quit date: 04/03/1973   Years since quitting: 51.0  Smokeless Tobacco Never

## 2024-04-01 ENCOUNTER — Ambulatory Visit: Admitting: Cardiology

## 2024-04-14 NOTE — Progress Notes (Unsigned)
 "    HPI: Follow-up coronary artery disease. Calcium  score March 2020 2190 which was 95th percentile. Abdominal ultrasound July 2023 showed no abdominal aortic aneurysm.  Coronary CTA August 2025 showed calcium  score 3327 (96 percentile) and severe mid LAD stenosis; moderate disease noted in the left circumflex and RCA.  Cardiac catheterization September 2025 showed 50% RCA and PDA, 40% circumflex and 80% LAD.  Patient had PCI of the LAD at that time.  Carotid Doppler September 2025 near normal bilaterally.  Since last seen   Current Outpatient Medications  Medication Sig Dispense Refill   acetaminophen  (TYLENOL ) 500 MG tablet Take 500 mg by mouth every 6 (six) hours as needed for moderate pain.     aspirin  EC 81 MG tablet Take 1 tablet (81 mg total) by mouth 2 (two) times daily. To be taken after surgery 84 tablet 0   Cholecalciferol (VITAMIN D) 50 MCG (2000 UT) CAPS Take 2,000 Units by mouth daily. (Patient not taking: Reported on 03/21/2024)     clopidogrel  (PLAVIX ) 75 MG tablet Take 1 tablet (75 mg total) by mouth daily. 90 tablet 1   Coenzyme Q10 (COQ10) 100 MG CAPS Take 100 mg by mouth daily.     fluticasone  (FLONASE ) 50 MCG/ACT nasal spray Place 2 sprays into both nostrils daily as needed for allergies.     ketoconazole (NIZORAL) 2 % cream Apply 1 Application topically daily as needed for irritation.     methocarbamol  (ROBAXIN ) 500 MG tablet Take 500 mg by mouth every 8 (eight) hours as needed for muscle spasms.     nitroGLYCERIN  (NITROSTAT ) 0.4 MG SL tablet Place 1 tablet (0.4 mg total) under the tongue every 5 (five) minutes as needed for chest pain. 25 tablet 3   olmesartan (BENICAR) 20 MG tablet Take 20 mg by mouth daily.     rosuvastatin  (CRESTOR ) 20 MG tablet Take 2 tablets (40 mg total) by mouth daily.     No current facility-administered medications for this visit.     Past Medical History:  Diagnosis Date   Arthritis    Atypical nevus 04/20/2004   left low back (wider  shave)   Basal cell carcinoma 01/16/1995   left back (cx3 5fu exc)   BCC (basal cell carcinoma of skin) 10/13/2002   left supra brow (cx3 fu exc)   BCC (basal cell carcinoma of skin) 04/20/2004   center upper back (cx3 78fu)   BCC (basal cell carcinoma of skin) 04/24/2006   upper mid back (cx3 64fu)   BCC (basal cell carcinoma of skin) 10/28/2012   left upper back (cx3 89fu)   BCC (basal cell carcinoma of skin) 10/28/2012   right back upper (cx3 55fu)   BCC (basal cell carcinoma of skin) 10/28/2012   right deltoid (cx3 48fu)   BCC (basal cell carcinoma of skin) 10/25/2015   right chest no treatment basaliod neoplasm   DJD (degenerative joint disease)    GERD (gastroesophageal reflux disease)    Hydrocele    Hypertension    Right hydrocele 2021   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   left temple (cx3 60fu)   Seasonal allergies     Past Surgical History:  Procedure Laterality Date   APPENDECTOMY     hemorrhaged post op   BACK SURGERY     CERVICAL DISC SURGERY  04/03/1993   CORONARY LITHOTRIPSY N/A 12/04/2023   Procedure: CORONARY LITHOTRIPSY;  Surgeon: Verlin Lonni BIRCH, MD;  Location: [facility];  Service: Cardiovascular;  Laterality: N/A;   CORONARY PRESSURE/FFR WITH 3D MAPPING N/A 12/04/2023   Procedure: Coronary Pressure/FFR w/3D Mapping;  Surgeon: Verlin Lonni BIRCH, MD;  Location: [facility];  Service: Cardiovascular;  Laterality: N/A;   CORONARY STENT INTERVENTION N/A 12/04/2023   Procedure: CORONARY STENT INTERVENTION;  Surgeon: Verlin Lonni BIRCH, MD;  Location: [facility];  Service: Cardiovascular;  Laterality: N/A;   DIAGNOSTIC LAPAROSCOPY     HERNIA REPAIR     umbilical   HYDROCELE EXCISION Right 07/14/2019   Procedure: HYDROCELECTOMY ADULT;  Surgeon: Ottelin, Mark, MD;  Location: [facility];  Service: Urology;  Laterality: Right;   KNEE ARTHROSCOPY  04/03/2008   lt   LEFT HEART CATH AND CORONARY  ANGIOGRAPHY N/A 12/04/2023   Procedure: LEFT HEART CATH AND CORONARY ANGIOGRAPHY;  Surgeon: Verlin Lonni BIRCH, MD;  Location: [facility];  Service: Cardiovascular;  Laterality: N/A;   LUMBAR LAMINECTOMY  04/03/2006   SEPTOPLASTY     TONSILLECTOMY Bilateral 1970   TOTAL HIP ARTHROPLASTY Right 11/15/2020   Procedure: RIGHT TOTAL HIP ARTHROPLASTY ANTERIOR APPROACH;  Surgeon: Jerri Kay HERO, MD;  Location: [facility];  Service: Orthopedics;  Laterality: Right;  3-C    Social History   Socioeconomic History   Marital status: Married    Spouse name: Not on file   Number of children: Not on file   Years of education: Not on file   Highest education level: Not on file  Occupational History   Not on file  Tobacco Use   Smoking status: Former    Current packs/day: 0.00    Types: Cigarettes    Start date: 04/03/1972    Quit date: 04/03/1973    Years since quitting: 51.0   Smokeless tobacco: Never  Vaping Use   Vaping status: Never Used  Substance and Sexual Activity   Alcohol  use: Not Currently   Drug use: No   Sexual activity: Yes  Other Topics Concern   Not on file  Social History Narrative   Not on file   Social Drivers of Health   Tobacco Use: Medium Risk (12/12/2023)   Patient History    Smoking Tobacco Use: Former    Smokeless Tobacco Use: Never    Passive Exposure: Not on Actuary Strain: Not on file  Food Insecurity: Not on file  Transportation Needs: Not on file  Physical Activity: Not on file  Stress: Not on file  Social Connections: Not on file  Intimate Partner Violence: Not on file  Depression (PHQ2-9): Low Risk (03/21/2024)   Depression (PHQ2-9)    PHQ-2 Score: 0  Alcohol  Screen: Not on file  Housing: Not on file  Utilities: Not on file  Health Literacy: Not on file    Family History  Family history unknown: Yes    ROS: no fevers or chills, productive cough, hemoptysis, dysphasia, odynophagia, melena, hematochezia, dysuria,  hematuria, rash, seizure activity, orthopnea, PND, pedal edema, claudication. Remaining systems are negative.  Physical Exam: Well-developed well-nourished in no acute distress.  Skin is warm and dry.  HEENT is normal.  Neck is supple.  Chest is clear to auscultation with normal expansion.  Cardiovascular exam is regular rate and rhythm.  Abdominal exam nontender or distended. No masses palpated. Extremities show no edema. neuro grossly intact  ECG- personally reviewed  A/P  1 coronary artery disease-status post PCI of the LAD.  Continue aspirin  and Plavix  (will discontinue Plavix  September 2025).  Continue statin.  2 hyperlipidemia-continue  Crestor .  3 hypertension-blood pressure controlled.  Continue present medications.  Redell Shallow, MD    "

## 2024-04-28 ENCOUNTER — Ambulatory Visit: Admitting: Cardiology

## 2024-07-31 ENCOUNTER — Ambulatory Visit: Admitting: Cardiology
